# Patient Record
Sex: Female | Born: 1980 | Race: Black or African American | Hispanic: No | Marital: Single | Smoking: Former smoker
Health system: Southern US, Community
[De-identification: ages and names within clinical notes are randomized; demographics above are authoritative.]

## PROBLEM LIST (undated history)

## (undated) DIAGNOSIS — K589 Irritable bowel syndrome without diarrhea: Secondary | ICD-10-CM

## (undated) DIAGNOSIS — G43909 Migraine, unspecified, not intractable, without status migrainosus: Secondary | ICD-10-CM

## (undated) DIAGNOSIS — M797 Fibromyalgia: Secondary | ICD-10-CM

## (undated) DIAGNOSIS — J45909 Unspecified asthma, uncomplicated: Secondary | ICD-10-CM

## (undated) HISTORY — DX: Fibromyalgia: M79.7

## (undated) HISTORY — PX: OTHER SURGICAL HISTORY: SHX169

## (undated) HISTORY — PX: WISDOM TOOTH EXTRACTION: SHX21

---

## 1998-01-06 ENCOUNTER — Emergency Department (HOSPITAL_COMMUNITY): Admission: EM | Admit: 1998-01-06 | Discharge: 1998-01-06 | Payer: Self-pay | Admitting: Emergency Medicine

## 1998-02-24 ENCOUNTER — Emergency Department (HOSPITAL_COMMUNITY): Admission: EM | Admit: 1998-02-24 | Discharge: 1998-02-24 | Payer: Self-pay

## 1998-03-17 ENCOUNTER — Encounter: Admission: RE | Admit: 1998-03-17 | Discharge: 1998-03-17 | Payer: Self-pay | Admitting: Family Medicine

## 1998-05-01 ENCOUNTER — Emergency Department (HOSPITAL_COMMUNITY): Admission: EM | Admit: 1998-05-01 | Discharge: 1998-05-01 | Payer: Self-pay | Admitting: Emergency Medicine

## 1998-05-21 ENCOUNTER — Encounter: Admission: RE | Admit: 1998-05-21 | Discharge: 1998-05-21 | Payer: Self-pay | Admitting: Family Medicine

## 1998-07-23 ENCOUNTER — Encounter: Admission: RE | Admit: 1998-07-23 | Discharge: 1998-07-23 | Payer: Self-pay | Admitting: Family Medicine

## 1998-11-04 ENCOUNTER — Emergency Department (HOSPITAL_COMMUNITY): Admission: EM | Admit: 1998-11-04 | Discharge: 1998-11-04 | Payer: Self-pay | Admitting: Emergency Medicine

## 1998-11-12 ENCOUNTER — Emergency Department (HOSPITAL_COMMUNITY): Admission: EM | Admit: 1998-11-12 | Discharge: 1998-11-12 | Payer: Self-pay | Admitting: Emergency Medicine

## 1998-11-12 ENCOUNTER — Encounter: Admission: RE | Admit: 1998-11-12 | Discharge: 1998-11-12 | Payer: Self-pay | Admitting: Family Medicine

## 1998-12-03 ENCOUNTER — Encounter: Admission: RE | Admit: 1998-12-03 | Discharge: 1998-12-03 | Payer: Self-pay | Admitting: Family Medicine

## 1999-01-12 ENCOUNTER — Encounter: Admission: RE | Admit: 1999-01-12 | Discharge: 1999-01-12 | Payer: Self-pay | Admitting: Sports Medicine

## 1999-04-28 ENCOUNTER — Emergency Department (HOSPITAL_COMMUNITY): Admission: EM | Admit: 1999-04-28 | Discharge: 1999-04-28 | Payer: Self-pay | Admitting: Emergency Medicine

## 1999-05-05 ENCOUNTER — Encounter: Admission: RE | Admit: 1999-05-05 | Discharge: 1999-05-05 | Payer: Self-pay | Admitting: Family Medicine

## 1999-05-05 ENCOUNTER — Other Ambulatory Visit: Admission: RE | Admit: 1999-05-05 | Discharge: 1999-05-05 | Payer: Self-pay | Admitting: Family Medicine

## 1999-10-26 ENCOUNTER — Emergency Department (HOSPITAL_COMMUNITY): Admission: EM | Admit: 1999-10-26 | Discharge: 1999-10-26 | Payer: Self-pay | Admitting: Emergency Medicine

## 1999-12-15 ENCOUNTER — Emergency Department (HOSPITAL_COMMUNITY): Admission: EM | Admit: 1999-12-15 | Discharge: 1999-12-15 | Payer: Self-pay | Admitting: Emergency Medicine

## 2000-02-21 ENCOUNTER — Emergency Department (HOSPITAL_COMMUNITY): Admission: EM | Admit: 2000-02-21 | Discharge: 2000-02-21 | Payer: Self-pay | Admitting: Emergency Medicine

## 2000-03-31 ENCOUNTER — Encounter (INDEPENDENT_AMBULATORY_CARE_PROVIDER_SITE_OTHER): Payer: Self-pay | Admitting: *Deleted

## 2000-03-31 LAB — CONVERTED CEMR LAB

## 2000-04-21 ENCOUNTER — Encounter: Admission: RE | Admit: 2000-04-21 | Discharge: 2000-04-21 | Payer: Self-pay | Admitting: Family Medicine

## 2001-05-21 ENCOUNTER — Encounter: Admission: RE | Admit: 2001-05-21 | Discharge: 2001-05-21 | Payer: Self-pay | Admitting: Family Medicine

## 2001-06-22 ENCOUNTER — Encounter: Admission: RE | Admit: 2001-06-22 | Discharge: 2001-06-22 | Payer: Self-pay | Admitting: Family Medicine

## 2001-08-10 ENCOUNTER — Ambulatory Visit (HOSPITAL_COMMUNITY): Admission: RE | Admit: 2001-08-10 | Discharge: 2001-08-10 | Payer: Self-pay | Admitting: Gastroenterology

## 2006-01-31 HISTORY — PX: OTHER SURGICAL HISTORY: SHX169

## 2006-03-30 DIAGNOSIS — J309 Allergic rhinitis, unspecified: Secondary | ICD-10-CM | POA: Insufficient documentation

## 2006-03-30 DIAGNOSIS — N921 Excessive and frequent menstruation with irregular cycle: Secondary | ICD-10-CM

## 2006-03-30 DIAGNOSIS — N10 Acute tubulo-interstitial nephritis: Secondary | ICD-10-CM

## 2006-03-30 DIAGNOSIS — J45909 Unspecified asthma, uncomplicated: Secondary | ICD-10-CM | POA: Insufficient documentation

## 2006-03-30 DIAGNOSIS — K589 Irritable bowel syndrome without diarrhea: Secondary | ICD-10-CM

## 2006-03-30 DIAGNOSIS — N946 Dysmenorrhea, unspecified: Secondary | ICD-10-CM

## 2006-03-31 ENCOUNTER — Encounter (INDEPENDENT_AMBULATORY_CARE_PROVIDER_SITE_OTHER): Payer: Self-pay | Admitting: *Deleted

## 2014-06-30 ENCOUNTER — Emergency Department (HOSPITAL_COMMUNITY): Payer: Self-pay

## 2014-06-30 ENCOUNTER — Emergency Department (HOSPITAL_COMMUNITY)
Admission: EM | Admit: 2014-06-30 | Discharge: 2014-06-30 | Disposition: A | Discharge status: Home / Self Care | Payer: Self-pay | Attending: Emergency Medicine | Admitting: Emergency Medicine

## 2014-06-30 ENCOUNTER — Encounter (HOSPITAL_COMMUNITY): Payer: Self-pay | Admitting: Emergency Medicine

## 2014-06-30 DIAGNOSIS — Z3202 Encounter for pregnancy test, result negative: Secondary | ICD-10-CM | POA: Insufficient documentation

## 2014-06-30 DIAGNOSIS — J45901 Unspecified asthma with (acute) exacerbation: Secondary | ICD-10-CM | POA: Insufficient documentation

## 2014-06-30 DIAGNOSIS — Z79899 Other long term (current) drug therapy: Secondary | ICD-10-CM | POA: Insufficient documentation

## 2014-06-30 DIAGNOSIS — K589 Irritable bowel syndrome without diarrhea: Secondary | ICD-10-CM | POA: Insufficient documentation

## 2014-06-30 DIAGNOSIS — Z91040 Latex allergy status: Secondary | ICD-10-CM | POA: Insufficient documentation

## 2014-06-30 DIAGNOSIS — Z8679 Personal history of other diseases of the circulatory system: Secondary | ICD-10-CM | POA: Insufficient documentation

## 2014-06-30 HISTORY — DX: Migraine, unspecified, not intractable, without status migrainosus: G43.909

## 2014-06-30 HISTORY — DX: Irritable bowel syndrome, unspecified: K58.9

## 2014-06-30 HISTORY — DX: Unspecified asthma, uncomplicated: J45.909

## 2014-06-30 LAB — CBC WITH DIFFERENTIAL/PLATELET
Basophils Absolute: 0 10*3/uL (ref 0.0–0.1)
Basophils Relative: 1 % (ref 0–1)
Eosinophils Absolute: 0 10*3/uL (ref 0.0–0.7)
Eosinophils Relative: 1 % (ref 0–5)
HCT: 34.5 % — ABNORMAL LOW (ref 36.0–46.0)
Hemoglobin: 11.7 g/dL — ABNORMAL LOW (ref 12.0–15.0)
Lymphocytes Relative: 42 % (ref 12–46)
Lymphs Abs: 2.8 10*3/uL (ref 0.7–4.0)
MCH: 29.1 pg (ref 26.0–34.0)
MCHC: 33.9 g/dL (ref 30.0–36.0)
MCV: 85.8 fL (ref 78.0–100.0)
Monocytes Absolute: 0.5 10*3/uL (ref 0.1–1.0)
Monocytes Relative: 8 % (ref 3–12)
Neutro Abs: 3.2 10*3/uL (ref 1.7–7.7)
Neutrophils Relative %: 48 % (ref 43–77)
Platelets: 211 10*3/uL (ref 150–400)
RBC: 4.02 MIL/uL (ref 3.87–5.11)
RDW: 12.4 % (ref 11.5–15.5)
WBC: 6.6 10*3/uL (ref 4.0–10.5)

## 2014-06-30 LAB — URINALYSIS, ROUTINE W REFLEX MICROSCOPIC
Bilirubin Urine: NEGATIVE
Glucose, UA: NEGATIVE mg/dL
Hgb urine dipstick: NEGATIVE
Ketones, ur: NEGATIVE mg/dL
Leukocytes, UA: NEGATIVE
Nitrite: NEGATIVE
Protein, ur: NEGATIVE mg/dL
Specific Gravity, Urine: 1.005 (ref 1.005–1.030)
Urobilinogen, UA: 0.2 mg/dL (ref 0.0–1.0)
pH: 7 (ref 5.0–8.0)

## 2014-06-30 LAB — COMPREHENSIVE METABOLIC PANEL
ALT: 32 U/L (ref 14–54)
AST: 26 U/L (ref 15–41)
Albumin: 3.8 g/dL (ref 3.5–5.0)
Alkaline Phosphatase: 46 U/L (ref 38–126)
Anion gap: 8 (ref 5–15)
BUN: 7 mg/dL (ref 6–20)
CO2: 22 mmol/L (ref 22–32)
Calcium: 8.8 mg/dL — ABNORMAL LOW (ref 8.9–10.3)
Chloride: 109 mmol/L (ref 101–111)
Creatinine, Ser: 0.67 mg/dL (ref 0.44–1.00)
GFR calc Af Amer: >60 mL/min (ref >60)
GFR calc non Af Amer: >60 mL/min (ref >60)
Glucose, Bld: 123 mg/dL — ABNORMAL HIGH (ref 65–99)
Potassium: 4 mmol/L (ref 3.5–5.1)
Sodium: 139 mmol/L (ref 135–145)
Total Bilirubin: 0.2 mg/dL — ABNORMAL LOW (ref 0.3–1.2)
Total Protein: 7 g/dL (ref 6.5–8.1)

## 2014-06-30 LAB — PREGNANCY, URINE: Preg Test, Ur: NEGATIVE

## 2014-06-30 LAB — LIPASE, BLOOD: Lipase: 18 U/L — ABNORMAL LOW (ref 22–51)

## 2014-06-30 MED ORDER — PREDNISONE 10 MG PO TABS: 20.0000 mg | ORAL_TABLET | Freq: Every day | ORAL | Status: DC

## 2014-06-30 MED ORDER — ALBUTEROL SULFATE (2.5 MG/3ML) 0.083% IN NEBU
5.0000 mg | INHALATION_SOLUTION | Freq: Once | RESPIRATORY_TRACT | Status: AC
  Administered 2014-06-30: 5 mg via RESPIRATORY_TRACT
  Filled 2014-06-30: qty 6

## 2014-06-30 MED ORDER — HYDROCODONE-HOMATROPINE 5-1.5 MG/5ML PO SYRP: 5.0000 mL | ORAL_SOLUTION | Freq: Four times a day (QID) | ORAL | Status: DC | PRN

## 2014-06-30 MED ORDER — PREDNISONE 20 MG PO TABS
60.0000 mg | ORAL_TABLET | Freq: Once | ORAL | Status: AC
  Administered 2014-06-30: 60 mg via ORAL
  Filled 2014-06-30: qty 3

## 2014-06-30 NOTE — ED Notes (Signed)
Pt c/o cough x 3 days. Pt has hx of asthma and has been using her nebulizer at home. Pt A&Ox4 and ambulatory. Denies chest pain, denies productive cough. Pt also c/o N/V and diarrhea. Pt also c/o generalized abdominal pain.

## 2014-06-30 NOTE — Progress Notes (Signed)
EDCM spoke to patient at bedside. Patient confirms she does not have a pcp or insurance living in [place].  EDCM provided patient with pamphlet to [facility], informed patient of services there and walk in times.  EDCM also provided patient with list of pcps who accept self pay patients, list of discount pharmacies and websites needymeds.org and GoodRX.com for medication assistance, phone number to inquire about the orange card, phone number to inquire about Mediciad, phone number to inquire about the Affordable Care Act, financial resources in the community such as local churches, salvation army, urban ministries, and dental assistance for uninsured patients.  Patient thankful for resources.  No further EDCM needs at this time. 

## 2014-06-30 NOTE — Discharge Instructions (Signed)
Asthma °Asthma is a recurring condition in which the airways tighten and narrow. Asthma can make it difficult to breathe. It can cause coughing, wheezing, and shortness of breath. Asthma episodes, also called asthma attacks, range from minor to life-threatening. Asthma cannot be cured, but medicines and lifestyle changes can help control it. °CAUSES °Asthma is believed to be caused by inherited (genetic) and environmental factors, but its exact cause is unknown. Asthma may be triggered by allergens, lung infections, or irritants in the air. Asthma triggers are different for each person. Common triggers include:  °· Animal dander. °· Dust mites. °· Cockroaches. °· Pollen from trees or grass. °· Mold. °· Smoke. °· Air pollutants such as dust, household cleaners, hair sprays, aerosol sprays, paint fumes, strong chemicals, or strong odors. °· Cold air, weather changes, and winds (which increase molds and pollens in the air). °· Strong emotional expressions such as crying or laughing hard. °· Stress. °· Certain medicines (such as aspirin) or types of drugs (such as beta-blockers). °· Sulfites in foods and drinks. Foods and drinks that may contain sulfites include dried fruit, potato chips, and sparkling grape juice. °· Infections or inflammatory conditions such as the flu, a cold, or an inflammation of the nasal membranes (rhinitis). °· Gastroesophageal reflux disease (GERD). °· Exercise or strenuous activity. °SYMPTOMS °Symptoms may occur immediately after asthma is triggered or many hours later. Symptoms include: °· Wheezing. °· Excessive nighttime or early morning coughing. °· Frequent or severe coughing with a common cold. °· Chest tightness. °· Shortness of breath. °DIAGNOSIS  °The diagnosis of asthma is made by a review of your medical history and a physical exam. Tests may also be performed. These may include: °· Lung function studies. These tests show how much air you breathe in and out. °· Allergy  tests. °· Imaging tests such as X-rays. °TREATMENT  °Asthma cannot be cured, but it can usually be controlled. Treatment involves identifying and avoiding your asthma triggers. It also involves medicines. There are 2 classes of medicine used for asthma treatment:  °· Controller medicines. These prevent asthma symptoms from occurring. They are usually taken every day. °· Reliever or rescue medicines. These quickly relieve asthma symptoms. They are used as needed and provide short-term relief. °Your health care provider will help you create an asthma action plan. An asthma action plan is a written plan for managing and treating your asthma attacks. It includes a list of your asthma triggers and how they may be avoided. It also includes information on when medicines should be taken and when their dosage should be changed. An action plan may also involve the use of a device called a peak flow meter. A peak flow meter measures how well the lungs are working. It helps you monitor your condition. °HOME CARE INSTRUCTIONS  °· Take medicines only as directed by your health care provider. Speak with your health care provider if you have questions about how or when to take the medicines. °· Use a peak flow meter as directed by your health care provider. Record and keep track of readings. °· Understand and use the action plan to help minimize or stop an asthma attack without needing to seek medical care. °· Control your home environment in the following ways to help prevent asthma attacks: °¨ Do not smoke. Avoid being exposed to secondhand smoke. °¨ Change your heating and air conditioning filter regularly. °¨ Limit your use of fireplaces and wood stoves. °¨ Get rid of pests (such as roaches and   mice) and their droppings.  Throw away plants if you see mold on them.  Clean your floors and dust regularly. Use unscented cleaning products.  Try to have someone else vacuum for you regularly. Stay out of rooms while they are  being vacuumed and for a short while afterward. If you vacuum, use a dust mask from a hardware store, a double-layered or microfilter vacuum cleaner bag, or a vacuum cleaner with a HEPA filter.  Replace carpet with wood, tile, or vinyl flooring. Carpet can trap dander and dust.  Use allergy-proof pillows, mattress covers, and box spring covers.  Wash bed sheets and blankets every week in hot water and dry them in a dryer.  Use blankets that are made of polyester or cotton.  Clean bathrooms and kitchens with bleach. If possible, have someone repaint the walls in these rooms with mold-resistant paint. Keep out of the rooms that are being cleaned and painted.  Wash hands frequently. SEEK MEDICAL CARE IF:   You have wheezing, shortness of breath, or a cough even if taking medicine to prevent attacks.  The colored mucus you cough up (sputum) is thicker than usual.  Your sputum changes from clear or white to yellow, green, gray, or bloody.  You have any problems that may be related to the medicines you are taking (such as a rash, itching, swelling, or trouble breathing).  You are using a reliever medicine more than 2-3 times per week.  Your peak flow is still at 50-79% of your personal best after following your action plan for 1 hour.  You have a fever. SEEK IMMEDIATE MEDICAL CARE IF:   You seem to be getting worse and are unresponsive to treatment during an asthma attack.  You are short of breath even at rest.  You get short of breath when doing very little physical activity.  You have difficulty eating, drinking, or talking due to asthma symptoms.  You develop chest pain.  You develop a fast heartbeat.  You have a bluish color to your lips or fingernails.  You are light-headed, dizzy, or faint.  Your peak flow is less than 50% of your personal best. MAKE SURE YOU:   Understand these instructions.  Will watch your condition.  Will get help right away if you are not  doing well or get worse. Document Released: 01/17/2005 Document Revised: 06/03/2013 Document Reviewed: 08/16/2012 [facility] Patient Information 2015 [place], [place]. This information is not intended to replace advice given to you by your health care provider. Make sure you discuss any questions you have with your health care provider. Emergency Department Resource Guide 1) Find a Doctor and Pay Out of Pocket Although you won't have to find out who is covered by your insurance plan, it is a good idea to ask around and get recommendations. You will then need to call the office and see if the doctor you have chosen will accept you as a new patient and what types of options they offer for patients who are self-pay. Some doctors offer discounts or will set up payment plans for their patients who do not have insurance, but you will need to ask so you aren't surprised when you get to your appointment.  2) Contact Your Local Health Department Not all health departments have doctors that can see patients for sick visits, but many do, so it is worth a call to see if yours does. If you don't know where your local health department is, you can check in your phone  book. The CDC also has a tool to help you locate your state's health department, and many state websites also have listings of all of their local health departments.  3) Find a Walk-in Clinic If your illness is not likely to be very severe or complicated, you may want to try a walk in clinic. These are popping up all over the country in pharmacies, drugstores, and shopping centers. They're usually staffed by nurse practitioners or physician assistants that have been trained to treat common illnesses and complaints. They're usually fairly quick and inexpensive. However, if you have serious medical issues or chronic medical problems, these are probably not your best option.   Chronic Pain Problems: Organization         Address     Phone              Notes  [facility]  [phone] Patients need to be referred by their primary care doctor.   Medication Assistance: Organization         Address     Phone             Notes  [facility] Medication [facility] [address] [place], [place] [address] [phone] --Must be a resident of [facility] -- Must have NO insurance coverage whatsoever (no Medicaid/ Medicare, etc.) -- The pt. MUST have a primary care doctor that directs their care regularly and follows them in the community   [facility]  [phone]   [employer]  [phone]    Agencies that provide inexpensive medical care: Organization         Address     Phone             Notes  [facility]  [phone]   [facility]    [phone]   [facility] [address] [place], [place] [address] [phone]   [facility] [address] [place] [place] [phone]   Planned Parenthood    [phone]   [facility]    [phone]   Community Health and [facility]  [address], [place] Phone:  [phone], Fax:  [phone] Hours of Operation:  9 am - 6 pm, M-F.  Also accepts Medicaid/Medicare and self-pay.  [facility]  [address], [place] Phone: [phone], Fax: [phone]. Hours of Operation:  8:30 am - 5:30 pm, M-F.  Also accepts Medicaid and self-pay.  [facility] [place] [address] [place] Phone: [phone]   [facility]     [address] Natasha Bence [place], [place] [phone], Ext. [phone] Mondays & Thursdays: 7-9 AM.  First 15 patients are seen on a first come, first serve basis.   [facility] [address] [place] [address] [phone] Accepts Medicaid   Medicaid-accepting [facility] Providers:  Organization         Address     Phone              Notes  [facility] [address], [place] [phone] Also accepts self-pay patients.  [facility] [address] Laurell Josephs [place], [place]  [phone]   [facility] [address], [place] [phone]   [facility] [address], [place] [phone]) [phone]   Renaye RakersVeita Bland [address], [place]   [phone] Only accepts [place] Access [place] patients after they have their name applied to their card.   Self-Pay (no insurance) in [place]:  Organization         Address     Phone             Notes  Sickle Cell Patients, [facility] [address], [place] [phone]   [facility] [address], [place] [phone]   [facility] [place]  [address],  [phone]   [facility]/Dr. Osei-Bonsu  [address] [place] [address], [place] or [phone], [place] [phone] Phone number for both [place] and [place] locations is the same.  [facility]gent [facility] and [facility] [address], [place] [phone]   [facility] [place] [address] [place] [address], [place] or [address] [phone]   [facility] [address], [place] [phone]) [phone], phone; [phone]) [phone], fax Sees patients 1st and 3rd Saturday of every month.  Must not qualify for public or private insurance (i.e. Medicaid, Medicare, [employer] Health Choice, Veterans' Benefits)  Household income should be no more than 200% of the poverty level The clinic cannot treat you if you are pregnant or think you are pregnant  Sexually transmitted diseases are not treated at the clinic.    Dental Care:  Organization         Address     Phone             Notes  [facility] [address], [place] [phone] Accepts children up to age 36 who are enrolled in [place] or [place] Health Choice; pregnant women with a Medicaid card; and children who have applied for Medicaid or [place] Health Choice, but were declined, whose parents can pay a reduced fee at time of service.  [facility] [place]  [address], [place] [phone] Accepts children up to age 36 who are enrolled in [place] or [place] Health Choice; pregnant women with a Medicaid card; and children who have applied for Medicaid or [place] Health Choice, but were declined, whose parents can pay a reduced fee at time of service.  [facility]  [address], [place] [phone] Patients are seen by appointment only. Walk-ins are not accepted. [facility] will see patients 36 years of age and older. Monday - Tuesday (8am-5pm) Most Wednesdays (8:30-5pm) $30 per visit, cash only  [facility]  [address], [facility] [phone] Patients are seen by appointment only. Walk-ins are not accepted. [facility] will see patients 36 years of age and older. One Wednesday Evening (Monthly: Volunteer Based).  $30 per visit, cash only  [facility]  [phone] for adults; Children under age 854, call Graduate Pediatric Dentistry at [phone]. Children aged 214-14, please call [phone] to request a pediatric application.  Dental services are provided in all areas of dental care including fillings, crowns and bridges, complete and partial dentures, implants, gum treatment, root canals, and extractions. Preventive care is also provided. Treatment is provided to both adults and children. Patients are selected via a lottery  and there is often a waiting list.   [facility] [address], [place]  [phone] [email or web address]   [facility] [address]  [place], [place] [phone], Ext. [phone] Second and Fourth Thursday of each month, opens at 6:30 AM; Clinic ends at 9 AM.  Patients are seen on a first-come first-served basis, and a limited number are seen during each clinic.   [facility]  [address] Ether Griffins [place], [place] [phone]   Eligibility Requirements You must have lived in [place], [place], or [place] counties for at least the last three months.   You cannot be eligible for state or federal sponsored [place], including [employer], [place], or [employer].   You generally cannot be eligible for healthcare insurance through your employer.    How to apply: Eligibility screenings are held every Tuesday and Wednesday afternoon from 1:00 pm until 4:00 pm. You do not need an appointment for the interview!  [facility] [address] [place], [place] [phone]   [facility] Health Department  [phone]   [facility] Health Department  [phone]   [facility] Health Department  [phone]    Behavioral Health Resources in the Community: Intensive Outpatient Programs Organization         Address     Phone             Notes  [facility] [address] [place], [place], [place] [phone]   [facility] Outpatient [address], [place], [place] [phone]   [facility]: [facility] [address] [place], [place]  [phone]   [facility] Mental Health [address]  [place], [place] [phone] or [phone]     Substance Abuse Resources Organization         Address     Phone             Notes  Al[facility] Drug Services  [phone]   [facility]  [phone]   [facility] [place]  [phone]   Floydene Flock  [phone]   Residential & Outpatient Substance Abuse Program  [phone]   Psychological Services Organization         Address     Phone             Notes  [facility] Behavioral  Health  [phone]   [facility]  [phone]   [facility] [address], [place] [phone] or [phone]    Mobile Crisis Teams Organization         Address     Phone             Notes  [facility], Mobile Crisis Care Unit  [phone]   [facility]  [address] [place], [place] [phone]   Doristine Locks [address] [place] [phone]    Self-Help/Support Groups Organization         Address     Phone             Notes  [facility] [place] - variety of support groups  336- I7437963 Call for more information  Narcotics Anonymous (NA), [facility] [address], [employer] [place]  2 meetings at this location   Secretary/administrator  Notes  [facility] [address],    [place]  [phone]   [facility]  [address] [place] [address], [place], [place] [phone]   [facility] [address] [place], [place] [phone] Admissions: 8am-3pm M-F  [facility] [address],    [place], [place] [phone]   [facility] [address] [place], [place], [place] [phone]   [facility] [address],  [place], [place] [phone]   [facility] - Intensive Outpatient [address] Laurell Josephs [address], [place], [place] [phone]   [facility] (Addiction [facility] [address] [place]  [place], [place] [phone] or [phone]   [facility] (RTS) [address] [place], [place] [phone] Accepts Medicaid  [facility] [place] [address]  [place] [phone] Substance Abuse/Addiction Treatment   [facility] Organization         Address     Phone             Notes  [facility]  [phone]   Angie Fava, PhD  [address] Ervin Knack [place], [place]   [phone] or [phone]   [facility] Behavioral   [address] [place], [place] [phone]   [facility] [address], [place], [place] [phone] Insurance/Medicaid/sponsorship through [facility] [address]                                    [place], [place] [phone] Therapy/tele-psych/case  [facility] [address], [place] [phone]    Dr. Lolly Mustache  [phone]   [facility]  [employer] [facility] Dept. 1) [address] [place] 2) [address] [place] 3)  [address], [place] [phone]  [phone]   [facility] Child Abuse Hotline 224-795-8619 or 504-679-6115 (After Hours)     [facility] Resources: Abuse and Neglect Organization         Address     Phone             Notes  Child/Elder Abuse Hotline  438-675-3213   Family Abuse Services  (270)220-5296 24 hour crisis line  [facility]  [phone]   [facility] Domestic Violence Hotline  [phone]    Behavioral Health & Substance Use Organization         Address     Phone             Notes  [employer]   947-458-1010 24 hour crisis line  Advance Access  [address], [place] [phone] Monday- Friday, walk-in,  8am-8pm  RTSA Detoxification & Crisis Stabilization  [phone])[phone]   Alcoholics Anonymous [phone] [place] [employer]  Narcotics Anonymous  [phone]     Health Clinics & Urgent Care Centers Organization         Address     Phone             Notes  [facility] Department  [phone]   [facility]  [phone]   [facility]  [phone]   [facility]  [phone] Uninsured patients meeting eligibility requirement  [facility]  [facility]  [facility]  [phone]      [facility]     Health Center  [phone]      [facility]  [phone]      [facility]   [phone] [facility]     [facility]    Center  [phone]     Additional [facility] Resources Organization         Address     Phone             Notes  [facility]  [phone]   [place]  034-742-5956 Medicaid, Nutrition, Medicine Assistance, Utility Assistance  [facility] [phone]   [place] [facility]  [phone]   [facility]  985-800-4138 [facility] Shelter  [facility] [address]  808-444-3449 Adult & family shelter, food, utility & rent assistance  24 Hour crisis line for those facing homelessness  438-170-8210   [facility] Transit  7324357150 [place], [facility] public transportation system  Homecare Providers  (365) [phone] HIV/AIDS Case Management, FREE HIV SCREEN  Medication Management  (726)783-2007 Ongoing medication assistance for patients meeting eligibility requirements  Medication Drop Box Locations: [place] Police Dept., [facility] Police Dept., [employer] Police Dept., [facility] office  Safely rid of unused medications  [employer]  [phone] Crisis assistance, medication, housing, food, utility assistance  [employer] Info.  [facility])  [phone]

## 2014-06-30 NOTE — ED Provider Notes (Signed)
CSN: [id number]     Arrival date & time 06/30/14  1845 History   First MD Initiated Contact with Patient 06/30/14 1946     Chief Complaint  Patient presents with  . Cough  . Emesis     (Consider location/radiation/quality/duration/timing/severity/associated sxs/prior Treatment) HPI Comments: Patient presents with cough. She has a history of asthma. She states for last 3 days she's had a worsening dry cough. She states the cough keeps her awake at night. She's had some clear rhinorrhea. She denies any fevers. She denies any chest pain. She does have some wheezing and shortness of breath consistent with her asthma exacerbations. She's been using her nebulizer treatment about every 4 hours without improvement of symptoms. She denies any leg pain or swelling. She does not currently have a PCP as she recently moved here from [place].  Patient is a 34 y.o. female presenting with cough and vomiting.  Cough Associated symptoms: rhinorrhea, shortness of breath and wheezing   Associated symptoms: no chest pain, no chills, no diaphoresis, no fever, no headaches and no rash   Emesis Associated symptoms: no abdominal pain, no arthralgias, no chills, no diarrhea and no headaches     Past Medical History  Diagnosis Date  . Asthma   . IBS (irritable bowel syndrome)   . Migraine    History reviewed. No pertinent past surgical history. No family history on file. History  Substance Use Topics  . Smoking status: Never Smoker   . Smokeless tobacco: Not on file  . Alcohol Use: Yes   OB History    No data available     Review of Systems  Constitutional: Positive for fatigue. Negative for fever, chills and diaphoresis.  HENT: Positive for rhinorrhea. Negative for congestion and sneezing.   Eyes: Negative.   Respiratory: Positive for cough, shortness of breath and wheezing. Negative for chest tightness.   Cardiovascular: Negative for chest pain and leg swelling.  Gastrointestinal:  Positive for vomiting. Negative for nausea, abdominal pain, diarrhea and blood in stool.  Genitourinary: Negative for frequency, hematuria, flank pain and difficulty urinating.  Musculoskeletal: Negative for back pain and arthralgias.  Skin: Negative for rash.  Neurological: Negative for dizziness, speech difficulty, weakness, numbness and headaches.      Allergies  Asa and Latex  Home Medications   Prior to Admission medications   Medication Sig Start Date End Date Taking? Authorizing Provider  albuterol (PROVENTIL) (2.5 MG/3ML) 0.083% nebulizer solution Take 2.5 mg by nebulization every 6 (six) hours as needed for wheezing or shortness of breath (wheezing and sob).   Yes Historical Provider, MD  dextromethorphan-guaiFENesin (MUCINEX DM) 30-600 MG per 12 hr tablet Take 1 tablet by mouth every 4 (four) hours as needed for cough (cough).   Yes Historical Provider, MD  escitalopram (LEXAPRO) 10 MG tablet Take 10 mg by mouth daily.   Yes Historical Provider, MD  pantoprazole (PROTONIX) 40 MG tablet Take 40 mg by mouth daily.   Yes Historical Provider, MD  HYDROcodone-homatropine (HYCODAN) 5-1.5 MG/5ML syrup Take 5 mLs by mouth every 6 (six) hours as needed for cough. 06/30/14   Rolan BuccoMelanie Kaydance Bowie, MD  predniSONE (DELTASONE) 10 MG tablet Take 2 tablets (20 mg total) by mouth daily. 06/30/14   Rolan BuccoMelanie Maxmilian Trostel, MD   BP 150/86 mmHg  Pulse 91  Temp(Src) 98.3 F (36.8 C) (Oral)  Resp 16  SpO2 99%  LMP 06/03/2014 Physical Exam  Constitutional: She is oriented to person, place, and time. She appears well-developed and  well-nourished.  HENT:  Head: Normocephalic and atraumatic.  Eyes: Pupils are equal, round, and reactive to light.  Neck: Normal range of motion. Neck supple.  Cardiovascular: Normal rate, regular rhythm and normal heart sounds.   Pulmonary/Chest: Effort normal. No respiratory distress. She has wheezes (mild expiratory wheezing bilaterally). She has no rales. She exhibits no  tenderness.  Abdominal: Soft. Bowel sounds are normal. There is no tenderness. There is no rebound and no guarding.  Musculoskeletal: Normal range of motion. She exhibits no edema.  No edema or calf tenderness  Lymphadenopathy:    She has no cervical adenopathy.  Neurological: She is alert and oriented to person, place, and time.  Skin: Skin is warm and dry. No rash noted.  Psychiatric: She has a normal mood and affect.    ED Course  Procedures (including critical care time) Labs Review Labs Reviewed  CBC WITH DIFFERENTIAL/PLATELET - Abnormal; Notable for the following:    Hemoglobin 11.7 (*)    HCT 34.5 (*)    All other components within normal limits  COMPREHENSIVE METABOLIC PANEL - Abnormal; Notable for the following:    Glucose, Bld 123 (*)    Calcium 8.8 (*)    Total Bilirubin 0.2 (*)    All other components within normal limits  LIPASE, BLOOD - Abnormal; Notable for the following:    Lipase 18 (*)    All other components within normal limits  URINALYSIS, ROUTINE W REFLEX MICROSCOPIC (NOT AT [facility])  PREGNANCY, URINE    Imaging Review Dg Chest 2 View  06/30/2014   CLINICAL DATA:  Cough x4 days  EXAM: CHEST  2 VIEW  COMPARISON:  None.  FINDINGS: Lungs are clear.  No pleural effusion or pneumothorax.  The heart is normal in size.  Visualized osseous structures are within normal limits.  IMPRESSION: Normal chest radiographs.   Electronically Signed   By: Charline Bills M.D.   On: 06/30/2014 21:07     EKG Interpretation None      MDM   Final diagnoses:  Asthma exacerbation    Patient was given nebulizer treatment in the ED. She was given dose of prednisone. Her lungs are clear. Her chest x-rays negative for pneumonia. She has no other symptoms suggestive of pulmonary embolus. She was discharged home in good condition. She was started on a five-day course of prednisone. She can continue to use her nebulizer treatments at home and was given a prescription for Hycodan  cough syrup. She was encouraged to obtain primary care follow-up. She was given a Facilities manager for outpatient follow-up.    Rolan Bucco, MD 06/30/14 2117

## 2014-07-28 ENCOUNTER — Ambulatory Visit: Payer: Self-pay | Attending: Internal Medicine

## 2015-10-28 ENCOUNTER — Other Ambulatory Visit: Payer: Self-pay | Admitting: Obstetrics & Gynecology

## 2015-10-28 DIAGNOSIS — N644 Mastodynia: Secondary | ICD-10-CM

## 2015-11-09 ENCOUNTER — Ambulatory Visit
Admission: RE | Admit: 2015-11-09 | Discharge: 2015-11-09 | Disposition: A | Discharge status: Home / Self Care | Payer: BLUE CROSS/BLUE SHIELD | Source: Ambulatory Visit | Attending: Obstetrics & Gynecology | Admitting: Obstetrics & Gynecology

## 2015-11-09 DIAGNOSIS — N644 Mastodynia: Secondary | ICD-10-CM

## 2015-11-12 ENCOUNTER — Ambulatory Visit: Payer: Self-pay | Admitting: Neurology

## 2015-12-15 ENCOUNTER — Ambulatory Visit: Payer: Self-pay | Admitting: Neurology

## 2016-06-25 ENCOUNTER — Encounter (HOSPITAL_COMMUNITY): Payer: Self-pay | Admitting: *Deleted

## 2016-06-25 ENCOUNTER — Emergency Department (HOSPITAL_COMMUNITY)
Admission: EM | Admit: 2016-06-25 | Discharge: 2016-06-25 | Disposition: A | Discharge status: Home / Self Care | Payer: BLUE CROSS/BLUE SHIELD | Attending: Physician Assistant | Admitting: Physician Assistant

## 2016-06-25 DIAGNOSIS — Z91040 Latex allergy status: Secondary | ICD-10-CM | POA: Insufficient documentation

## 2016-06-25 DIAGNOSIS — Z79899 Other long term (current) drug therapy: Secondary | ICD-10-CM | POA: Insufficient documentation

## 2016-06-25 DIAGNOSIS — K0889 Other specified disorders of teeth and supporting structures: Secondary | ICD-10-CM

## 2016-06-25 DIAGNOSIS — J45909 Unspecified asthma, uncomplicated: Secondary | ICD-10-CM | POA: Insufficient documentation

## 2016-06-25 MED ORDER — IBUPROFEN 800 MG PO TABS: 800.0000 mg | ORAL_TABLET | Freq: Three times a day (TID) | ORAL | 0 refills | Status: DC

## 2016-06-25 MED ORDER — BUPIVACAINE-EPINEPHRINE 0.25% -1:200000 IJ SOLN
10.0000 mL | Freq: Once | INTRAMUSCULAR | Status: DC
  Filled 2016-06-25: qty 10

## 2016-06-25 MED ORDER — BUPIVACAINE-EPINEPHRINE (PF) 0.5% -1:200000 IJ SOLN
1.8000 mL | Freq: Once | INTRAMUSCULAR | Status: AC
  Administered 2016-06-25: 1.8 mL
  Filled 2016-06-25: qty 1.8

## 2016-06-25 NOTE — ED Notes (Signed)
Declined W/C at D/C and was escorted to lobby by RN. 

## 2016-06-25 NOTE — ED Triage Notes (Signed)
To ED for eval of left lower tooth pain. States she states has a cracked tooth. States pain started Wednesday but thought pain would get better. No swelling noted. Pt tearful.

## 2016-06-25 NOTE — ED Provider Notes (Signed)
[facility]-EMERGENCY DEPT Provider Note   CSN: [id number] Arrival date & time: 06/25/16  1103  By signing my name below, I, Melissa [name], attest that this documentation has been prepared under the direction and in the presence of Melissa [name]. Melissa Haberl, PA-C. Electronically Signed: Diona BrownerJennifer [name], ED Scribe. 06/25/16. 11:50 AM.  History   Chief Complaint Chief Complaint  Patient presents with  . Dental Pain    HPI Melissa [name] is [name] 36 y.o. female with [name] PMHx of IBS, asthma and migraines who presents to the Emergency Department complaining of gradually worsening, piercing, constant, left upper tooth pain that started on Wednesday, 06/22/16. Might have bitten into something, but is not sure exactly. Pt states she has [name] cracked tooth. No modifying factors. She hasn't eaten much since onset. She was supposed to have it pulled by her dentist, but she wanted to let the right side of her mouth heal from all the dental work before fixing the left side. Associated sx include sleep disturbance, left sided facial pain, and chills. No swelling. She tried taking ibuprofen with no relief. Pt takes escitalopram and supplements at home. She is allergic to latex and Asprin. Pt denies facial swelling, HA, and fever.  The history is provided by the patient. No language interpreter was used.    Past Medical History:  Diagnosis Date  . Asthma   . IBS (irritable bowel syndrome)   . Migraine     Patient Active Problem List   Diagnosis Date Noted  . RHINITIS, ALLERGIC 03/30/2006  . ASTHMA, UNSPECIFIED 03/30/2006  . IRRITABLE BOWEL SYNDROME 03/30/2006  . PYELONEPHRITIS, ACUTE 03/30/2006  . MENSTRUATION, PAINFUL 03/30/2006  . METRORRHAGIA 03/30/2006    History reviewed. No pertinent surgical history.  OB History    No data available       Home Medications    Prior to Admission medications   Medication Sig Start Date End Date Taking? Authorizing Provider  albuterol (PROVENTIL) (2.5 MG/3ML) 0.083%  nebulizer solution Take 2.5 mg by nebulization every 6 (six) hours as needed for wheezing or shortness of breath (wheezing and sob).    [provider]  dextromethorphan-guaiFENesin (MUCINEX DM) 30-600 MG per 12 hr tablet Take 1 tablet by mouth every 4 (four) hours as needed for cough (cough).    [provider]  escitalopram (LEXAPRO) 10 MG tablet Take 10 mg by mouth daily.    [provider]  HYDROcodone-homatropine (HYCODAN) 5-1.5 MG/5ML syrup Take 5 mLs by mouth every 6 (six) hours as needed for cough. 06/30/14   Rolan BuccoBelfi, Melanie, MD  ibuprofen (ADVIL,MOTRIN) 800 MG tablet Take 1 tablet (800 mg total) by mouth 3 (three) times daily. 06/25/16   Melissa Whan A, PA-C  pantoprazole (PROTONIX) 40 MG tablet Take 40 mg by mouth daily.    [provider]  predniSONE (DELTASONE) 10 MG tablet Take 2 tablets (20 mg total) by mouth daily. 06/30/14   Rolan BuccoBelfi, Melanie, MD    Family History No family history on file.  Social History Social History  Substance Use Topics  . Smoking status: Never Smoker  . Smokeless tobacco: Never Used  . Alcohol use Yes     Allergies   Asa [aspirin] and Latex   Review of Systems Review of Systems  Constitutional: Positive for chills. Negative for activity change and fever.  HENT: Positive for dental problem. Negative for ear pain and facial swelling.   Respiratory: Negative for shortness of breath.   Cardiovascular: Negative for chest pain.  Gastrointestinal: Negative for  abdominal pain.  Musculoskeletal: Negative for back pain.  Skin: Negative for rash.  Neurological: Negative for headaches.  Psychiatric/Behavioral: Positive for sleep disturbance.    Physical Exam Updated Vital Signs BP (!) 143/106 (BP Location: Left Arm)   Pulse 99   Temp 99.1 F (37.3 C) (Oral)   Resp 20   Ht 5\' 7"  (1.702 m)   Wt 76.2 kg (168 lb)   SpO2 99%   BMI 26.31 kg/m   Physical Exam  Constitutional: No distress.  HENT:  Head:  Normocephalic.  Mouth/Throat: Oropharynx is clear and moist.    Eyes: Conjunctivae are normal.  Neck: Neck supple.  Cardiovascular: Normal rate, regular rhythm and normal heart sounds.  Exam reveals no gallop and no friction rub.   No murmur heard. Pulmonary/Chest: Effort normal and breath sounds normal. No respiratory distress.  Abdominal: Soft. She exhibits no distension.  Neurological: She is alert.  Skin: Skin is warm. No rash noted.  Psychiatric: Her behavior is normal.  Nursing note and vitals reviewed.    ED Treatments / Results  DIAGNOSTIC STUDIES: Oxygen Saturation is 99% on RA, normal by my interpretation.   COORDINATION OF CARE: 11:49 AM-Discussed next steps with pt which includes taking ibuprofen and trying [name] dental block. Pt is to follow up with dentist. Pt verbalized understanding and is agreeable with the plan.   Labs (all labs ordered are listed, but only abnormal results are displayed) Labs Reviewed - No data to display  EKG  EKG Interpretation None       Radiology No results found.  Procedures Dental Block Date/Time: 06/25/2016 12:38 PM Performed by: Melissa [name], Melissa [name] Authorized by: Melissa [name]   Consent:    Consent obtained:  Verbal   Consent given by:  Patient   Risks discussed:  Allergic reaction, unsuccessful block, pain and swelling   Alternatives discussed:  No treatment and referral Indications:    Indications: dental pain   Location:    Anesthesia block type: infraorbital. Procedure details (see MAR for exact dosages):    Needle gauge:  25 G   Anesthetic injected:  Bupivacaine 0.5% WITH epi   Injection procedure:  Anatomic landmarks identified, introduced needle, incremental injection, negative aspiration for blood and anatomic landmarks palpated Post-procedure details:    Outcome:  Anesthesia achieved   Patient tolerance of procedure:  Tolerated well, no immediate complications    (including critical care time)  Medications  Ordered in ED Medications  bupivacaine-epinephrine (MARCAINE W/ EPI) 0.5% -1:200000 injection 1.8 mL (1.8 mLs Infiltration Given 06/25/16 1208)     Initial Impression / Assessment and Plan / ED Course  I have reviewed the triage vital signs and the nursing notes.  Pertinent labs & imaging results that were available during my care of the patient were reviewed by me and considered in my medical decision making (see chart for details).     Patient with toothache.  No gross abscess.  Exam unconcerning for Ludwig's angina or spread of infection.  Dental block successfully performed. Antibiotics are not indicated at this time. Urged patient to follow-up with dentist.    Final Clinical Impressions(s) / ED Diagnoses   Final diagnoses:  Pain, dental    New Prescriptions Discharge Medication List as of 06/25/2016 12:36 PM    START taking these medications   Details  ibuprofen (ADVIL,MOTRIN) 800 MG tablet Take 1 tablet (800 mg total) by mouth 3 (three) times daily., Starting Sat 06/25/2016, Print       I personally  performed the services described in this documentation, which was scribed in my presence. The recorded information has been reviewed and is accurate.     Barkley Boards, PA-C 06/28/16 1140    Abelino Derrick, MD 06/30/16 [phone]

## 2016-06-25 NOTE — Discharge Instructions (Signed)
Please call Dr. Lucky CowboyKnox as soon as possible to schedule an appointment. Eating very hot and cold foods may worsen your main until the tooth is repaired. If you develop a fever, chills, difficulty swallowing or swelling around the tooth, you can return to the Emergency Department for re-evaluation.

## 2016-07-02 ENCOUNTER — Emergency Department (HOSPITAL_COMMUNITY): Payer: BLUE CROSS/BLUE SHIELD

## 2016-07-02 ENCOUNTER — Encounter (HOSPITAL_COMMUNITY): Payer: Self-pay | Admitting: Emergency Medicine

## 2016-07-02 ENCOUNTER — Emergency Department (HOSPITAL_COMMUNITY)
Admission: EM | Admit: 2016-07-02 | Discharge: 2016-07-02 | Disposition: A | Discharge status: Home / Self Care | Payer: BLUE CROSS/BLUE SHIELD | Attending: Emergency Medicine | Admitting: Emergency Medicine

## 2016-07-02 DIAGNOSIS — S61210A Laceration without foreign body of right index finger without damage to nail, initial encounter: Secondary | ICD-10-CM | POA: Insufficient documentation

## 2016-07-02 DIAGNOSIS — Y999 Unspecified external cause status: Secondary | ICD-10-CM | POA: Insufficient documentation

## 2016-07-02 DIAGNOSIS — S60021A Contusion of right index finger without damage to nail, initial encounter: Secondary | ICD-10-CM | POA: Insufficient documentation

## 2016-07-02 DIAGNOSIS — W231XXA Caught, crushed, jammed, or pinched between stationary objects, initial encounter: Secondary | ICD-10-CM | POA: Insufficient documentation

## 2016-07-02 DIAGNOSIS — Z91040 Latex allergy status: Secondary | ICD-10-CM | POA: Insufficient documentation

## 2016-07-02 DIAGNOSIS — Z79899 Other long term (current) drug therapy: Secondary | ICD-10-CM | POA: Insufficient documentation

## 2016-07-02 DIAGNOSIS — Y929 Unspecified place or not applicable: Secondary | ICD-10-CM | POA: Insufficient documentation

## 2016-07-02 DIAGNOSIS — Y939 Activity, unspecified: Secondary | ICD-10-CM | POA: Insufficient documentation

## 2016-07-02 DIAGNOSIS — J45909 Unspecified asthma, uncomplicated: Secondary | ICD-10-CM | POA: Insufficient documentation

## 2016-07-02 MED ORDER — LIDOCAINE HCL (PF) 1 % IJ SOLN
5.0000 mL | Freq: Once | INTRAMUSCULAR | Status: AC
  Administered 2016-07-02: 5 mL
  Filled 2016-07-02: qty 5

## 2016-07-02 MED ORDER — BACITRACIN ZINC 500 UNIT/GM EX OINT: TOPICAL_OINTMENT | Freq: Two times a day (BID) | CUTANEOUS | Status: DC

## 2016-07-02 MED ORDER — TETANUS-DIPHTH-ACELL PERTUSSIS 5-2.5-18.5 LF-MCG/0.5 IM SUSP
0.5000 mL | Freq: Once | INTRAMUSCULAR | Status: AC
  Administered 2016-07-02: 0.5 mL via INTRAMUSCULAR
  Filled 2016-07-02: qty 0.5

## 2016-07-02 NOTE — ED Triage Notes (Signed)
Closed right index finger in car door-- small laceration-- hurts to bend

## 2016-07-02 NOTE — ED Provider Notes (Signed)
[facility]PT Provider Note   CSN: [id number] Arrival date & time: 07/02/16  1830  By signing my name below, I, Melissa [name], attest that this documentation has been prepared under the direction and in the presence of Melissa BuffaloHope Octavis Sheeler, NP.  Electronically Signed: Vista Minkobert [name], ED Scribe. 07/02/16. 7:41 PM.  History   Chief Complaint Chief Complaint  Patient presents with  . finger lac    HPI HPI Comments: Melissa [name] is a 36 y.o. female, with no significant PMHx, who presents to the Emergency Department s/p an injury that occurred just prior to arrival. Pt was getting out of a car and accidentally dragged some of the seatbelt with her. She went to close the door and tried to return the seatbelt before the door closed. She caught her right index finger in the car door. Pt has a small laceration to the right index finger. She is able to move the finger but with increased difficulty due to pain. Bleeding is currently controlled with bandage. No numbness or tingling in the extremity.   The history is provided by the patient. No language interpreter was used.    Past Medical History:  Diagnosis Date  . Asthma   . IBS (irritable bowel syndrome)   . Migraine     Patient Active Problem List   Diagnosis Date Noted  . RHINITIS, ALLERGIC 03/30/2006  . ASTHMA, UNSPECIFIED 03/30/2006  . IRRITABLE BOWEL SYNDROME 03/30/2006  . PYELONEPHRITIS, ACUTE 03/30/2006  . MENSTRUATION, PAINFUL 03/30/2006  . METRORRHAGIA 03/30/2006    History reviewed. No pertinent surgical history.  OB History    No data available       Home Medications    Prior to Admission medications   Medication Sig Start Date End Date Taking? Authorizing Provider  albuterol (PROVENTIL) (2.5 MG/3ML) 0.083% nebulizer solution Take 2.5 mg by nebulization every 6 (six) hours as needed for wheezing or shortness of breath (wheezing and sob).    [provider]  dextromethorphan-guaiFENesin (MUCINEX DM) 30-600 MG per  12 hr tablet Take 1 tablet by mouth every 4 (four) hours as needed for cough (cough).    [provider]  escitalopram (LEXAPRO) 10 MG tablet Take 10 mg by mouth daily.    [provider]  HYDROcodone-homatropine (HYCODAN) 5-1.5 MG/5ML syrup Take 5 mLs by mouth every 6 (six) hours as needed for cough. 06/30/14   Melissa [name], Melanie, MD  ibuprofen (ADVIL,MOTRIN) 800 MG tablet Take 1 tablet (800 mg total) by mouth 3 (three) times daily. 06/25/16   [name], Melissa A, PA-C  pantoprazole (PROTONIX) 40 MG tablet Take 40 mg by mouth daily.    [provider]  predniSONE (DELTASONE) 10 MG tablet Take 2 tablets (20 mg total) by mouth daily. 06/30/14   Melissa [name], Melanie, MD    Family History No family history on file.  Social History Social History  Substance Use Topics  . Smoking status: Never Smoker  . Smokeless tobacco: Never Used  . Alcohol use Yes     Allergies   Asa [aspirin] and Latex   Review of Systems Review of Systems  Gastrointestinal: Negative for nausea and vomiting.  Musculoskeletal: Positive for arthralgias.  Skin: Positive for wound (right index finger).  Neurological: Negative for numbness.     Physical Exam Updated Vital Signs BP 131/89 (BP Location: Left Arm)   Pulse 89   Temp 98.6 F (37 C) (Oral)   Resp 16   Ht 5\' 6"  (1.676 m)   Wt 76.2 kg (168 lb)  LMP 06/14/2016   SpO2 100%   BMI 27.12 kg/m   Physical Exam  Constitutional: She is oriented to person, place, and time. She appears well-developed and well-nourished. No distress.  HENT:  Head: Normocephalic and atraumatic.  Eyes: EOM are normal.  Neck: Neck supple.  Cardiovascular: Normal rate.   Pulmonary/Chest: Effort normal.  Musculoskeletal: Normal range of motion.  Neurological: She is alert and oriented to person, place, and time. No cranial nerve deficit.  Skin: Skin is warm and dry.  2cm laceration to right index finger   Psychiatric: She has a normal mood and affect.    Nursing note and vitals reviewed.    ED Treatments / Results  DIAGNOSTIC STUDIES: Oxygen Saturation is 100% on RA, normal by my interpretation.  COORDINATION OF CARE: 7:43 PM-Will irrigate wound and reassess. Discussed treatment plan with pt at bedside and pt agreed to plan.  8:31 PM- Wound was irrigated successfully. Majority superficial. Pt already taking abx for a dental problem so she will continue using.    Radiology Dg Finger Index Right  Result Date: 07/02/2016 CLINICAL DATA:  Slammed index finger in car door today. Index finger pain and unable to bend. Initial encounter. EXAM: RIGHT INDEX FINGER 2+V COMPARISON:  None. FINDINGS: There is no evidence of fracture or dislocation. There is no evidence of arthropathy or other focal bone abnormality. Mild soft tissue swelling seen along the dorsal aspect of the middle phalanx. No evidence of radiopaque foreign body. IMPRESSION: Mild dorsal soft tissue swelling. No evidence of fracture or radiopaque foreign body. Electronically Signed   By: Myles Rosenthal M.D.   On: 07/02/2016 19:33    Procedures  Wound cleaned with NSS with peroxide, anesthestized with lidocaine 1% without epi. Irrigated with NSS, wound left open, bacitracin ointment and dressing applied. Splint for comfort.  Patient will continue antibiotics she is currently taking for dental procedure. Return precautions discussed.  Procedures (including critical care time)  Medications Ordered in ED Medications  bacitracin ointment (not administered)  lidocaine (PF) (XYLOCAINE) 1 % injection 5 mL (5 mLs Infiltration Given 07/02/16 2004)  Tdap (BOOSTRIX) injection 0.5 mL (0.5 mLs Intramuscular Given 07/02/16 2004)     Initial Impression / Assessment and Plan / ED Course  I have reviewed the triage vital signs and the nursing notes.  Pertinent imaging results that were available during my care of the patient were reviewed by me and considered in my medical decision making (see chart  for details).   Final Clinical Impressions(s) / ED Diagnoses  Tdap booster given.Pressure irrigation performed. Laceration occurred < 8 hours prior. Pt has no co morbidities to effect normal wound healing. Discussed home care w pt and answered questions. She will continue taking her abx for dental procedure. Pt is hemodynamically stable with no focal neuro deficits no complaints prior to dc. Pt given return precautions and pt voiced understanding.    Final diagnoses:  Laceration of right index finger without foreign body without damage to nail, initial encounter  Contusion of right index finger without damage to nail, initial encounter    New Prescriptions New Prescriptions   No medications on file  I personally performed the services described in this documentation, which was scribed in my presence. The recorded information has been reviewed and is accurate.    Melissa Buffalo [place], [place] 07/02/16 2105    Maia Plan, MD 07/03/16 [phone]

## 2016-07-02 NOTE — Discharge Instructions (Signed)
Wear the splint for comfort. Continue the antibiotics you are currently taking. Return for any signs of infection.

## 2016-07-02 NOTE — ED Notes (Signed)
Pt's finger soaking in saline and peroxide

## 2016-08-15 ENCOUNTER — Emergency Department (HOSPITAL_COMMUNITY)
Admission: EM | Admit: 2016-08-15 | Discharge: 2016-08-15 | Disposition: A | Discharge status: Home / Self Care | Payer: Self-pay | Attending: Emergency Medicine | Admitting: Emergency Medicine

## 2016-08-15 ENCOUNTER — Emergency Department (HOSPITAL_COMMUNITY): Payer: Self-pay

## 2016-08-15 ENCOUNTER — Encounter (HOSPITAL_COMMUNITY): Payer: Self-pay | Admitting: Emergency Medicine

## 2016-08-15 DIAGNOSIS — J45909 Unspecified asthma, uncomplicated: Secondary | ICD-10-CM | POA: Insufficient documentation

## 2016-08-15 DIAGNOSIS — J189 Pneumonia, unspecified organism: Secondary | ICD-10-CM

## 2016-08-15 DIAGNOSIS — Z791 Long term (current) use of non-steroidal anti-inflammatories (NSAID): Secondary | ICD-10-CM | POA: Insufficient documentation

## 2016-08-15 DIAGNOSIS — Z91040 Latex allergy status: Secondary | ICD-10-CM | POA: Insufficient documentation

## 2016-08-15 DIAGNOSIS — J181 Lobar pneumonia, unspecified organism: Secondary | ICD-10-CM | POA: Insufficient documentation

## 2016-08-15 DIAGNOSIS — Z79899 Other long term (current) drug therapy: Secondary | ICD-10-CM | POA: Insufficient documentation

## 2016-08-15 DIAGNOSIS — R05 Cough: Secondary | ICD-10-CM | POA: Insufficient documentation

## 2016-08-15 LAB — CBC
HCT: 38.2 % (ref 36.0–46.0)
Hemoglobin: 12.8 g/dL (ref 12.0–15.0)
MCH: 29 pg (ref 26.0–34.0)
MCHC: 33.5 g/dL (ref 30.0–36.0)
MCV: 86.4 fL (ref 78.0–100.0)
PLATELETS: 228 10*3/uL (ref 150–400)
RBC: 4.42 MIL/uL (ref 3.87–5.11)
RDW: 12.7 % (ref 11.5–15.5)
WBC: 8.3 10*3/uL (ref 4.0–10.5)

## 2016-08-15 LAB — BASIC METABOLIC PANEL
Anion gap: 10 (ref 5–15)
BUN: 5 mg/dL — AB (ref 6–20)
CALCIUM: 9.4 mg/dL (ref 8.9–10.3)
CO2: 25 mmol/L (ref 22–32)
CREATININE: 0.87 mg/dL (ref 0.44–1.00)
Chloride: 98 mmol/L — ABNORMAL LOW (ref 101–111)
Glucose, Bld: 123 mg/dL — ABNORMAL HIGH (ref 65–99)
Potassium: 3.3 mmol/L — ABNORMAL LOW (ref 3.5–5.1)
SODIUM: 133 mmol/L — AB (ref 135–145)

## 2016-08-15 LAB — D-DIMER, QUANTITATIVE (NOT AT ARMC): D DIMER QUANT: 0.36 ug{FEU}/mL (ref 0.00–0.50)

## 2016-08-15 MED ORDER — AZITHROMYCIN 250 MG PO TABS: 250.0000 mg | ORAL_TABLET | Freq: Every day | ORAL | 0 refills | Status: DC

## 2016-08-15 MED ORDER — ALBUTEROL SULFATE (2.5 MG/3ML) 0.083% IN NEBU
INHALATION_SOLUTION | RESPIRATORY_TRACT | Status: AC
  Filled 2016-08-15: qty 6

## 2016-08-15 MED ORDER — AZITHROMYCIN 250 MG PO TABS
500.0000 mg | ORAL_TABLET | Freq: Once | ORAL | Status: AC
  Administered 2016-08-15: 500 mg via ORAL
  Filled 2016-08-15: qty 2

## 2016-08-15 MED ORDER — BENZONATATE 100 MG PO CAPS: 100.0000 mg | ORAL_CAPSULE | Freq: Three times a day (TID) | ORAL | 0 refills | Status: DC

## 2016-08-15 MED ORDER — BENZONATATE 100 MG PO CAPS
200.0000 mg | ORAL_CAPSULE | Freq: Once | ORAL | Status: AC
  Administered 2016-08-15: 200 mg via ORAL
  Filled 2016-08-15: qty 2

## 2016-08-15 MED ORDER — POTASSIUM CHLORIDE CRYS ER 20 MEQ PO TBCR
40.0000 meq | EXTENDED_RELEASE_TABLET | Freq: Once | ORAL | Status: AC
  Administered 2016-08-15: 40 meq via ORAL
  Filled 2016-08-15: qty 2

## 2016-08-15 MED ORDER — ALBUTEROL SULFATE (2.5 MG/3ML) 0.083% IN NEBU
5.0000 mg | INHALATION_SOLUTION | Freq: Once | RESPIRATORY_TRACT | Status: AC
  Administered 2016-08-15: 5 mg via RESPIRATORY_TRACT

## 2016-08-15 NOTE — Discharge Instructions (Signed)
Use your albuterol every 4 hours as needed for shortness of breath. Return if needed more than every 4 hours. Take the cough medicine as needed. Make sure that you finish the antibiotic. Call any of the numbers listed to get a primary care physician if not feeling better in a week. It is okay to take Tylenol as directed for pain or fever. Return if concern for any reason

## 2016-08-15 NOTE — ED Provider Notes (Signed)
[facility]-EMERGENCY DEPT Provider Note   CSN: [id number] Arrival date & time: 08/15/16  1521  By signing my name below, I, Melissa [name], attest that this documentation has been prepared under the direction and in the presence of Doug Sou, MD. Electronically Signed: Karren Cobble, ED Scribe. 08/15/16. 6:26 PM.  History   Chief Complaint Chief Complaint  Patient presents with  . Shortness of Breath  . Cough   The history is provided by the patient. No language interpreter was used.   HPI Comments: Melissa [name] is a 36 y.o. female with a history of asthma, who presents to the Emergency Department complaining of sudden onset, gradually worsening cough that began two days ago. She notes associated hemoptysis, shortness of breath, chest pain secondary to her cough, subjective fever and chills. Pt reports two days ago when she began coughing she was coughing up blood. At this time she reports she has no production with her cough. She denies green or yellow phlegm production. Has a hx of Pneumonia and states she had to have "fluid removed from her lungs over 10 years ago, due to being given too much saline". She is currently on Lexapro and Albuterol. No tobacco or illicit drug usage.Occasional alcohol usage. Denies emesis. No other acute associated symptoms noted at this time. No treatment prior to coming here. She's been treated with albuterol improvement of breathing. Past Medical History:  Diagnosis Date  . Asthma   . IBS (irritable bowel syndrome)   . Migraine    Patient Active Problem List   Diagnosis Date Noted  . RHINITIS, ALLERGIC 03/30/2006  . ASTHMA, UNSPECIFIED 03/30/2006  . IRRITABLE BOWEL SYNDROME 03/30/2006  . PYELONEPHRITIS, ACUTE 03/30/2006  . MENSTRUATION, PAINFUL 03/30/2006  . METRORRHAGIA 03/30/2006   History reviewed. No pertinent surgical history.  OB History    No data available     Home Medications    Prior to Admission medications   Medication Sig Start Date  End Date Taking? Authorizing Provider  albuterol (PROVENTIL) (2.5 MG/3ML) 0.083% nebulizer solution Take 2.5 mg by nebulization every 6 (six) hours as needed for wheezing or shortness of breath (wheezing and sob).    [provider]  dextromethorphan-guaiFENesin (MUCINEX DM) 30-600 MG per 12 hr tablet Take 1 tablet by mouth every 4 (four) hours as needed for cough (cough).    [provider]  escitalopram (LEXAPRO) 10 MG tablet Take 10 mg by mouth daily.    [provider]  HYDROcodone-homatropine (HYCODAN) 5-1.5 MG/5ML syrup Take 5 mLs by mouth every 6 (six) hours as needed for cough. 06/30/14   Rolan Bucco, MD  ibuprofen (ADVIL,MOTRIN) 800 MG tablet Take 1 tablet (800 mg total) by mouth 3 (three) times daily. 06/25/16   McDonald, Mia A, PA-C  pantoprazole (PROTONIX) 40 MG tablet Take 40 mg by mouth daily.    [provider]  predniSONE (DELTASONE) 10 MG tablet Take 2 tablets (20 mg total) by mouth daily. 06/30/14   Rolan Bucco, MD   Family History No family history on file.  Social History Social History  Substance Use Topics  . Smoking status: Never Smoker  . Smokeless tobacco: Never Used  . Alcohol use Yes   Allergies   Asa [aspirin] and Latex  Review of Systems Review of Systems  Constitutional: Positive for chills and fever.       Subjective fever  HENT: Negative.   Respiratory: Positive for cough and shortness of breath.   Cardiovascular: Positive for chest pain.  Chest pain with cough  Gastrointestinal: Negative.   Musculoskeletal: Negative.   Skin: Negative.   Neurological: Negative.   Psychiatric/Behavioral: Negative.   All other systems reviewed and are negative.    Physical Exam Updated Vital Signs BP (!) 125/95   Pulse 91   Temp 99.1 F (37.3 C) (Oral)   Resp 20   Ht 5\' 7"  (1.702 m)   Wt 175 lb 6.4 oz (79.6 kg)   LMP 07/22/2016   SpO2 98%   BMI 27.47 kg/m   Physical Exam  Constitutional: She appears  well-developed and well-nourished.  HENT:  Head: Normocephalic and atraumatic.  Eyes: Pupils are equal, round, and reactive to light. Conjunctivae are normal.  Neck: Neck supple. No tracheal deviation present. No thyromegaly present.  Cardiovascular: Normal rate and regular rhythm.   No murmur heard. Pulmonary/Chest: Effort normal and breath sounds normal.  Coughing frequently  Abdominal: Soft. Bowel sounds are normal. She exhibits no distension. There is no tenderness.  Musculoskeletal: Normal range of motion. She exhibits no edema or tenderness.  Neurological: She is alert. Coordination normal.  Skin: Skin is warm and dry. No rash noted.  Psychiatric: She has a normal mood and affect.  Nursing note and vitals reviewed.    ED Treatments / Results  DIAGNOSTIC STUDIES: Oxygen Saturation is 98% on RA, normal by my interpretation.   COORDINATION OF CARE: 6:18 PM-Discussed next steps with pt. Pt verbalized understanding and is agreeable with the plan.   Labs (all labs ordered are listed, but only abnormal results are displayed) Labs Reviewed  BASIC METABOLIC PANEL - Abnormal; Notable for the following:       Result Value   Sodium 133 (*)    Potassium 3.3 (*)    Chloride 98 (*)    Glucose, Bld 123 (*)    BUN 5 (*)    All other components within normal limits  CBC  Chest x-ray viewed by me   EKG  EKG Interpretation None      Results for orders placed or performed during the hospital encounter of 08/15/16  Basic metabolic panel  Result Value Ref Range   Sodium 133 (L) 135 - 145 mmol/L   Potassium 3.3 (L) 3.5 - 5.1 mmol/L   Chloride 98 (L) 101 - 111 mmol/L   CO2 25 22 - 32 mmol/L   Glucose, Bld 123 (H) 65 - 99 mg/dL   BUN 5 (L) 6 - 20 mg/dL   Creatinine, Ser 1.61 0.44 - 1.00 mg/dL   Calcium 9.4 8.9 - 09.6 mg/dL   GFR calc non Af Amer >60 >60 mL/min   GFR calc Af Amer >60 >60 mL/min   Anion gap 10 5 - 15  CBC  Result Value Ref Range   WBC 8.3 4.0 - 10.5 K/uL    RBC 4.42 3.87 - 5.11 MIL/uL   Hemoglobin 12.8 12.0 - 15.0 g/dL   HCT 04.5 40.9 - 81.1 %   MCV 86.4 78.0 - 100.0 fL   MCH 29.0 26.0 - 34.0 pg   MCHC 33.5 30.0 - 36.0 g/dL   RDW 91.4 78.2 - 95.6 %   Platelets 228 150 - 400 K/uL  D-dimer, quantitative (not at [facility])  Result Value Ref Range   D-Dimer, Quant 0.36 0.00 - 0.50 ug/mL-FEU   Dg Chest 2 View  Result Date: 08/15/2016 CLINICAL DATA:  36 y/o  F; cough, fever, rib pain. EXAM: CHEST  2 VIEW COMPARISON:  06/30/2014 chest radiograph FINDINGS: Stable normal cardiac silhouette.  Ill-defined right middle lobe opacity may represent pneumonia. No pleural effusion or pneumothorax. Bones are unremarkable. IMPRESSION: Ill-defined opacity in right middle lobe may represent developing pneumonia. Electronically Signed   By: Mitzi HansenLance  Furusawa-Stratton M.D.   On: 08/15/2016 17:21   Radiology Dg Chest 2 View  Result Date: 08/15/2016 CLINICAL DATA:  36 y/o  F; cough, fever, rib pain. EXAM: CHEST  2 VIEW COMPARISON:  06/30/2014 chest radiograph FINDINGS: Stable normal cardiac silhouette. Ill-defined right middle lobe opacity may represent pneumonia. No pleural effusion or pneumothorax. Bones are unremarkable. IMPRESSION: Ill-defined opacity in right middle lobe may represent developing pneumonia. Electronically Signed   By: Mitzi HansenLance  Furusawa-Stratton M.D.   On: 08/15/2016 17:21    Procedures Procedures (including critical care time)  Medications Ordered in ED Medications  albuterol (PROVENTIL) (2.5 MG/3ML) 0.083% nebulizer solution (  Not Given 08/15/16 1559)  albuterol (PROVENTIL) (2.5 MG/3ML) 0.083% nebulizer solution 5 mg (5 mg Nebulization Given 08/15/16 1559)  7:20 PM Coughing is improved after treatment with Tessalon Perle. First dose of azithromycin to be administered here She also received oral potassium supplementation while here. She reports she last had pneumonia 2 years ago. Plan prescription azithromycin, Tessalon Perles. She has albuterol at home.  Referral primary care. Initial Impression / Assessment and Plan / ED Course  I have reviewed the triage vital signs and the nursing notes.  Pertinent labs & imaging results that were available during my care of the patient were reviewed by me and considered in my medical decision making (see chart for details).     Low pretest clinical probability for pulmonary embolism. Negative d-dimer With subjective fever, chills, cough clinically patient has pneumonia Final Clinical Impressions(s) / ED Diagnoses  Diagnosis #1 community acquired pneumonia #2 hypokalemia Final diagnoses:  None   New Prescriptions New Prescriptions   No medications on file  I personally performed the services described in this documentation, which was scribed in my presence. The recorded information has been reviewed and considered.     Doug SouJacubowitz, Lavonn Maxcy, MD 08/15/16 1929

## 2016-08-15 NOTE — ED Triage Notes (Signed)
Pt c/o cough for 2 days-- fever, coughing up blood tinged sputum, rib pain from coughing. Mother being treated for bronchitis with antibiotics for same.

## 2016-09-16 ENCOUNTER — Encounter: Payer: Self-pay | Admitting: Family Medicine

## 2016-09-16 ENCOUNTER — Ambulatory Visit (INDEPENDENT_AMBULATORY_CARE_PROVIDER_SITE_OTHER): Payer: Self-pay | Admitting: Family Medicine

## 2016-09-16 ENCOUNTER — Ambulatory Visit (INDEPENDENT_AMBULATORY_CARE_PROVIDER_SITE_OTHER): Payer: Self-pay

## 2016-09-16 VITALS — Ht 67.0 in | Wt 174.6 lb

## 2016-09-16 DIAGNOSIS — R0602 Shortness of breath: Secondary | ICD-10-CM

## 2016-09-16 DIAGNOSIS — R091 Pleurisy: Secondary | ICD-10-CM

## 2016-09-16 DIAGNOSIS — Z01419 Encounter for gynecological examination (general) (routine) without abnormal findings: Secondary | ICD-10-CM

## 2016-09-16 DIAGNOSIS — B372 Candidiasis of skin and nail: Secondary | ICD-10-CM

## 2016-09-16 LAB — CBC WITH DIFFERENTIAL/PLATELET
BASOS ABS: 0.1 10*3/uL (ref 0.0–0.1)
Basophils Relative: 1.1 % (ref 0.0–3.0)
EOS PCT: 0.8 % (ref 0.0–5.0)
Eosinophils Absolute: 0 10*3/uL (ref 0.0–0.7)
HCT: 37.9 % (ref 36.0–46.0)
HEMOGLOBIN: 12.5 g/dL (ref 12.0–15.0)
LYMPHS ABS: 2.6 10*3/uL (ref 0.7–4.0)
Lymphocytes Relative: 51.6 % — ABNORMAL HIGH (ref 12.0–46.0)
MCHC: 32.9 g/dL (ref 30.0–36.0)
MCV: 88.9 fl (ref 78.0–100.0)
MONO ABS: 0.4 10*3/uL (ref 0.1–1.0)
MONOS PCT: 7.3 % (ref 3.0–12.0)
NEUTROS PCT: 39.2 % — AB (ref 43.0–77.0)
Neutro Abs: 2 10*3/uL (ref 1.4–7.7)
Platelets: 212 10*3/uL (ref 150.0–400.0)
RBC: 4.26 Mil/uL (ref 3.87–5.11)
RDW: 12.8 % (ref 11.5–15.5)
WBC: 5.1 10*3/uL (ref 4.0–10.5)

## 2016-09-16 LAB — COMPREHENSIVE METABOLIC PANEL WITH GFR
ALT: 22 U/L (ref 0–35)
AST: 19 U/L (ref 0–37)
Albumin: 4.2 g/dL (ref 3.5–5.2)
Alkaline Phosphatase: 35 U/L — ABNORMAL LOW (ref 39–117)
BUN: 7 mg/dL (ref 6–23)
CO2: 25 meq/L (ref 19–32)
Calcium: 9.3 mg/dL (ref 8.4–10.5)
Chloride: 105 meq/L (ref 96–112)
Creatinine, Ser: 0.75 mg/dL (ref 0.40–1.20)
GFR: 111.42 mL/min (ref >60.00)
Glucose, Bld: 89 mg/dL (ref 70–99)
Potassium: 3.9 meq/L (ref 3.5–5.1)
Sodium: 136 meq/L (ref 135–145)
Total Bilirubin: 0.5 mg/dL (ref 0.2–1.2)
Total Protein: 7.2 g/dL (ref 6.0–8.3)

## 2016-09-16 LAB — TSH: TSH: 0.48 u[IU]/mL (ref 0.35–4.50)

## 2016-09-16 MED ORDER — NAPROXEN 500 MG PO TABS: 500.0000 mg | ORAL_TABLET | Freq: Two times a day (BID) | ORAL | 0 refills | Status: DC

## 2016-09-16 MED ORDER — NYSTATIN 100000 UNIT/GM EX POWD: Freq: Four times a day (QID) | CUTANEOUS | 0 refills | Status: DC

## 2016-09-16 NOTE — Patient Instructions (Signed)
You have inflammation in the tissue surrounding your lungs.  We will start an antiinflammatory.  Take for 2 weeks.  Try the nystatin powder.  Please come back in 2-3 weeks for a recheck.  Take care,  Dr [name]

## 2016-09-16 NOTE — Progress Notes (Addendum)
Subjective:  Melissa [name] is a 36 y.o. female who presents today to establish care and also for shortness of breath.   HPI:  Pneumonia/Shortness of Breath Patient presented to the ED 4 weeks ago with shortness of breath. She had an xray performed there which showed pneumonia. She was discharged home with a course of prednisone and azithromycin. She intially got better after a week or so, though over the past couple of weeks has noticed that her symptoms have significantly worsened. She is now also complaining in pain on her side and back with deep breaths. Some chills. No fevers. She feels much weaker now than she did a few weeks ago. She has a history of asthma for which she uses albuterol almost daily. Pain is worse with deep inspiration. She reporst having a history of "having fluid taken off" her lungs approximately 10 years ago.  She has difficulty laying flat on her back due to feeling short of breath. She occasionally has swelling in her lower extremities.   Rash Patient also concerned about rash under her breasts bilaterally. Only noticed a week or two prior. Does not itch or burn. No discharge.   Depression screen [facility] 2/9 09/16/2016  Decreased Interest 0  Down, Depressed, Hopeless 0  PHQ - 2 Score 0  Altered sleeping 0  Tired, decreased energy 3  Change in appetite 0  Feeling bad or failure about yourself  0  Trouble concentrating 0  Moving slowly or fidgety/restless 0  PHQ-9 Score 3    Pertinent Gynecological History: Patient's last menstrual period was 08/31/2016. Sexually active: No Menses: Regular Previous GYN Procedures: DNC  Last pap: Normal last year.  [id number]  Health Maintenance Due  Topic Date Due  . HIV Screening  01/17/1994  . PAP SMEAR  04/01/2003  . INFLUENZA VACCINE  08/31/2016    ROS: Per HPI, otherwise all systems reviewed and are negative  PMH:  The following were reviewed and entered/updated in epic: Past Medical History:  Diagnosis Date  .  Asthma   . IBS (irritable bowel syndrome)   . Migraine    Patient Active Problem List   Diagnosis Date Noted  . RHINITIS, ALLERGIC 03/30/2006  . ASTHMA, UNSPECIFIED 03/30/2006  . IRRITABLE BOWEL SYNDROME 03/30/2006  . PYELONEPHRITIS, ACUTE 03/30/2006  . MENSTRUATION, PAINFUL 03/30/2006  . METRORRHAGIA 03/30/2006   Past Surgical History:  Procedure Laterality Date  . [facility]  2008    Family History  Problem Relation Age of Onset  . Arthritis Mother   . Diabetes Mother   . High Cholesterol Mother   . Cancer Father   . High blood pressure Father   . High Cholesterol Sister   . High blood pressure Sister   . Kidney disease Maternal Grandmother   . Cancer Maternal Grandfather   . Heart attack Maternal Grandfather   . High Cholesterol Paternal Grandmother   . High blood pressure Paternal Grandmother   . High blood pressure Paternal Grandfather     Medications- reviewed and updated Current Outpatient Prescriptions  Medication Sig Dispense Refill  . Ascorbic Acid (VITAMIN C) 1000 MG tablet Take 1,000 mg by mouth daily.    . cholecalciferol (VITAMIN D) 1000 units tablet Take 1,000 Units by mouth daily.    . COD LIVER OIL PO Take by mouth.    . vitamin E 600 UNIT capsule Take 1,200 Units by mouth daily.    Marland Kitchen albuterol (PROVENTIL) (2.5 MG/3ML) 0.083% nebulizer solution Take 2.5 mg by nebulization every 6 (  six) hours as needed for wheezing or shortness of breath (wheezing and sob).    Marland Kitchen escitalopram (LEXAPRO) 10 MG tablet Take 10 mg by mouth daily.    . naproxen (NAPROSYN) 500 MG tablet Take 1 tablet (500 mg total) by mouth 2 (two) times daily with a meal. 30 tablet 0  . nystatin (MYCOSTATIN/NYSTOP) powder Apply topically 4 (four) times daily. 15 g 0   No current facility-administered medications for this visit.    Allergies-reviewed and updated Allergies  Allergen Reactions  . Asa [Aspirin]   . Latex    Social History   Social History  . Marital status: Single    Spouse  name: N/A  . Number of children: 0  . Years of education: N/A   Social History Main Topics  . Smoking status: Never Smoker  . Smokeless tobacco: Never Used  . Alcohol use Yes  . Drug use: No  . Sexual activity: Not Asked   Other Topics Concern  . None   Social History Narrative  . None   Objective:  Physical Exam: Ht 5\' 7"  (1.702 m)   Wt 174 lb 9.6 oz (79.2 kg)   LMP 08/31/2016   BMI 27.35 kg/m   Body mass index is 27.35 kg/m. Gen: NAD, resting comfortably CV: RRR with no murmurs appreciated Pulm: NWOB, CTAB with no crackles, wheezes, or rhonchi. Pain with deep inspiration.  GI: Normal bowel sounds present. Soft, Nontender, Nondistended. MSK: no edema, cyanosis, or clubbing noted Skin: slightly erythematous macerated rash under each breast bilaterally Neuro: grossly normal, moves all extremities Psych: Affect very blunted. Normal thought content.   Imaging: (I independently viewed the imaging detailed below) CXR: No effusions or obvious infiltrates. Heart with normal size and contour.   Assessment/Plan:  Pleurisy CXR clear and lung exam normal. No signs of recurrent pneumonia, PTX, or pleural effusion. No signs of pericarditis. PE ruled out with PERC. Symptoms likely pleuritic chest pain related to her recent pneumonia. Will start naproxen 500mg  bid for the next two weeks. Strict return precautions reviewed including worsening pain, shortness of breath, or chest pain. Follow up in 2-3 weeks.   Intertrigo Rash consistent with superficial candidal infection. Rx given for nystatin powder.   Preventative Healthcare Patient deferred HIV testing today. Is up to date on pap testing - goes to gynecologist yearly.   Patient Counseling:  -Nutrition: Stressed importance of moderation in sodium/caffeine intake, saturated fat and cholesterol, caloric balance, sufficient intake of fresh fruits, vegetables, and fiber.  -Stressed the importance of regular exercise.   -Substance  Abuse: Discussed cessation/primary prevention of tobacco, alcohol, or other drug use; driving or other dangerous activities under the influence; availability of treatment for abuse.   -Injury prevention: Discussed safety belts, safety helmets, smoke detector, smoking near bedding or upholstery.   -Sexuality: Discussed sexually transmitted diseases, partner selection, use of condoms, avoidance of unintended pregnancy and contraceptive alternatives.   -Dental health: Discussed importance of regular tooth brushing, flossing, and dental visits.  -Health maintenance and immunizations reviewed. Please refer to Health maintenance section.  Katina Degree. [name], MD 09/16/2016 2:47 PM

## 2016-09-28 ENCOUNTER — Ambulatory Visit: Payer: Self-pay | Admitting: Allergy and Immunology

## 2017-05-24 ENCOUNTER — Other Ambulatory Visit (HOSPITAL_BASED_OUTPATIENT_CLINIC_OR_DEPARTMENT_OTHER): Payer: Self-pay

## 2017-05-24 DIAGNOSIS — R5383 Other fatigue: Secondary | ICD-10-CM

## 2017-05-24 DIAGNOSIS — R0683 Snoring: Secondary | ICD-10-CM

## 2017-06-21 ENCOUNTER — Ambulatory Visit (HOSPITAL_BASED_OUTPATIENT_CLINIC_OR_DEPARTMENT_OTHER): Payer: BLUE CROSS/BLUE SHIELD

## 2017-06-28 ENCOUNTER — Encounter (HOSPITAL_BASED_OUTPATIENT_CLINIC_OR_DEPARTMENT_OTHER): Payer: Self-pay

## 2017-07-28 ENCOUNTER — Encounter (HOSPITAL_BASED_OUTPATIENT_CLINIC_OR_DEPARTMENT_OTHER): Payer: BLUE CROSS/BLUE SHIELD

## 2017-08-08 ENCOUNTER — Encounter: Payer: Self-pay | Admitting: Sports Medicine

## 2017-08-08 ENCOUNTER — Ambulatory Visit: Payer: BLUE CROSS/BLUE SHIELD | Admitting: Sports Medicine

## 2017-08-08 VITALS — BP 129/95 | HR 99 | Temp 98.6°F | Resp 16 | Ht 67.0 in | Wt 170.0 lb

## 2017-08-08 DIAGNOSIS — B353 Tinea pedis: Secondary | ICD-10-CM

## 2017-08-08 MED ORDER — KETOCONAZOLE 2 % EX CREA: 1.0000 "application " | TOPICAL_CREAM | Freq: Two times a day (BID) | CUTANEOUS | 5 refills | Status: DC | PRN

## 2017-08-08 MED ORDER — CLOTRIMAZOLE 1 % EX SOLN: 1.0000 "application " | Freq: Two times a day (BID) | CUTANEOUS | 5 refills | Status: DC

## 2017-08-08 NOTE — Progress Notes (Signed)
   Subjective:    Patient ID: Melissa [name], female    DOB: 1980-08-30, 37 y.o.   MRN: [id number]  HPI    Review of Systems  All other systems reviewed and are negative.      Objective:   Physical Exam        Assessment & Plan:

## 2017-08-08 NOTE — Progress Notes (Signed)
Subjective: Melissa [name] is a 37 y.o. female patient who presents to office for evaluation of bilateral peeling skin. Patient complains of pain at the lesion present Right>Left foot at the bottoms and in between toes, states sometimes it burns. Patient has tried in past antifungals with no relief in symptoms. Patient denies any other pedal complaints.   Review of Systems  Skin: Positive for itching and rash.  Neurological: Positive for sensory change.  All other systems reviewed and are negative.    Patient Active Problem List   Diagnosis Date Noted  . RHINITIS, ALLERGIC 03/30/2006  . ASTHMA, UNSPECIFIED 03/30/2006  . IRRITABLE BOWEL SYNDROME 03/30/2006  . PYELONEPHRITIS, ACUTE 03/30/2006  . MENSTRUATION, PAINFUL 03/30/2006  . METRORRHAGIA 03/30/2006    Current Outpatient Medications on File Prior to Visit  Medication Sig Dispense Refill  . albuterol (PROVENTIL) (2.5 MG/3ML) 0.083% nebulizer solution Take 2.5 mg by nebulization every 6 (six) hours as needed for wheezing or shortness of breath (wheezing and sob).    . Ascorbic Acid (VITAMIN C) 1000 MG tablet Take 1,000 mg by mouth daily.    . cholecalciferol (VITAMIN D) 1000 units tablet Take 1,000 Units by mouth daily.    . COD LIVER OIL PO Take by mouth.    . escitalopram (LEXAPRO) 10 MG tablet Take 10 mg by mouth daily.    Marland Kitchen. gabapentin (NEURONTIN) 300 MG capsule Take 300 mg by mouth 3 (three) times daily.    Marland Kitchen. ibuprofen (ADVIL,MOTRIN) 800 MG tablet Take 800 mg by mouth every 8 (eight) hours as needed.    . naproxen (NAPROSYN) 500 MG tablet Take 1 tablet (500 mg total) by mouth 2 (two) times daily with a meal. 30 tablet 0  . nystatin (MYCOSTATIN/NYSTOP) powder Apply topically 4 (four) times daily. 15 g 0  . vitamin E 600 UNIT capsule Take 1,200 Units by mouth daily.     No current facility-administered medications on file prior to visit.     Allergies  Allergen Reactions  . Asa [Aspirin]   . Latex     Objective:  General:  Alert and oriented x3 in no acute distress  Dermatology: Keratotic scaly skin in a moccassion distrubution with skin lines transversing the lesions that extends to webspaces consistent with tinea, no webspace macerations, no ecchymosis bilateral, all nails x 10 are well manicured.  Vascular: Dorsalis Pedis and Posterior Tibial pedal pulses 2/4, Capillary Fill Time 3 seconds, + pedal hair growth bilateral, no edema bilateral lower extremities, Temperature gradient within normal limits.  Neurology: Michaell CowingGross sensation intact via light touch bilateral.  Musculoskeletal: No reproducible tenderness with palpation bilateral, Muscular strength 5/5 in all groups without pain or limitation on range of motion. No symptomatic lower extremity muscular or boney deformity noted.  Assessment and Plan: Problem List Items Addressed This Visit    None    Visit Diagnoses    Tinea pedis of both feet    -  Primary   Relevant Medications   ketoconazole (NIZORAL) 2 % cream   clotrimazole (LOTRIMIN) 1 % external solution   Other Relevant Orders   Hepatic Function Panel      -Complete examination performed -Discussed treatment options for tinea -Rx Ketoconazole and Lotrimin sol to use as instructed -Ordered LFTs to possibly start PO lamisil as well for significant tinea; If normal will send Lamisil to pharmacy for 6-8 weeks -Encouraged daily skin emollients -Encouraged use of pumice stone -Advised good supportive shoes and inserts -Patient to return to in 6 weeks or  sooner if condition worsens.  Landis Martins, DPM

## 2017-08-15 ENCOUNTER — Other Ambulatory Visit: Payer: Self-pay | Admitting: Sports Medicine

## 2017-08-15 LAB — HEPATIC FUNCTION PANEL
AG RATIO: 1.5 (calc) (ref 1.0–2.5)
ALBUMIN MSPROF: 4.6 g/dL (ref 3.6–5.1)
ALT: 30 U/L — ABNORMAL HIGH (ref 6–29)
AST: 22 U/L (ref 10–30)
Alkaline phosphatase (APISO): 46 U/L (ref 33–115)
Bilirubin, Direct: 0.1 mg/dL (ref 0.0–0.2)
Globulin: 3 g/dL (calc) (ref 1.9–3.7)
Indirect Bilirubin: 0.4 mg/dL (calc) (ref 0.2–1.2)
TOTAL PROTEIN: 7.6 g/dL (ref 6.1–8.1)
Total Bilirubin: 0.5 mg/dL (ref 0.2–1.2)

## 2017-08-16 ENCOUNTER — Telehealth: Payer: Self-pay | Admitting: *Deleted

## 2017-08-16 DIAGNOSIS — Z79899 Other long term (current) drug therapy: Secondary | ICD-10-CM

## 2017-08-16 NOTE — Telephone Encounter (Signed)
I informed pt of Dr. Wynema BirchStover's review of results and orders. Pt states understanding. Mailed copy of labs orders to pt to remind to repeat in 6 weeks.

## 2017-08-16 NOTE — Telephone Encounter (Signed)
-----   Message from Asencion Islamitorya Stover, [place] sent at 08/16/2017 10:44 AM EDT ----- ALT is slightly elevated. Will repeat blood work in 6 weeks to see if patient can start PO lamisil for tinea.  ALT can be elevated because of dehydration or other meds because its marginally elevated its not a major concern but must repeat blood test before I can start her on PO Lamisil Dr. SKathie Rhodes

## 2017-08-23 ENCOUNTER — Other Ambulatory Visit: Payer: Self-pay | Admitting: Family Medicine

## 2017-08-23 DIAGNOSIS — Z1231 Encounter for screening mammogram for malignant neoplasm of breast: Secondary | ICD-10-CM

## 2017-09-22 ENCOUNTER — Ambulatory Visit: Payer: BLUE CROSS/BLUE SHIELD | Admitting: Family Medicine

## 2017-09-22 ENCOUNTER — Encounter: Payer: Self-pay | Admitting: Family Medicine

## 2017-09-22 VITALS — BP 126/84 | HR 106 | Temp 98.6°F | Ht 67.0 in | Wt 175.6 lb

## 2017-09-22 DIAGNOSIS — M797 Fibromyalgia: Secondary | ICD-10-CM | POA: Insufficient documentation

## 2017-09-22 DIAGNOSIS — R928 Other abnormal and inconclusive findings on diagnostic imaging of breast: Secondary | ICD-10-CM | POA: Diagnosis not present

## 2017-09-22 DIAGNOSIS — F321 Major depressive disorder, single episode, moderate: Secondary | ICD-10-CM

## 2017-09-22 NOTE — Progress Notes (Signed)
   Subjective:  Melissa [name] is a 37 y.o. female who presents today with a chief complaint of fibromyalgia.   HPI:  Fibromyalgia, chronic problem, new to provider Symptoms started about a year ago.  She had comprehensive work-up done at that time per her report which was negative.  She was diagnosed with fibromyalgia.  She cannot take Celebrex due to her kidney function.  Currently takes ibuprofen 800 mg as needed for pain.  Has pain "all over her body".  She was also prescribed gabapentin one point but cannot tolerate due to side effects.  Her symptoms are uncontrolled but stable.  She would like to be referred to rheumatology for further evaluation.  Depression/Anxiety, established problem, Stable Currently on Lexapro 10 mg daily.  Tolerating well without reported side effects.  History of abnormal mammogram Patient had abnormal mammogram 2 years ago.  Would like to have repeat scan done.  ROS: Per HPI  PMH: She reports that she has never smoked. She has never used smokeless tobacco. She reports that she drinks alcohol. She reports that she does not use drugs.  Objective:  Physical Exam: BP 126/84 (BP Location: Left Arm, Patient Position: Sitting, Cuff Size: Normal)   Pulse (!) 106   Temp 98.6 F (37 C) (Oral)   Ht 5\' 7"  (1.702 m)   Wt 175 lb 9.6 oz (79.7 kg)   SpO2 97%   BMI 27.50 kg/m   Gen: NAD, resting comfortably CV: RRR with no murmurs appreciated Pulm: NWOB, CTAB with no crackles, wheezes, or rhonchi  Assessment/Plan:  Fibromyalgia Will place referral to rheumatology per patient request.  May be a good candidate for SNRI or TCA given concurrent depression.  Work note was given to patient requesting that she be allowed to sit frequently while at work.  Depression, major, single episode, moderate (HCC) Symptoms are stable.  Continue Lexapro 10 mg daily.  Abnormal mammogram Diagnostic mammogram ordered today.  Katina Degreealeb M. Jimmey RalphParker, MD 09/22/2017 1:18 PM

## 2017-09-22 NOTE — Assessment & Plan Note (Addendum)
Will place referral to rheumatology per patient request.  May be a good candidate for SNRI or TCA given concurrent depression.  Work note was given to patient requesting that she be allowed to sit frequently while at work.

## 2017-09-22 NOTE — Assessment & Plan Note (Signed)
Symptoms are stable.  Continue Lexapro 10 mg daily.

## 2017-09-22 NOTE — Patient Instructions (Signed)
It was very nice to see you today!  I will place your referral today and order your mammogram.  Take care, Dr Jimmey RalphParker

## 2017-09-22 NOTE — Assessment & Plan Note (Signed)
Diagnostic mammogram ordered today.

## 2017-10-06 ENCOUNTER — Other Ambulatory Visit: Payer: Self-pay | Admitting: Sports Medicine

## 2017-10-06 ENCOUNTER — Ambulatory Visit
Admission: RE | Admit: 2017-10-06 | Discharge: 2017-10-06 | Disposition: A | Discharge status: Home / Self Care | Payer: BLUE CROSS/BLUE SHIELD | Source: Ambulatory Visit | Attending: Family Medicine | Admitting: Family Medicine

## 2017-10-06 DIAGNOSIS — R928 Other abnormal and inconclusive findings on diagnostic imaging of breast: Secondary | ICD-10-CM

## 2017-10-07 LAB — HEPATIC FUNCTION PANEL
AG Ratio: 1.4 (calc) (ref 1.0–2.5)
ALKALINE PHOSPHATASE (APISO): 51 U/L (ref 33–115)
ALT: 27 U/L (ref 6–29)
AST: 22 U/L (ref 10–30)
Albumin: 4.4 g/dL (ref 3.6–5.1)
BILIRUBIN DIRECT: 0.1 mg/dL (ref 0.0–0.2)
BILIRUBIN INDIRECT: 0.3 mg/dL (ref 0.2–1.2)
Globulin: 3.2 g/dL (calc) (ref 1.9–3.7)
Total Bilirubin: 0.4 mg/dL (ref 0.2–1.2)
Total Protein: 7.6 g/dL (ref 6.1–8.1)

## 2017-10-09 ENCOUNTER — Telehealth: Payer: Self-pay | Admitting: Family Medicine

## 2017-10-09 NOTE — Telephone Encounter (Signed)
Copied from CRM [id number]. Topic: Quick Communication - See Telephone Encounter >> Oct 09, 2017  1:07 PM Luanna Cole wrote: CRM for notification. See Telephone encounter for: 10/09/17. Pt called and stated that she would like a referral to neurologist for migraines.  Pt would like to be referred to  Dr. Stanton Kidney neurology. Cb#[phone]. Please advise

## 2017-10-09 NOTE — Telephone Encounter (Signed)
See note

## 2017-10-09 NOTE — Telephone Encounter (Signed)
Please advise 

## 2017-10-10 ENCOUNTER — Telehealth: Payer: Self-pay | Admitting: *Deleted

## 2017-10-10 ENCOUNTER — Other Ambulatory Visit: Payer: Self-pay

## 2017-10-10 ENCOUNTER — Encounter: Payer: Self-pay | Admitting: Neurology

## 2017-10-10 DIAGNOSIS — G43809 Other migraine, not intractable, without status migrainosus: Secondary | ICD-10-CM

## 2017-10-10 MED ORDER — TERBINAFINE HCL 250 MG PO TABS: 250.0000 mg | ORAL_TABLET | Freq: Every day | ORAL | 0 refills | Status: DC

## 2017-10-10 NOTE — Telephone Encounter (Signed)
Left message for pt to call for results and order.

## 2017-10-10 NOTE — Telephone Encounter (Signed)
-----   Message from Asencion Islam, [place] sent at 10/09/2017  7:03 AM EDT ----- LFTs are normal. Send Lamisil 250mg  PO daily x 90 tabs to pharmacy. -Dr. Marylene Land

## 2017-10-10 NOTE — Telephone Encounter (Signed)
Pt called for results and I explained Dr. Wynema Birch review of results and orders.

## 2017-10-10 NOTE — Telephone Encounter (Signed)
Referral has been placed. 

## 2017-10-10 NOTE — Telephone Encounter (Signed)
Ok with me. Please place any necessary orders.  Katina Degree. [name], MD 10/10/2017 8:22 AM

## 2017-10-19 ENCOUNTER — Telehealth: Payer: Self-pay

## 2017-10-19 NOTE — Telephone Encounter (Signed)
Copied from CRM [id number]. Topic: General - Other >> Oct 18, 2017  1:18 PM Arlyss Gandyichardson, Taren N, NT wrote: Reason for CRM: Pt is wanting to see if another work note can be wrote but to state she has to sit the entire time she is at work. She states due to pain she cannot stand for even the 15 minutes.

## 2017-10-20 NOTE — Telephone Encounter (Signed)
Spoke with patient. Explained that Dr Jimmey RalphParker is out of the office today and as soon as he gets back to us we will call her and notify her. Patient stated understanding.

## 2017-10-20 NOTE — Telephone Encounter (Signed)
See note

## 2017-10-20 NOTE — Telephone Encounter (Signed)
Pt called to ask about the work note she is needing; pt was notified about pcp not being in office today; pt would at least like to be contacted to be updated about the situation

## 2017-10-24 NOTE — Telephone Encounter (Signed)
Ok with note stating patient needs to be sitting while at work.  Katina Degreealeb M. Jimmey RalphParker, MD 10/24/2017 8:07 AM

## 2017-10-25 NOTE — Telephone Encounter (Signed)
Letter has been printed, signed, and faxed to patient's place of employment.

## 2017-10-26 ENCOUNTER — Emergency Department (HOSPITAL_COMMUNITY): Payer: BLUE CROSS/BLUE SHIELD

## 2017-10-26 ENCOUNTER — Encounter (HOSPITAL_COMMUNITY): Payer: Self-pay | Admitting: Obstetrics and Gynecology

## 2017-10-26 ENCOUNTER — Other Ambulatory Visit: Payer: Self-pay

## 2017-10-26 ENCOUNTER — Emergency Department (HOSPITAL_COMMUNITY)
Admission: EM | Admit: 2017-10-26 | Discharge: 2017-10-26 | Disposition: A | Discharge status: Home / Self Care | Payer: BLUE CROSS/BLUE SHIELD | Attending: Emergency Medicine | Admitting: Emergency Medicine

## 2017-10-26 DIAGNOSIS — Z79899 Other long term (current) drug therapy: Secondary | ICD-10-CM | POA: Diagnosis not present

## 2017-10-26 DIAGNOSIS — M25562 Pain in left knee: Secondary | ICD-10-CM | POA: Insufficient documentation

## 2017-10-26 DIAGNOSIS — F17290 Nicotine dependence, other tobacco product, uncomplicated: Secondary | ICD-10-CM | POA: Diagnosis not present

## 2017-10-26 MED ORDER — DICLOFENAC SODIUM 1 % TD GEL
2.0000 g | Freq: Once | TRANSDERMAL | Status: AC
  Administered 2017-10-26: 2 g via TOPICAL
  Filled 2017-10-26: qty 100

## 2017-10-26 NOTE — ED Provider Notes (Signed)
[facility]-EMERGENCY DEPT Provider Note   CSN: [id number] Arrival date & time: 10/26/17  1459     History   Chief Complaint Chief Complaint  Patient presents with  . Knee Pain    HPI Melissa [name] is a 37 y.o. female.  Melissa [name] is a 37 y.o. Female with a history of fibromyalgia, migraines, IBS, asthma, presents to the emergency department for evaluation of left knee pain.  She reports this pain started about a week ago, no obvious trauma or twisting, but she reports she is up on the knee almost constantly at work.  Pain radiates up towards the hip but she does not have any pain at her hip or ankle.  She has noted some mild swelling to the knee but no overlying redness or warmth and no fever she is able to bend and extend the knee with some discomfort has remained ambulatory on it.  She takes ibuprofen and gabapentin for her fibromyalgia regularly has not tried anything else to treat the symptoms.  She reports a remote history of a left knee injury when she was younger from from work but no more recent injury, no surgeries.  Denies any numbness or tingling     Past Medical History:  Diagnosis Date  . Asthma   . IBS (irritable bowel syndrome)   . Migraine     Patient Active Problem List   Diagnosis Date Noted  . Depression, major, single episode, moderate (HCC) 09/22/2017  . Fibromyalgia 09/22/2017  . Abnormal mammogram 09/22/2017  . RHINITIS, ALLERGIC 03/30/2006  . ASTHMA, UNSPECIFIED 03/30/2006  . IRRITABLE BOWEL SYNDROME 03/30/2006  . PYELONEPHRITIS, ACUTE 03/30/2006  . MENSTRUATION, PAINFUL 03/30/2006  . METRORRHAGIA 03/30/2006    Past Surgical History:  Procedure Laterality Date  . [facility]  2008     OB History    Gravida      Para      Term      Preterm      AB      Living  0     SAB      TAB      Ectopic      Multiple      Live Births               Home Medications    Prior to Admission medications   Medication  Sig Start Date End Date Taking? Authorizing Provider  albuterol (PROVENTIL) (2.5 MG/3ML) 0.083% nebulizer solution Take 2.5 mg by nebulization every 6 (six) hours as needed for wheezing or shortness of breath (wheezing and sob).    [provider]  Ascorbic Acid (VITAMIN C) 1000 MG tablet Take 1,000 mg by mouth daily.    [provider]  cholecalciferol (VITAMIN D) 1000 units tablet Take 1,000 Units by mouth daily.    [provider]  COD LIVER OIL PO Take by mouth.    [provider]  cyclobenzaprine (FLEXERIL) 10 MG tablet Take by mouth. 01/05/12   [provider]  dicyclomine (BENTYL) 20 MG tablet  09/14/17   [provider]  escitalopram (LEXAPRO) 10 MG tablet Take 10 mg by mouth daily.    [provider]  gabapentin (NEURONTIN) 300 MG capsule Take 300 mg by mouth 3 (three) times daily.    [provider]  hydrOXYzine (VISTARIL) 50 MG capsule  09/11/17   [provider]  ibuprofen (ADVIL,MOTRIN) 800 MG tablet Take 800 mg by mouth every 8 (eight) hours as needed.  [provider]  ketoconazole (NIZORAL) 2 % cream Apply 1 application topically 2 (two) times daily as needed for irritation. 08/08/17   Asencion Islam, DPM  naproxen (NAPROSYN) 500 MG tablet Take 1 tablet (500 mg total) by mouth 2 (two) times daily with a meal. 09/16/16   Ardith Dark, MD  terbinafine (LAMISIL) 250 MG tablet Take 1 tablet (250 mg total) by mouth daily. 10/10/17   Asencion Islam, DPM    Family History Family History  Problem Relation Age of Onset  . Arthritis Mother   . Diabetes Mother   . High Cholesterol Mother   . Cancer Father   . High blood pressure Father   . High Cholesterol Sister   . High blood pressure Sister   . Kidney disease Maternal Grandmother   . Cancer Maternal Grandfather   . Heart attack Maternal Grandfather   . High Cholesterol Paternal Grandmother   . High blood pressure Paternal Grandmother   .  High blood pressure Paternal Grandfather     Social History Social History   Tobacco Use  . Smoking status: Current Every Day Smoker    Types: Cigars  . Smokeless tobacco: Never Used  . Tobacco comment: Black and Milds  Substance Use Topics  . Alcohol use: Yes  . Drug use: No     Allergies   Asa [aspirin] and Latex   Review of Systems Review of Systems  Constitutional: Negative for chills and fever.  Musculoskeletal: Positive for arthralgias and joint swelling.  Skin: Negative for color change and rash.  Neurological: Negative for weakness and numbness.     Physical Exam Updated Vital Signs BP (!) 139/103 (BP Location: Left Arm)   Pulse 85   Temp 98.4 F (36.9 C) (Oral)   Resp 15   Ht 5' 7.5" (1.715 m)   Wt 79.4 kg   LMP 10/25/2017   SpO2 99%   BMI 27.00 kg/m   Physical Exam  Constitutional: She appears well-developed and well-nourished. No distress.  HENT:  Head: Normocephalic and atraumatic.  Eyes: Right eye exhibits no discharge. Left eye exhibits no discharge.  Pulmonary/Chest: Effort normal. No respiratory distress.  Musculoskeletal:  Tenderness to palpation over the anterior joint line of the left knee, no appreciable deformity, no overlying swelling, redness or warmth.  Patient is able to bend and extend the knee greater than 90 degrees./5 strength.  No tenderness and full range of motion at the hip and ankle.  2+ DP and TP pulses  Neurological: She is alert. Coordination normal.  Skin: Skin is warm and dry. Capillary refill takes less than 2 seconds. She is not diaphoretic.  Psychiatric: She has a normal mood and affect. Her behavior is normal.  Nursing note and vitals reviewed.    ED Treatments / Results  Labs (all labs ordered are listed, but only abnormal results are displayed) Labs Reviewed - No data to display  EKG None  Radiology Dg Knee Complete 4 Views Left  Result Date: 10/26/2017 CLINICAL DATA:  Left knee pain. EXAM: LEFT KNEE -  COMPLETE 4+ VIEW COMPARISON:  No recent. FINDINGS: Mild patellofemoral degenerative change with subcortical cyst. Tiny Corticated bony density noted adjacent to the medial tibial spine. This may represent a small old fracture fragment or loose body. No other focal abnormality identified. No acute abnormality. No evidence of effusion. 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 Tiny corticated bony density noted adjacent to the medial tibial spine. This may represent a small old fracture  fragment or loose body. No acute abnormality identified. No evidence of effusion. Electronically Signed   By: Maisie Fus  Register   On: 10/26/2017 16:17    Procedures Procedures (including critical care time)  Medications Ordered in ED Medications  diclofenac sodium (VOLTAREN) 1 % transdermal gel 2 g (has no administration in time range)     Initial Impression / Assessment and Plan / ED Course  I have reviewed the triage vital signs and the nursing notes.  Pertinent labs & imaging results that were available during my care of the patient were reviewed by me and considered in my medical decision making (see chart for details).  Patient X-Ray negative for obvious fracture or dislocation, does show some degenerative changes and a subcortical cyst, likely from old fracture injury.  Pain managed in ED. Pt advised to follow up with orthopedics if symptoms persist for possibility of missed fracture diagnosis. Patient given brace and crutches as well as Voltaren gel while in ED, conservative therapy recommended and discussed. Patient will be dc home & is agreeable with above plan.   Final Clinical Impressions(s) / ED Diagnoses   Final diagnoses:  Acute pain of left knee    ED Discharge Orders    None       Dartha Lodge, [place] 10/26/17 1655    Shaune Pollack, MD 10/27/17 [phone]

## 2017-10-26 NOTE — Discharge Instructions (Signed)
Your x-ray shows some mild degenerative changes but no evidence of a fracture dislocation.  Please use Voltaren gel, ice, elevation, knee sleeve and crutches.  I would like free to follow-up with your primary care doctor and if pain persist you may need to follow-up with orthopedics.  Return for significantly worsened knee pain, redness, swelling or fevers, inability to bend and straighten the knee or any other new or concerning symptoms.

## 2017-10-26 NOTE — ED Triage Notes (Signed)
PT reports she is having pain in her left knee. Pt denies injury or twisting.  Pt reports she had pain in her left knee that started about a week ago and has gotten worse and is shooting up to her hip.

## 2017-11-23 NOTE — Progress Notes (Signed)
NEUROLOGY CONSULTATION NOTE  Melissa [name] MRN: [id number] DOB: [birth year]  Referring provider: Jacquiline Doe, MD Primary care provider: Jacquiline Doe, MD  Reason for consult:  migraines  HISTORY OF PRESENT ILLNESS: Melissa [name] is a 37 year old right-handed female with fibromyalgia and depression and anxiety who presents for migraines.  History supplemented by referring providers note.  Onset:  Around 71-59 years old Location:  At the crown/occipital region Quality:  sharp Intensity:  8-9/10.  She denies new headache, thunderclap headache Aura:  No Prodrome:  No Postdrome:  No Associated symptoms: Nausea, vomiting, photophobia, phonophobia, osmophobia, blurred vision.  She denies associated unilateral numbness or weakness. Duration:  2-3 days until her period starts Frequency:  Every month, starting 2-3 days before period Prior to starting supplements in early October, she had 15 headache days a month. Frequency of abortive medication: nothing for past month Triggers:  Menstrual cycle, emotional stress Relieving factors:  no Activity:  aggravates  Current NSAIDS: Naproxen 500 mg Current analgesics:  no Current triptans:  none Current ergotamine:  none Current anti-emetic:  none Current muscle relaxants:  Flexeril 10mg  Current anti-anxiolytic: Hydroxyzine 50 mg Current sleep aide:  Hydroxyzine Current Antihypertensive medications:  none Current Antidepressant medications: Lexapro 10 mg Current Anticonvulsant medications: Gabapentin 300 mg as needed Current anti-CGRP:  none Current Vitamins/Herbal/Supplements:  She is taking natural supplements (does not remember what they are). Current Antihistamines/Decongestants:  none Other therapy:  none Other medication:  none  Past NSAIDS:  Naproxen 500mg , ibuprofen  Past analgesics:  Tylenol, Excedrin Past abortive triptans:  Sumatriptan with naproxen (initially helpful) Past abortive ergotamine:  none Past muscle relaxants:   none Past anti-emetic:  none Past antihypertensive medications:  none Past antidepressant medications:  none Past anticonvulsant medications:  none Past anti-CGRP:  Emgality (effective but then headaches increased) Past vitamins/Herbal/Supplements:  none Past antihistamines/decongestants:  none Other past therapies:  none  Caffeine:1 cup coffee daily   Diet:  10 glasses water daily Exercise:  Not routine Depression:  yes; Anxiety:  yes Other pain:  She has diffuse body pain Sleep hygiene:  Better with hydroxyzine Family history of headache:  Mom  10/06/17 Hepatic Function Panel:  t bili 0.4, ALP 51, AST 22, ALT 27.  PAST MEDICAL HISTORY: Past Medical History:  Diagnosis Date  . Asthma   . IBS (irritable bowel syndrome)   . Migraine     PAST SURGICAL HISTORY: Past Surgical History:  Procedure Laterality Date  . [facility]  2008    MEDICATIONS: Current Outpatient Medications on File Prior to Visit  Medication Sig Dispense Refill  . albuterol (PROVENTIL) (2.5 MG/3ML) 0.083% nebulizer solution Take 2.5 mg by nebulization every 6 (six) hours as needed for wheezing or shortness of breath (wheezing and sob).    . Ascorbic Acid (VITAMIN C) 1000 MG tablet Take 1,000 mg by mouth daily.    . cholecalciferol (VITAMIN D) 1000 units tablet Take 1,000 Units by mouth daily.    . COD LIVER OIL PO Take by mouth.    . cyclobenzaprine (FLEXERIL) 10 MG tablet Take by mouth.    . dicyclomine (BENTYL) 20 MG tablet   10  . escitalopram (LEXAPRO) 10 MG tablet Take 10 mg by mouth daily.    Marland Kitchen gabapentin (NEURONTIN) 300 MG capsule Take 300 mg by mouth 3 (three) times daily.    . hydrOXYzine (VISTARIL) 50 MG capsule   0  . ibuprofen (ADVIL,MOTRIN) 800 MG tablet Take 800 mg by mouth every 8 (eight) hours as  needed.    Marland Kitchen ketoconazole (NIZORAL) 2 % cream Apply 1 application topically 2 (two) times daily as needed for irritation. 30 g 5  . naproxen (NAPROSYN) 500 MG tablet Take 1 tablet (500 mg total) by mouth  2 (two) times daily with a meal. 30 tablet 0  . terbinafine (LAMISIL) 250 MG tablet Take 1 tablet (250 mg total) by mouth daily. 90 tablet 0   No current facility-administered medications on file prior to visit.     ALLERGIES: Allergies  Allergen Reactions  . Asa [Aspirin]   . Latex     FAMILY HISTORY: Family History  Problem Relation Age of Onset  . Arthritis Mother   . Diabetes Mother   . High Cholesterol Mother   . Cancer Father   . High blood pressure Father   . High Cholesterol Sister   . High blood pressure Sister   . Kidney disease Maternal Grandmother   . Cancer Maternal Grandfather   . Heart attack Maternal Grandfather   . High Cholesterol Paternal Grandmother   . High blood pressure Paternal Grandmother   . High blood pressure Paternal Grandfather    SOCIAL HISTORY: Social History   Socioeconomic History  . Marital status: Single    Spouse name: Not on file  . Number of children: 0  . Years of education: Not on file  . Highest education level: Not on file  Occupational History  . Not on file  Social Needs  . Financial resource strain: Not on file  . Food insecurity:    Worry: Not on file    Inability: Not on file  . Transportation needs:    Medical: Not on file    Non-medical: Not on file  Tobacco Use  . Smoking status: Current Every Day Smoker    Types: Cigars  . Smokeless tobacco: Never Used  . Tobacco comment: Black and Milds  Substance and Sexual Activity  . Alcohol use: Yes  . Drug use: No  . Sexual activity: Not Currently  Lifestyle  . Physical activity:    Days per week: Not on file    Minutes per session: Not on file  . Stress: Not on file  Relationships  . Social connections:    Talks on phone: Not on file    Gets together: Not on file    Attends religious service: Not on file    Active member of club or organization: Not on file    Attends meetings of clubs or organizations: Not on file    Relationship status: Not on file  .  Intimate partner violence:    Fear of current or ex partner: Not on file    Emotionally abused: Not on file    Physically abused: Not on file    Forced sexual activity: Not on file  Other Topics Concern  . Not on file  Social History Narrative  . Not on file    REVIEW OF SYSTEMS: Constitutional: No fevers, chills, or sweats, no generalized fatigue, change in appetite Eyes: No visual changes, double vision, eye pain Ear, nose and throat: No hearing loss, ear pain, nasal congestion, sore throat Cardiovascular: No chest pain, palpitations Respiratory:  No shortness of breath at rest or with exertion, wheezes GastrointestinaI: No nausea, vomiting, diarrhea, abdominal pain, fecal incontinence Genitourinary:  No dysuria, urinary retention or frequency Musculoskeletal:  No neck pain, back pain Integumentary: No rash, pruritus, skin lesions Neurological: as above Psychiatric: No depression, insomnia, anxiety Endocrine: No palpitations, fatigue, diaphoresis,  mood swings, change in appetite, change in weight, increased thirst Hematologic/Lymphatic:  No purpura, petechiae. Allergic/Immunologic: no itchy/runny eyes, nasal congestion, recent allergic reactions, rashes  PHYSICAL EXAM: Vitals:   11/24/17 0915  BP: (!) 140/102  Pulse: 98  SpO2: 98%    General: No acute distress.  Patient appears well-groomed.   Head:  Normocephalic/atraumatic Eyes:  fundi examined but not visualized Neck: supple, no paraspinal tenderness, full range of motion Back: No paraspinal tenderness Heart: regular rate and rhythm Lungs: Clear to auscultation bilaterally. Vascular: No carotid bruits. Neurological Exam: Mental status: alert and oriented to person, place, and time, recent and remote memory intact, fund of knowledge intact, attention and concentration intact, speech fluent and not dysarthric, language intact. Cranial nerves: CN I: not tested CN II: pupils equal, round and reactive to light, visual  fields intact CN III, IV, VI:  full range of motion, no nystagmus, no ptosis CN V: facial sensation intact CN VII: upper and lower face symmetric CN VIII: hearing intact CN IX, X: gag intact, uvula midline CN XI: sternocleidomastoid and trapezius muscles intact CN XII: tongue midline Bulk & Tone: normal, no fasciculations. Motor:  5/5 throughout  Sensation:  temperature and vibration sensation intact.   Deep Tendon Reflexes:  2+ throughout, toes downgoing.   Finger to nose testing:  Without dysmetria.   Heel to shin:  Without dysmetria.   Gait:  Antalgic gait.  Uses cane. Romberg negative  IMPRESSION: Menstrually related migraines, not intractable Fibromyalgia Elevated blood pressure.  She says it is aggravated due to pain  PLAN: 1.  For mini preventative management, naproxen 500mg  twice daily beginning one week prior to period and for total of 14 days 2.  For abortive therapy, sumatriptan nasal spray (Tosymra) earliest onset of migraine and may repeat once after 1 hour if needed (not to exceed 2 sprays in 24 hours) 3.  Limit use of pain relievers to no more than 2 days out of week to prevent risk of rebound or medication-overuse headache. 4.  Keep headache diary 5.  Exercise, hydration, caffeine cessation, sleep hygiene, monitor for and avoid triggers 6.  Consider:  magnesium citrate 400mg  daily, riboflavin 400mg  daily, and coenzyme Q10 100mg  three times daily 7. Follow up blood pressure with PCP  8. Follow up in 3 to 4 months.  Thank you for allowing me to take part in the care of this patient.  Shon Millet, DO  CC: Jacquiline Doe, MD

## 2017-11-24 ENCOUNTER — Ambulatory Visit: Payer: BLUE CROSS/BLUE SHIELD | Admitting: Neurology

## 2017-11-24 ENCOUNTER — Encounter: Payer: Self-pay | Admitting: Neurology

## 2017-11-24 VITALS — BP 140/102 | HR 98 | Ht 67.0 in | Wt 177.5 lb

## 2017-11-24 DIAGNOSIS — G43829 Menstrual migraine, not intractable, without status migrainosus: Secondary | ICD-10-CM | POA: Diagnosis not present

## 2017-11-24 DIAGNOSIS — R030 Elevated blood-pressure reading, without diagnosis of hypertension: Secondary | ICD-10-CM

## 2017-11-24 DIAGNOSIS — M797 Fibromyalgia: Secondary | ICD-10-CM

## 2017-11-24 MED ORDER — NAPROXEN 500 MG PO TABS: ORAL_TABLET | ORAL | 3 refills | Status: DC

## 2017-11-24 NOTE — Progress Notes (Signed)
Tosymra samples given in office. This is a new medication, is not yet in the system. Lot # [id number] exp [id number] copay card provided to [facility]ien id# [id number] Rxbin# [id number] Rxpcn# [id number] Rxgrp# [id number] issure [phone]

## 2017-11-24 NOTE — Patient Instructions (Signed)
1.  Take naproxen 500mg  twice daily beginning one week before first day of your period and take for total of 14 days. 2.  At earlier onset of migraine, may take sumatriptan nasal spray (Tosymra).  I spray in one nostril.  May repeat once after 1 hour if needed (no more than 2 sprays in 24 hours) 3.  Find out what supplements you are taking and let me know 4.  Keep headache diary 5.  Follow up in 3 to 4 months

## 2017-12-06 ENCOUNTER — Telehealth: Payer: Self-pay | Admitting: Family Medicine

## 2017-12-06 NOTE — Telephone Encounter (Signed)
Please advise. Ok to place referral? 

## 2017-12-06 NOTE — Telephone Encounter (Signed)
Referral would need to be placed prior to [facility] checking with insurance for prior authorization.  Copied from CRM [id number]. Topic: Appointment Scheduling - Scheduling Inquiry for Clinic >> Dec 06, 2017  8:57 AM Baldo Daub L wrote: Reason for CRM:   Pt has seen an ortho surgeon (Dr. Quincy Simmonds) and it was recommended she do physical therapy.  Pt wants to know if she can do her physical therapy there with [facility]. Pt can be reached at [phone]

## 2017-12-06 NOTE — Telephone Encounter (Signed)
Forwarding

## 2017-12-07 ENCOUNTER — Other Ambulatory Visit: Payer: Self-pay

## 2017-12-07 DIAGNOSIS — M25561 Pain in right knee: Secondary | ICD-10-CM

## 2017-12-07 DIAGNOSIS — M25562 Pain in left knee: Principal | ICD-10-CM

## 2017-12-07 NOTE — Telephone Encounter (Signed)
Referral to PT has been placed

## 2017-12-07 NOTE — Telephone Encounter (Signed)
Ok with me. Please place any necessary orders. 

## 2017-12-19 ENCOUNTER — Ambulatory Visit: Payer: BLUE CROSS/BLUE SHIELD | Admitting: Physical Therapy

## 2018-01-01 ENCOUNTER — Encounter: Payer: Self-pay | Admitting: Physical Therapy

## 2018-01-01 ENCOUNTER — Ambulatory Visit (INDEPENDENT_AMBULATORY_CARE_PROVIDER_SITE_OTHER): Payer: BLUE CROSS/BLUE SHIELD | Admitting: Physical Therapy

## 2018-01-01 DIAGNOSIS — M25561 Pain in right knee: Secondary | ICD-10-CM

## 2018-01-01 DIAGNOSIS — R2689 Other abnormalities of gait and mobility: Secondary | ICD-10-CM

## 2018-01-01 DIAGNOSIS — M25562 Pain in left knee: Secondary | ICD-10-CM

## 2018-01-01 NOTE — Patient Instructions (Signed)
Access Code: [email or web address]  URL: [email or web address]  Date: 01/01/2018  Prepared by: Sedalia MutaLauren Kiyoko Mcguirt   Exercises  Supine Heel Slide - 10 reps - 2 sets - 1x daily  Long Sitting Quad Set - 10 reps - 2 sets - 1x daily  Straight Leg Raise - 10 reps - 2 sets - 1x daily  Sidelying Hip Abduction - 10 reps - 2 sets - 1x daily  Seated Long Arc Quad - 10 reps - 2 sets - 1x daily

## 2018-01-08 ENCOUNTER — Encounter: Payer: Self-pay | Admitting: Physical Therapy

## 2018-01-08 NOTE — Therapy (Signed)
[facility] [place] [facility] [facility] [address] [place], [place], [address] Phone: [phone]   Fax:  [phone]  Physical Therapy Evaluation  Patient Details  Name: Melissa [name] MRN: [id number] Date of Birth: May 15, 1980 Referring Provider (PT): Parker/ Quincy Simmonds   Encounter Date: 01/01/2018  PT End of Session - 01/08/18 1341    Visit Number  1    Number of Visits  12    Date for PT Re-Evaluation  02/12/18    Authorization Type  BCBS    PT Start Time  1510    PT Stop Time  1540    PT Time Calculation (min)  30 min    Activity Tolerance  Patient tolerated treatment well    Behavior During Therapy  [facility] for tasks assessed/performed       Past Medical History:  Diagnosis Date  . Asthma   . IBS (irritable bowel syndrome)   . Migraine     Past Surgical History:  Procedure Laterality Date  . [facility]  2008    There were no vitals filed for this visit.   Subjective Assessment - 01/08/18 1339    Subjective  Pt states increased pain in both knees, no incident to report. She reports pain for a few months, progressively getting worse. 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 She has not had previous pain.     Diagnostic tests  Recent X-Ray    Patient Stated Goals  decreased pain    Currently in Pain?  Yes    Pain Score  7     Pain Location  Knee    Pain Orientation  Right;Left    Pain Descriptors / Indicators  Aching    Pain Type  Acute pain    Pain Onset  More than a month ago    Pain Frequency  Intermittent    Aggravating Factors   unable to state    Pain Relieving Factors  Pain meds         [facility] PT Assessment - 01/08/18 1337      Assessment   Medical Diagnosis  Bil knee pain    Referring Provider (PT)  Parker/ Quincy Simmonds    Prior Therapy  no      Precautions   Precautions  None      Balance Screen   Has the patient fallen in the past 6 months  No      Prior Function   Level of Independence  Independent      Cognition   Overall Cognitive Status  Within Functional Limits for tasks assessed      AROM   Overall AROM Comments  AROM: Bil Knees: mild limitation for end range flexion= pain;  Hips: [employer]      Strength   Overall Strength Comments  Knee ext: 4-/5 bil (pain) ; Flexion 4/5 (pain);  Hips: 4-/5       Palpation   Palpation comment  Poor patella tracking bilaterally. Pain at anterior knee, patella tendon, bilaterally.       Ambulation/Gait   Gait Comments  Slow, cautious gait, decreased hip flexion, decreased knee extension, decreased heel strike; Antalgic gait;                 Objective measurements completed on examination: See above findings.      [facility] Adult PT Treatment/Exercise - 01/08/18 1337      Exercises   Exercises  Knee/Hip      Knee/Hip Exercises: Stretches   Active Hamstring  Stretch  --      Knee/Hip Exercises: Seated   Long Arc Quad  10 reps;Both      Knee/Hip Exercises: Supine   Quad Sets  10 reps;Both    Heel Slides  10 reps;Both    Straight Leg Raises  10 reps;Both      Knee/Hip Exercises: Sidelying   Hip ABduction  10 reps;Both             PT Education - 01/08/18 1340    Education Details  HEP , PT POC    Person(s) Educated  Patient    Methods  Explanation;Demonstration;Verbal cues    Comprehension  Verbalized understanding;Need further instruction       PT Short Term Goals - 01/08/18 1342      PT SHORT TERM GOAL #1   Title  Pt to report decreased pain in Bil knees to 4/10     Time  2    Period  Weeks    Status  New    Target Date  01/15/18      PT SHORT TERM GOAL #2   Title  Pt to be independent with initial HEP     Time  2    Period  Weeks    Status  New    Target Date  01/15/18        PT Long Term Goals - 01/08/18 1342      PT LONG TERM GOAL #1   Title  Pt to report decreased pain in Bil knees, to 0-2/10 with activity and at rest.     Time  6    Period  Weeks    Status  New    Target Date  02/12/18      PT LONG TERM GOAL  #2   Title  Pt to demo full, pain free ROM of Bil knees, to improve ability for IADLs.     Time  6    Period  Weeks    Status  New    Target Date  02/12/18      PT LONG TERM GOAL #3   Title  Pt to demo increased strength of bil knees and hips to at least 4+/5 to improve stability and pain.     Time  6    Period  Weeks    Status  New    Target Date  02/12/18      PT LONG TERM GOAL #4   Title  Pt to demo gait and stair mechanics to be [facility] for her age, to improve ability for community activities.     Time  6    Period  Weeks    Status  New    Target Date  02/12/18             Plan - 01/08/18 1342    Clinical Impression Statement  Pt presents with primary complaint of increased pain in Bilateral knees L>R. Symptoms consistent with patella tendonitis, with pain at anterior knee, patella tendon bilaterally. She has poor patella tracking bilaterally as well, and has painful ROM to end range of flexion. She has decreased strength of quads and hips, and will benefit from education on Ther ex and HEP for diagnosis. Pt with decreased ablity and endurance for ambulation, due to pain and weakness. She has decreased ability for full functional activities due to pain. Pt to beneift from skilled care to decreaes pain and maximize functiion.     Clinical Presentation  Stable  Clinical Decision Making  Low    Rehab Potential  Good    PT Frequency  2x / week    PT Duration  6 weeks    PT Treatment/Interventions  ADLs/Self Care Home Management;Cryotherapy;Electrical Stimulation;Iontophoresis 4mg /ml Dexamethasone;Moist Heat;Therapeutic activities;Functional mobility training;Stair training;Gait training;Ultrasound;Therapeutic exercise;Balance training;Neuromuscular re-education;Patient/family education;Dry needling;Passive range of motion;Manual techniques;Taping;Spinal Manipulations;Joint Manipulations    Consulted and Agree with Plan of Care  Patient       Patient will benefit from skilled  therapeutic intervention in order to improve the following deficits and impairments:  Abnormal gait, Decreased endurance, Pain, Decreased strength, Decreased activity tolerance, Decreased mobility, Difficulty walking, Decreased range of motion, Improper body mechanics  Visit Diagnosis: Pain in both knees, unspecified chronicity  Other abnormalities of gait and mobility     Problem List Patient Active Problem List   Diagnosis Date Noted  . Depression, major, single episode, moderate (HCC) 09/22/2017  . Fibromyalgia 09/22/2017  . Abnormal mammogram 09/22/2017  . RHINITIS, ALLERGIC 03/30/2006  . ASTHMA, UNSPECIFIED 03/30/2006  . IRRITABLE BOWEL SYNDROME 03/30/2006  . PYELONEPHRITIS, ACUTE 03/30/2006  . MENSTRUATION, PAINFUL 03/30/2006  . METRORRHAGIA 03/30/2006   Sedalia MutaLauren Demitria Hay, PT, DPT 1:43 PM  01/08/18    [facility] [address] [place], [place], [address] Phone: [phone]   Fax:  [phone]  Name: Madalyn RobRuby [name] MRN: [id number] Date of Birth: 28-Oct-1980

## 2018-01-09 ENCOUNTER — Ambulatory Visit (INDEPENDENT_AMBULATORY_CARE_PROVIDER_SITE_OTHER): Payer: BLUE CROSS/BLUE SHIELD | Admitting: Physical Therapy

## 2018-01-09 ENCOUNTER — Encounter: Payer: Self-pay | Admitting: Physical Therapy

## 2018-01-09 DIAGNOSIS — R2689 Other abnormalities of gait and mobility: Secondary | ICD-10-CM

## 2018-01-09 DIAGNOSIS — M25561 Pain in right knee: Secondary | ICD-10-CM

## 2018-01-09 DIAGNOSIS — M25562 Pain in left knee: Secondary | ICD-10-CM | POA: Diagnosis not present

## 2018-01-09 NOTE — Therapy (Signed)
[facility] [place] [address] [place], [place], [address] Phone: [phone]   Fax:  [phone]  Physical Therapy Treatment  Patient Details  Name: Melissa [name] MRN: [id number] Date of Birth: July 21, 1980 Referring Provider (PT): Parker/ Quincy Simmonds   Encounter Date: 01/09/2018  PT End of Session - 01/09/18 1253    Visit Number  2    Number of Visits  12    Date for PT Re-Evaluation  02/12/18    Authorization Type  BCBS    PT Start Time  1250    PT Stop Time  1334    PT Time Calculation (min)  44 min    Activity Tolerance  Patient tolerated treatment well    Behavior During Therapy  [facility] for tasks assessed/performed       Past Medical History:  Diagnosis Date  . Asthma   . IBS (irritable bowel syndrome)   . Migraine     Past Surgical History:  Procedure Laterality Date  . [facility]  2008    There were no vitals filed for this visit.  Subjective Assessment - 01/09/18 1246    Subjective  Pt states knees are "just a little sore today".  But rates at 7/10. She has been doing HEP.     Currently in Pain?  Yes    Pain Score  7     Pain Orientation  Right;Left    Pain Descriptors / Indicators  Aching    Pain Type  Acute pain    Pain Onset  More than a month ago    Pain Frequency  Intermittent                       [facility] Adult PT Treatment/Exercise - 01/09/18 1246      Ambulation/Gait   Gait Comments  --      Exercises   Exercises  Knee/Hip      Knee/Hip Exercises: Stretches   Active Hamstring Stretch  2 reps;30 seconds      Knee/Hip Exercises: Aerobic   Stationary Bike  L1 x 8 min;      Knee/Hip Exercises: Standing   Hip Flexion  20 reps;Both;Knee bent    Hip Abduction  2 sets;10 reps;Both      Knee/Hip Exercises: Seated   Long Arc Quad  Both;20 reps    Long Arc Quad Limitations  x10 no weight/ x10 with 1.5 weights/ Slow lowering for [employer];       Knee/Hip Exercises: Supine   Quad Sets  Both;20  reps    Heel Slides  10 reps;Both    Bridges  2 sets;10 reps    Straight Leg Raises  10 reps;Both;2 sets      Knee/Hip Exercises: Sidelying   Hip ABduction  10 reps;Both    Clams  x10 bil;       Manual Therapy   Manual Therapy  Taping    Kinesiotex  --   Bil Patella tracking , 2 1 strips;               PT Short Term Goals - 01/08/18 1342      PT SHORT TERM GOAL #1   Title  Pt to report decreased pain in Bil knees to 4/10     Time  2    Period  Weeks    Status  New    Target Date  01/15/18      PT SHORT TERM GOAL #2   Title  Pt to be independent with initial HEP     Time  2    Period  Weeks    Status  New    Target Date  01/15/18        PT Long Term Goals - 01/08/18 1342      PT LONG TERM GOAL #1   Title  Pt to report decreased pain in Bil knees, to 0-2/10 with activity and at rest.     Time  6    Period  Weeks    Status  New    Target Date  02/12/18      PT LONG TERM GOAL #2   Title  Pt to demo full, pain free ROM of Bil knees, to improve ability for IADLs.     Time  6    Period  Weeks    Status  New    Target Date  02/12/18      PT LONG TERM GOAL #3   Title  Pt to demo increased strength of bil knees and hips to at least 4+/5 to improve stability and pain.     Time  6    Period  Weeks    Status  New    Target Date  02/12/18      PT LONG TERM GOAL #4   Title  Pt to demo gait and stair mechanics to be [facility], to improve ability for community activities.     Time  6    Period  Weeks    Status  New    Target Date  02/12/18            Plan - 01/09/18 1333    Clinical Impression Statement  Pt with good ability for ther ex progression today, with minimal increase in pain. K-tape done for patella tracking, poor patella tracking noted with heel slides, with increased crepitus. Plan to progress as tolerated.     Rehab Potential  Good    PT Frequency  2x / week    PT Duration  6 weeks    PT Treatment/Interventions  ADLs/Self Care  Home Management;Cryotherapy;Electrical Stimulation;Iontophoresis 4mg /ml Dexamethasone;Moist Heat;Therapeutic activities;Functional mobility training;Stair training;Gait training;Ultrasound;Therapeutic exercise;Balance training;Neuromuscular re-education;Patient/family education;Dry needling;Passive range of motion;Manual techniques;Taping;Spinal Manipulations;Joint Manipulations    Consulted and Agree with Plan of Care  Patient       Patient will benefit from skilled therapeutic intervention in order to improve the following deficits and impairments:  Abnormal gait, Decreased endurance, Pain, Decreased strength, Decreased activity tolerance, Decreased mobility, Difficulty walking, Decreased range of motion, Improper body mechanics  Visit Diagnosis: Pain in both knees, unspecified chronicity  Other abnormalities of gait and mobility     Problem List Patient Active Problem List   Diagnosis Date Noted  . Depression, major, single episode, moderate (HCC) 09/22/2017  . Fibromyalgia 09/22/2017  . Abnormal mammogram 09/22/2017  . RHINITIS, ALLERGIC 03/30/2006  . ASTHMA, UNSPECIFIED 03/30/2006  . IRRITABLE BOWEL SYNDROME 03/30/2006  . PYELONEPHRITIS, ACUTE 03/30/2006  . MENSTRUATION, PAINFUL 03/30/2006  . METRORRHAGIA 03/30/2006   Sedalia MutaLauren Bethaney Oshana, PT, DPT 1:36 PM  01/09/18    [place] [address] [place], [place], [address] Phone: [phone]   Fax:  [phone]  Name: Melissa [name] MRN: [id number] Date of Birth: 16-May-1980

## 2018-01-11 ENCOUNTER — Encounter: Payer: Self-pay | Admitting: Physical Therapy

## 2018-01-11 ENCOUNTER — Ambulatory Visit (INDEPENDENT_AMBULATORY_CARE_PROVIDER_SITE_OTHER): Payer: BLUE CROSS/BLUE SHIELD | Admitting: Physical Therapy

## 2018-01-11 DIAGNOSIS — M25561 Pain in right knee: Secondary | ICD-10-CM

## 2018-01-11 DIAGNOSIS — M25562 Pain in left knee: Secondary | ICD-10-CM

## 2018-01-11 DIAGNOSIS — R2689 Other abnormalities of gait and mobility: Secondary | ICD-10-CM

## 2018-01-11 NOTE — Therapy (Signed)
[facility] [address] [place], [place], [address] Phone: [phone]   Fax:  [phone]  Physical Therapy Treatment  Patient Details  Name: Melissa [name] MRN: [id number] Date of Birth: 10-16-1980 Referring Provider (PT): Parker/ Quincy Simmondsobert Jones   Encounter Date: 01/11/2018  PT End of Session - 01/11/18 1248    Visit Number  3    Number of Visits  12    Date for PT Re-Evaluation  02/12/18    Authorization Type  BCBS    PT Start Time  1246    PT Stop Time  1335    PT Time Calculation (min)  49 min    Activity Tolerance  Patient tolerated treatment well    Behavior During Therapy  [facility] for tasks assessed/performed       Past Medical History:  Diagnosis Date  . Asthma   . IBS (irritable bowel syndrome)   . Migraine     Past Surgical History:  [facility] Date  [facility]    There were no vitals filed for this visit.  Subjective Assessment - 01/11/18 1247    Subjective  Pt states knees are "not good" today.     Currently in Pain?  Yes    Pain Score  7     Pain Location  Knee    Pain Orientation  Right;Left    Pain Descriptors / Indicators  Aching    Pain Type  Acute pain    Pain Onset  More than a month ago    Pain Frequency  Intermittent                       [facility] Adult PT Treatment/Exercise - 01/11/18 1248      Exercises   Exercises  Knee/Hip      Knee/Hip Exercises: Stretches   Active Hamstring Stretch  --      Knee/Hip Exercises: Aerobic   Stationary Bike  L1 x 8 min;      Knee/Hip Exercises: Standing   Heel Raises  15 reps    Hip Flexion  20 reps;Both;Knee bent    Hip Abduction  2 sets;10 reps;Both      Knee/Hip Exercises: Seated   Long Arc Quad  Both;20 reps    Long Arc Quad Limitations  1.5 lb      Knee/Hip Exercises: Supine   Quad Sets  --    Heel Slides  10 reps;Both    Bridges  2 sets;10 reps    Straight Leg Raises  10 reps;Both;2 sets      Knee/Hip Exercises:  Sidelying   Hip ABduction  --    Clams  x15 bil;       Manual Therapy   Manual Therapy  Taping;Soft tissue mobilization    Soft tissue mobilization  IASTM to lateral quat/bilaterally    Kinesiotex  --   Bil Patella tracking , 2 1 strips;               PT Short Term Goals - 01/08/18 1342      PT SHORT TERM GOAL #1   Title  Pt to report decreased pain in Bil knees to 4/10     Time  2    Period  Weeks    Status  New    Target Date  01/15/18      PT SHORT TERM GOAL #2   Title  Pt to be independent with initial HEP  Time  2    Period  Weeks    Status  New    Target Date  01/15/18        PT Long Term Goals - 01/08/18 1342      PT LONG TERM GOAL #1   Title  Pt to report decreased pain in Bil knees, to 0-2/10 with activity and at rest.     Time  6    Period  Weeks    Status  New    Target Date  02/12/18      PT LONG TERM GOAL #2   Title  Pt to demo full, pain free ROM of Bil knees, to improve ability for IADLs.     Time  6    Period  Weeks    Status  New    Target Date  02/12/18      PT LONG TERM GOAL #3   Title  Pt to demo increased strength of bil knees and hips to at least 4+/5 to improve stability and pain.     Time  6    Period  Weeks    Status  New    Target Date  02/12/18      PT LONG TERM GOAL #4   Title  Pt to demo gait and stair mechanics to be [facility] for her age, to improve ability for community activities.     Time  6    Period  Weeks    Status  New    Target Date  02/12/18            Plan - 01/11/18 1338    Clinical Impression Statement  IASTm done today to decrease tightness in lateral quad and improve tracking. K-Tape continued for same purpose. Ther ex progressed to improve quad and hip strength. Pt with most soreness and crepitus with heel slides, and knee flex/ext.     Rehab Potential  Good    PT Frequency  2x / week    PT Duration  6 weeks    PT Treatment/Interventions  ADLs/Self Care Home Management;Cryotherapy;Electrical  Stimulation;Iontophoresis 4mg /ml Dexamethasone;Moist Heat;Therapeutic activities;Functional mobility training;Stair training;Gait training;Ultrasound;Therapeutic exercise;Balance training;Neuromuscular re-education;Patient/family education;Dry needling;Passive range of motion;Manual techniques;Taping;Spinal Manipulations;Joint Manipulations    Consulted and Agree with Plan of Care  Patient       Patient will benefit from skilled therapeutic intervention in order to improve the following deficits and impairments:  Abnormal gait, Decreased endurance, Pain, Decreased strength, Decreased activity tolerance, Decreased mobility, Difficulty walking, Decreased range of motion, Improper body mechanics  Visit Diagnosis: Pain in both knees, unspecified chronicity  Other abnormalities of gait and mobility     Problem List Patient Active Problem List   Diagnosis Date Noted  . Depression, major, single episode, moderate (HCC) 09/22/2017  . Fibromyalgia 09/22/2017  . Abnormal mammogram 09/22/2017  . RHINITIS, ALLERGIC 03/30/2006  . ASTHMA, UNSPECIFIED 03/30/2006  . IRRITABLE BOWEL SYNDROME 03/30/2006  . PYELONEPHRITIS, ACUTE 03/30/2006  . MENSTRUATION, PAINFUL 03/30/2006  . METRORRHAGIA 03/30/2006    Sedalia Muta, PT, DPT 1:39 PM  01/11/18    [place] [facility] [address] [place], [place], [address] Phone: [phone]   Fax:  [phone]  Name: Melissa [name] MRN: [id number] Date of Birth: 1980-04-21

## 2018-01-16 ENCOUNTER — Encounter: Payer: BLUE CROSS/BLUE SHIELD | Admitting: Physical Therapy

## 2018-01-18 ENCOUNTER — Encounter: Payer: BLUE CROSS/BLUE SHIELD | Admitting: Physical Therapy

## 2018-02-06 ENCOUNTER — Encounter: Payer: Self-pay | Admitting: Physical Therapy

## 2018-02-06 ENCOUNTER — Ambulatory Visit (INDEPENDENT_AMBULATORY_CARE_PROVIDER_SITE_OTHER): Payer: Self-pay | Admitting: Physical Therapy

## 2018-02-06 DIAGNOSIS — M25561 Pain in right knee: Secondary | ICD-10-CM

## 2018-02-06 DIAGNOSIS — R2689 Other abnormalities of gait and mobility: Secondary | ICD-10-CM

## 2018-02-06 DIAGNOSIS — M25562 Pain in left knee: Secondary | ICD-10-CM

## 2018-02-06 NOTE — Therapy (Signed)
[facility] [place] [facility] [address] [place], [place], [address] Phone: [phone]   Fax:  [phone]  Physical Therapy Treatment  Patient Details  Name: Melissa [name] MRN: [id number] Date of Birth: October 30, 1980 Referring Provider (PT): Parker/ Quincy Simmonds   Encounter Date: 02/06/2018  PT End of Session - 02/06/18 1438    Visit Number  4    Number of Visits  12    Date for PT Re-Evaluation  02/12/18    Authorization Type  BCBS    PT Start Time  1433    PT Stop Time  1516    PT Time Calculation (min)  43 min    Activity Tolerance  Patient tolerated treatment well    Behavior During Therapy  [facility] for tasks assessed/performed       Past Medical History:  Diagnosis Date  . Asthma   . IBS (irritable bowel syndrome)   . Migraine     Past Surgical History:  Procedure Laterality Date  . [facility]  2008    There were no vitals filed for this visit.  Subjective Assessment - 02/06/18 1435    Subjective  Pt last seen 12/12, over 3 weeks ago.  She states "[birth year] my [birth year]". She states no improvement in knee pain, but has been doing HEP.     Currently in Pain?  Yes    Pain Score  7     Pain Location  Knee    Pain Orientation  Right;Left    Pain Descriptors / Indicators  Aching    Pain Type  Acute pain    Pain Onset  More than a month ago    Pain Frequency  Intermittent    Aggravating Factors   standing, walking.                        [facility] Adult PT Treatment/Exercise - 02/06/18 1439      Exercises   Exercises  Knee/Hip      Knee/Hip Exercises: Aerobic   Stationary Bike  L2 x 8 min;      Knee/Hip Exercises: Standing   Heel Raises  --    Hip Flexion  20 reps;Both;Knee bent    Hip Abduction  2 sets;10 reps;Both    Abduction Limitations  YTB    Lateral Step Up  10 reps;Both;Step Height: 6";Hand Hold: 0    Forward Step Up  10 reps;Both;Step Height: 6";Hand Hold: 0    Other Standing Knee Exercises  Walk/March 15 ft x6;       Knee/Hip Exercises: Seated   Long Arc Quad  Both;20 reps    Long Arc Quad Limitations  1.5 lb      Knee/Hip Exercises: Supine   Heel Slides  10 reps;Left    Bridges  --    Straight Leg Raises  10 reps;Both;2 sets      Knee/Hip Exercises: Sidelying   Hip ABduction  20 reps;Both    Clams  x20 bil;       Modalities   Modalities  Iontophoresis      Iontophoresis   Type of Iontophoresis  Dexamethasone    Location  L patella tendon    Time  6 hr patch      Manual Therapy   Manual Therapy  Taping;Soft tissue mobilization    Soft tissue mobilization  IASTM to L lateral an mid quad, and patella tendon    Kinesiotex  --  PT Short Term Goals - 01/08/18 1342      PT SHORT TERM GOAL #1   Title  Pt to report decreased pain in Bil knees to 4/10     Time  2    Period  Weeks    Status  New    Target Date  01/15/18      PT SHORT TERM GOAL #2   Title  Pt to be independent with initial HEP     Time  2    Period  Weeks    Status  New    Target Date  01/15/18        PT Long Term Goals - 01/08/18 1342      PT LONG TERM GOAL #1   Title  Pt to report decreased pain in Bil knees, to 0-2/10 with activity and at rest.     Time  6    Period  Weeks    Status  New    Target Date  02/12/18      PT LONG TERM GOAL #2   Title  Pt to demo full, pain free ROM of Bil knees, to improve ability for IADLs.     Time  6    Period  Weeks    Status  New    Target Date  02/12/18      PT LONG TERM GOAL #3   Title  Pt to demo increased strength of bil knees and hips to at least 4+/5 to improve stability and pain.     Time  6    Period  Weeks    Status  New    Target Date  02/12/18      PT LONG TERM GOAL #4   Title  Pt to demo gait and stair mechanics to be [facility] for her [facility], to improve ability for community activities.     Time  6    Period  Weeks    Status  New    Target Date  02/12/18            Plan - 02/06/18 1612    Clinical Impression Statement  Pt  states continued pain in bil knees, R >L. She has crepitus on L with heel slide, but has improved ability for full knee flexion. She has significant soreness at anterior knee, patella tendon. IASTM and Ionto done today for pain. Discussed attendance and need to come to appointments consistently with pt today.     Rehab Potential  Good    PT Frequency  2x / week    PT Duration  6 weeks    PT Treatment/Interventions  ADLs/Self Care Home Management;Cryotherapy;Electrical Stimulation;Iontophoresis 4mg /ml Dexamethasone;Moist Heat;Therapeutic activities;Functional mobility training;Stair training;Gait training;Ultrasound;Therapeutic exercise;Balance training;Neuromuscular re-education;Patient/family education;Dry needling;Passive range of motion;Manual techniques;Taping;Spinal Manipulations;Joint Manipulations    Consulted and Agree with Plan of Care  Patient       Patient will benefit from skilled therapeutic intervention in order to improve the following deficits and impairments:  Abnormal gait, Decreased endurance, Pain, Decreased strength, Decreased activity tolerance, Decreased mobility, Difficulty walking, Decreased range of motion, Improper body mechanics  Visit Diagnosis: Pain in both knees, unspecified chronicity  Other abnormalities of gait and mobility     Problem List Patient Active Problem List   Diagnosis Date Noted  . Depression, major, single episode, moderate (HCC) 09/22/2017  . Fibromyalgia 09/22/2017  . Abnormal mammogram 09/22/2017  . RHINITIS, ALLERGIC 03/30/2006  . ASTHMA, UNSPECIFIED 03/30/2006  . IRRITABLE BOWEL SYNDROME 03/30/2006  .  PYELONEPHRITIS, ACUTE 03/30/2006  . MENSTRUATION, PAINFUL 03/30/2006  . METRORRHAGIA 03/30/2006    Sedalia Muta, PT, DPT 4:14 PM  02/06/18    [facility] [address] [place], [place], [address] Phone: [phone]   Fax:  [phone]  Name: Melissa [name] MRN: [id number] Date of  Birth: [birth year]

## 2018-02-08 ENCOUNTER — Encounter: Payer: Self-pay | Admitting: Physical Therapy

## 2018-02-08 ENCOUNTER — Encounter: Payer: Self-pay | Admitting: Family Medicine

## 2018-02-08 ENCOUNTER — Ambulatory Visit (INDEPENDENT_AMBULATORY_CARE_PROVIDER_SITE_OTHER): Payer: Self-pay | Admitting: Physical Therapy

## 2018-02-08 ENCOUNTER — Ambulatory Visit (INDEPENDENT_AMBULATORY_CARE_PROVIDER_SITE_OTHER): Payer: BLUE CROSS/BLUE SHIELD

## 2018-02-08 DIAGNOSIS — R2689 Other abnormalities of gait and mobility: Secondary | ICD-10-CM

## 2018-02-08 DIAGNOSIS — M25561 Pain in right knee: Secondary | ICD-10-CM

## 2018-02-08 DIAGNOSIS — Z23 Encounter for immunization: Secondary | ICD-10-CM | POA: Diagnosis not present

## 2018-02-08 DIAGNOSIS — M25562 Pain in left knee: Secondary | ICD-10-CM

## 2018-02-08 NOTE — Therapy (Signed)
[facility] [address] [place], [place], [address] Phone: [phone]   Fax:  [phone]  Physical Therapy Treatment  Patient Details  Name: Melissa [name] MRN: [id number] Date of Birth: 14-May-1980 Referring Provider (PT): Parker/ Quincy Simmondsobert Jones   Encounter Date: 02/08/2018  PT End of Session - 02/08/18 1520    Visit Number  5    Number of Visits  12    Date for PT Re-Evaluation  02/12/18    Authorization Type  BCBS    PT Start Time  1432    PT Stop Time  1514    PT Time Calculation (min)  42 min    Activity Tolerance  Patient tolerated treatment well    Behavior During Therapy  [facility] for tasks assessed/performed       Past Medical History:  Diagnosis Date  . Asthma   . IBS (irritable bowel syndrome)   . Migraine     Past [facility] History:  [facility]    There were no vitals filed for this visit.  Subjective Assessment - 02/08/18 1520    Subjective  Pt states she is sore "allover" today from Fibromyalgia.     Currently in Pain?  Yes    Pain Score  7     Pain Location  Knee    Pain Orientation  Right;Left    Pain Descriptors / Indicators  Aching    Pain Type  Acute pain    Pain Onset  More than a month ago    Pain Frequency  Intermittent                       [facility] Adult PT Treatment/Exercise - 02/08/18 1441      Exercises   Exercises  Knee/Hip      Knee/Hip Exercises: Aerobic   Stationary Bike  L2 x 8 min;      Knee/Hip Exercises: Standing   Hip Flexion  20 reps;Both;Knee bent    Hip Abduction  2 sets;10 reps;Both    Abduction Limitations  YTB    Lateral Step Up  10 reps;Both;Step Height: 6";Hand Hold: 0    Forward Step Up  --    Other Standing Knee Exercises  Walk/March 15 ft x6; fwd/bwd;     Other Standing Knee Exercises  Mini squats x15      Knee/Hip Exercises: Seated   Long Arc Quad  Both;20 reps    Long Arc Quad Limitations  2.5 lb      Knee/Hip  Exercises: Supine   Heel Slides  10 reps;Left    Bridges  2 sets;10 reps    Straight Leg Raises  10 reps;Both;2 sets      Knee/Hip Exercises: Sidelying   Hip ABduction  20 reps;Both    Clams  --      Modalities   Modalities  Iontophoresis      Iontophoresis   Type of Iontophoresis  Dexamethasone    Location  L patella tendon    Time  6 hr patch      Manual Therapy   Manual Therapy  Taping;Soft tissue mobilization    Soft tissue mobilization  --               PT Short Term Goals - 01/08/18 1342      PT SHORT TERM GOAL #1   Title  Pt to report decreased pain in Bil knees to 4/10  Time  2    Period  Weeks    Status  New    Target Date  01/15/18      PT SHORT TERM GOAL #2   Title  Pt to be independent with initial HEP     Time  2    Period  Weeks    Status  New    Target Date  01/15/18        PT Long Term Goals - 01/08/18 1342      PT LONG TERM GOAL #1   Title  Pt to report decreased pain in Bil knees, to 0-2/10 with activity and at rest.     Time  6    Period  Weeks    Status  New    Target Date  02/12/18      PT LONG TERM GOAL #2   Title  Pt to demo full, pain free ROM of Bil knees, to improve ability for IADLs.     Time  6    Period  Weeks    Status  New    Target Date  02/12/18      PT LONG TERM GOAL #3   Title  Pt to demo increased strength of bil knees and hips to at least 4+/5 to improve stability and pain.     Time  6    Period  Weeks    Status  New    Target Date  02/12/18      PT LONG TERM GOAL #4   Title  Pt to demo gait and stair mechanics to be [facility] for her age, to improve ability for community activities.     Time  6    Period  Weeks    Status  New    Target Date  02/12/18            Plan - 02/08/18 1521    Clinical Impression Statement  Pt with good ability for exercises, but does have audible popping/catching of L knee 2x during standing activities.  Pt with increased catching sensation with transition movements,  when moving knee through full ROM., and has significant pain when this happens. Ionto continued for pain. Plan to progress as tolerated.     Rehab Potential  Good    PT Frequency  2x / week    PT Duration  6 weeks    PT Treatment/Interventions  ADLs/Self Care Home Management;Cryotherapy;Electrical Stimulation;Iontophoresis 4mg /ml Dexamethasone;Moist Heat;Therapeutic activities;Functional mobility training;Stair training;Gait training;Ultrasound;Therapeutic exercise;Balance training;Neuromuscular re-education;Patient/family education;Dry needling;Passive range of motion;Manual techniques;Taping;Spinal Manipulations;Joint Manipulations    Consulted and Agree with Plan of Care  Patient       Patient will benefit from skilled therapeutic intervention in order to improve the following deficits and impairments:  Abnormal gait, Decreased endurance, Pain, Decreased strength, Decreased activity tolerance, Decreased mobility, Difficulty walking, Decreased range of motion, Improper body mechanics  Visit Diagnosis: Pain in both knees, unspecified chronicity  Other abnormalities of gait and mobility     Problem List Patient Active Problem List   Diagnosis Date Noted  . Depression, major, single episode, moderate (HCC) 09/22/2017  . Fibromyalgia 09/22/2017  . Abnormal mammogram 09/22/2017  . RHINITIS, ALLERGIC 03/30/2006  . ASTHMA, UNSPECIFIED 03/30/2006  . IRRITABLE BOWEL SYNDROME 03/30/2006  . PYELONEPHRITIS, ACUTE 03/30/2006  . MENSTRUATION, PAINFUL 03/30/2006  . METRORRHAGIA 03/30/2006    Sedalia Muta, PT, DPT 3:23 PM  02/08/18    [facility] [address] [place], [place], [address]  Phone: [phone]   Fax:  [phone]  Name: Melissa [name] MRN: [id number] Date of Birth: 1980-07-29

## 2018-02-13 ENCOUNTER — Encounter: Payer: Self-pay | Admitting: Physical Therapy

## 2018-02-13 ENCOUNTER — Ambulatory Visit (INDEPENDENT_AMBULATORY_CARE_PROVIDER_SITE_OTHER): Payer: BLUE CROSS/BLUE SHIELD | Admitting: Physical Therapy

## 2018-02-13 DIAGNOSIS — M25561 Pain in right knee: Secondary | ICD-10-CM

## 2018-02-13 DIAGNOSIS — R2689 Other abnormalities of gait and mobility: Secondary | ICD-10-CM

## 2018-02-13 DIAGNOSIS — M25562 Pain in left knee: Secondary | ICD-10-CM

## 2018-02-13 NOTE — Therapy (Addendum)
[facility] [address] [place], [place], [address] Phone: [phone]   Fax:  [phone]  Physical Therapy Treatment  Patient Details  Name: Melissa [name] MRN: [id number] Date of Birth: 1980/02/14 Referring Provider (PT): Parker/ Jari Pigg   Encounter Date: 02/13/2018  PT End of Session - 02/13/18 1514    Visit Number  6    Number of Visits  12    Date for PT Re-Evaluation  02/12/18    Authorization Type  BCBS    PT Start Time  1312    PT Stop Time  1358    PT Time Calculation (min)  46 min    Activity Tolerance  Patient tolerated treatment well    Behavior During Therapy  [facility] for tasks assessed/performed       Past Medical History:  Diagnosis Date  . Asthma   . IBS (irritable bowel syndrome)   . Migraine     Past Surgical History:  Procedure Laterality Date  . [facility]  2008    There were no vitals filed for this visit.  Subjective Assessment - 02/13/18 1503    Subjective  Pt states knees are "allright" today.     Currently in Pain?  Yes    Pain Score  7     Pain Location  Knee    Pain Orientation  Right;Left    Pain Descriptors / Indicators  Aching    Pain Type  Acute pain    Pain Onset  1 to 4 weeks ago    Pain Frequency  Intermittent                       [facility] Adult PT Treatment/Exercise - 02/13/18 1516      Exercises   Exercises  Knee/Hip      Knee/Hip Exercises: Aerobic   Stationary Bike  L2 x 9 min;      Knee/Hip Exercises: Standing   Hip Flexion  --    Hip Abduction  2 sets;10 reps;Both    Abduction Limitations  YTB    Lateral Step Up  10 reps;Both;Step Height: 6";Hand Hold: 0    Other Standing Knee Exercises  Walk/March 15 ft x6; fwd/bwd;     Other Standing Knee Exercises  Mini squats x15      Knee/Hip Exercises: Seated   Long Arc Quad  Both;20 reps    Long Arc Quad Limitations  2.5 lb      Knee/Hip Exercises: Supine   Heel Slides  10 reps;Left    Bridges  2 sets;10 reps     Straight Leg Raises  10 reps;Both;2 sets      Knee/Hip Exercises: Sidelying   Hip ABduction  20 reps;Both    Clams  x20 bil;       Modalities   Modalities  --      Iontophoresis   Type of Iontophoresis  --    Location  --    Time  --      Manual Therapy   Manual Therapy  Taping;Soft tissue mobilization    Kinesiotex  --   Y strip on L for Patella tracking;               PT Short Term Goals - 02/13/18 1545      PT SHORT TERM GOAL #1   Title  Pt to report decreased pain in Bil knees to 4/10     Time  2    Period  Weeks    Status  On-going      PT SHORT TERM GOAL #2   Title  Pt to be independent with initial HEP     Time  2    Period  Weeks    Status  Achieved        PT Long Term Goals - 01/08/18 1342      PT LONG TERM GOAL #1   Title  Pt to report decreased pain in Bil knees, to 0-2/10 with activity and at rest.     Time  6    Period  Weeks    Status  New    Target Date  02/12/18      PT LONG TERM GOAL #2   Title  Pt to demo full, pain free ROM of Bil knees, to improve ability for IADLs.     Time  6    Period  Weeks    Status  New    Target Date  02/12/18      PT LONG TERM GOAL #3   Title  Pt to demo increased strength of bil knees and hips to at least 4+/5 to improve stability and pain.     Time  6    Period  Weeks    Status  New    Target Date  02/12/18      PT LONG TERM GOAL #4   Title  Pt to demo gait and stair mechanics to be [facility] for her age, to improve ability for community activities.     Time  6    Period  Weeks    Status  New    Target Date  02/12/18            Plan - 02/13/18 1650    Clinical Impression Statement  Pt continues to have difficulty with taking knee through full ROM, due to catching and pain. Taping continued for improving  patella tracking. Pt also educated on brace positioning and wear as needed for pain. Pt able to perform ther ex, but has increased pain with step ups and squats today.  Plan to progress as  able.     Rehab Potential  Good    PT Frequency  2x / week    PT Duration  6 weeks    PT Treatment/Interventions  ADLs/Self Care Home Management;Cryotherapy;Electrical Stimulation;Iontophoresis 84m/ml Dexamethasone;Moist Heat;Therapeutic activities;Functional mobility training;Stair training;Gait training;Ultrasound;Therapeutic exercise;Balance training;Neuromuscular re-education;Patient/family education;Dry needling;Passive range of motion;Manual techniques;Taping;Spinal Manipulations;Joint Manipulations    Consulted and Agree with Plan of Care  Patient       Patient will benefit from skilled therapeutic intervention in order to improve the following deficits and impairments:  Abnormal gait, Decreased endurance, Pain, Decreased strength, Decreased activity tolerance, Decreased mobility, Difficulty walking, Decreased range of motion, Improper body mechanics  Visit Diagnosis: Pain in both knees, unspecified chronicity  Other abnormalities of gait and mobility     Problem List Patient Active Problem List   Diagnosis Date Noted  . Depression, major, single episode, moderate ([place] 09/22/2017  . Fibromyalgia 09/22/2017  . Abnormal mammogram 09/22/2017  . RHINITIS, ALLERGIC 03/30/2006  . ASTHMA, UNSPECIFIED 03/30/2006  . IRRITABLE BOWEL SYNDROME 03/30/2006  . PYELONEPHRITIS, ACUTE 03/30/2006  . MENSTRUATION, PAINFUL 03/30/2006  . METRORRHAGIA 03/30/2006    LLyndee Hensen PT, DPT 4:52 PM  02/13/18    [place] [address]Phone: [phone]  Fax:  [phone] Name: Melissa AllardMRN: [id number]  of Birth: 1980-08-21    PHYSICAL THERAPY DISCHARGE SUMMARY  Visits from Start of Care: 6   Plan: Patient agrees to discharge.  Patient goals were partially met. Patient is being discharged due to not returning since the last visit.  ?????       Lyndee Hensen, PT, DPT 11:44 AM   04/12/18

## 2018-02-20 ENCOUNTER — Encounter: Payer: BLUE CROSS/BLUE SHIELD | Admitting: Physical Therapy

## 2018-03-06 ENCOUNTER — Ambulatory Visit: Payer: BLUE CROSS/BLUE SHIELD | Admitting: Sports Medicine

## 2018-03-06 DIAGNOSIS — Z79899 Other long term (current) drug therapy: Secondary | ICD-10-CM

## 2018-03-06 DIAGNOSIS — B353 Tinea pedis: Secondary | ICD-10-CM

## 2018-03-06 DIAGNOSIS — L819 Disorder of pigmentation, unspecified: Secondary | ICD-10-CM | POA: Diagnosis not present

## 2018-03-06 NOTE — Progress Notes (Signed)
Subjective: Melissa [name] is a 38 y.o. female patient seen today in office for follow up evaluation of nail fungus on Lamisil. Patient states that she is doing well with no adverse reaction and now has finished the medication. Feels 80% improved but still is dealing with dry skin on both feet and areas of dark spots that she wants me to check to see if I think this will go away. Patient has no other pedal complaints at this time.   Patient Active Problem List   Diagnosis Date Noted  . Depression, major, single episode, moderate (HCC) 09/22/2017  . Fibromyalgia 09/22/2017  . Abnormal mammogram 09/22/2017  . RHINITIS, ALLERGIC 03/30/2006  . ASTHMA, UNSPECIFIED 03/30/2006  . IRRITABLE BOWEL SYNDROME 03/30/2006  . PYELONEPHRITIS, ACUTE 03/30/2006  . MENSTRUATION, PAINFUL 03/30/2006  . METRORRHAGIA 03/30/2006    Current Outpatient Medications on File Prior to Visit  Medication Sig Dispense Refill  . albuterol (PROVENTIL) (2.5 MG/3ML) 0.083% nebulizer solution Take 2.5 mg by nebulization every 6 (six) hours as needed for wheezing or shortness of breath (wheezing and sob).    . Ascorbic Acid (VITAMIN C) 1000 MG tablet Take 1,000 mg by mouth daily.    . cholecalciferol (VITAMIN D) 1000 units tablet Take 1,000 Units by mouth daily.    . COD LIVER OIL PO Take by mouth.    . cyclobenzaprine (FLEXERIL) 10 MG tablet Take by mouth.    . dicyclomine (BENTYL) 20 MG tablet   10  . escitalopram (LEXAPRO) 10 MG tablet Take 10 mg by mouth daily.    Marland Kitchen gabapentin (NEURONTIN) 300 MG capsule Take 300 mg by mouth 3 (three) times daily.    . hydrOXYzine (VISTARIL) 50 MG capsule   0  . ibuprofen (ADVIL,MOTRIN) 800 MG tablet Take 800 mg by mouth every 8 (eight) hours as needed.    Marland Kitchen ketoconazole (NIZORAL) 2 % cream Apply 1 application topically 2 (two) times daily as needed for irritation. 30 g 5  . meclizine (ANTIVERT) 25 MG tablet     . naproxen (NAPROSYN) 500 MG tablet Take 1 tablet twice daily for 14 days  starting 1 week prior to first day of period. 28 tablet 3  . pantoprazole (PROTONIX) 40 MG tablet   10  . terbinafine (LAMISIL) 250 MG tablet Take 1 tablet (250 mg total) by mouth daily. 90 tablet 0   No current facility-administered medications on file prior to visit.     Allergies  Allergen Reactions  . Asa [Aspirin]   . Latex   . Penicillins   . Vitamin E Hives    Objective: Physical Exam  General: Well developed, nourished, no acute distress, awake, alert and oriented x 3  Vascular: Dorsalis pedis artery 2/4 bilateral, Posterior tibial artery 2/4 bilateral, skin temperature warm to warm proximal to distal bilateral lower extremities, no varicosities, pedal hair present bilateral.  Neurological: Gross sensation present via light touch bilateral.   Dermatological: Skin is warm, dry, and supple bilateral, Nails 1-10 are tender, short thick, and discolored with mild subungal debris and early clearance noted at proximal nail bed, mild dry skin on the bottom of plantar surfaces with small areas of hyperpigmentation consistent with benign finding, and African-Americans, no webspace macerations present bilateral, no open lesions present bilateral, no callus/corns/hyperkeratotic tissue present bilateral. No signs of infection bilateral.  Musculoskeletal: No symptomatic boney deformities noted bilateral. Muscular strength within normal limits without painon range of motion. No pain with calf compression bilateral.  Assessment and Plan:  Problem  List Items Addressed This Visit    None    Visit Diagnoses    Encounter for long-term (current) use of medications    -  Primary   Tinea pedis of both feet       Hyperpigmentation          -Examined patient -Lamisil completed -Advised patient to use soaking healthy feet or good skin cream for dryness -Advised patient if she notices itchiness or sweatiness to also add on Lamisil spray over-the-counter daily until symptoms have  resolved -Advised good hygiene habits and educated patient on proper foot care to prevent re-infection -Patient to return as needed or sooner if symptoms worsen.  Asencion Islam, DPM

## 2018-03-07 ENCOUNTER — Encounter: Payer: Self-pay | Admitting: Sports Medicine

## 2018-03-14 ENCOUNTER — Telehealth: Payer: Self-pay | Admitting: Family Medicine

## 2018-03-14 ENCOUNTER — Other Ambulatory Visit: Payer: Self-pay

## 2018-03-14 MED ORDER — MECLIZINE HCL 25 MG PO TABS: 25.0000 mg | ORAL_TABLET | Freq: Two times a day (BID) | ORAL | 0 refills | Status: DC | PRN

## 2018-03-14 NOTE — Telephone Encounter (Signed)
Please advise.  Ok to fill? 

## 2018-03-14 NOTE — Telephone Encounter (Signed)
Rx sent to pharmacy   

## 2018-03-14 NOTE — Telephone Encounter (Signed)
Ok with me - medication is also available OTC.  Katina Degree. [name], MD 03/14/2018 10:53 AM

## 2018-03-14 NOTE — Telephone Encounter (Signed)
We have not received a refill request from this patient for this medication.  Dr. [name] has never filled this medication for this patient before.

## 2018-03-14 NOTE — Telephone Encounter (Signed)
Copied from CRM [id number]. Topic: Quick Communication - Rx Refill/Question >> Mar 14, 2018  9:36 AM Wyonia Hough E wrote: Medication: meclizine (ANTIVERT) 25 MG tablet - Pt wanted to know why her refill request was denied   Has the patient contacted their pharmacy? Yes - Pharmacy stated office would fill Rx   Preferred Pharmacy (with phone number or street name): [facility] [phone] [place], [place] - [address] [phone] (Phone) [phone] (Fax)    Agent: Please be advised that RX refills may take up to 3 business days. We ask that you follow-up with your pharmacy.

## 2018-04-06 ENCOUNTER — Ambulatory Visit: Payer: BLUE CROSS/BLUE SHIELD | Admitting: Neurology

## 2018-05-31 ENCOUNTER — Ambulatory Visit (INDEPENDENT_AMBULATORY_CARE_PROVIDER_SITE_OTHER): Payer: BLUE CROSS/BLUE SHIELD | Admitting: Family Medicine

## 2018-05-31 DIAGNOSIS — R059 Cough, unspecified: Secondary | ICD-10-CM

## 2018-05-31 DIAGNOSIS — J4520 Mild intermittent asthma, uncomplicated: Secondary | ICD-10-CM

## 2018-05-31 DIAGNOSIS — R05 Cough: Secondary | ICD-10-CM

## 2018-05-31 MED ORDER — GUAIFENESIN-CODEINE 100-10 MG/5ML PO SOLN: 5.0000 mL | Freq: Three times a day (TID) | ORAL | 0 refills | Status: DC | PRN

## 2018-05-31 MED ORDER — PREDNISONE 50 MG PO TABS: ORAL_TABLET | ORAL | 0 refills | Status: DC

## 2018-05-31 MED ORDER — ALBUTEROL SULFATE HFA 108 (90 BASE) MCG/ACT IN AERS: 2.0000 | INHALATION_SPRAY | Freq: Four times a day (QID) | RESPIRATORY_TRACT | 0 refills | Status: DC | PRN

## 2018-05-31 NOTE — Progress Notes (Signed)
    Chief Complaint:  Melissa [name] is a 38 y.o. female who presents today for a virtual office visit with a chief complaint of cough.   Assessment/Plan:  Cough No red flags.  Symptoms not consistent with COVID-19.  Possible mild viral upper respiratory infection.  She does have some worsening asthma symptoms however given her wheeze and some shortness of breath.  Will refill her albuterol inhaler.  We will start her on prednisone burst for her mild asthma flare.  We will also send in guaifenesin-codeine cough syrup to help her with her cough.  Encouraged good oral hydration.  Discussed reasons to return to care.  Follow-up as needed.    Subjective:  HPI:  Cough Symptoms started 2 days ago.  They have been stable over that time.  Associated with significant amount of rhinorrheaAnd chest congestion.  She has also had some increased shortness of breath and wheezing.  She has tried using her home nebulizer with no significant improvement.  Symptoms seem to be much worse when lying down.  No fevers or chills.  No body aches.  No known sick contacts.  She has had some left-sided rib pain that she thinks is from her cough.  No obvious alleviating or aggravating factors.  ROS: Per HPI  PMH: She reports that she has been smoking cigars. She has never used smokeless tobacco. She reports current alcohol use. She reports that she does not use drugs.      Objective/Observations  Physical Exam: Gen: NAD, resting comfortably Pulm: Normal work of breathing Neuro: Grossly normal, moves all extremities Psych: Normal affect and thought content  Virtual Visit via Video   I connected with Melissa [name] on 05/31/18 at 11:00 AM EDT by a video enabled telemedicine application and verified that I am speaking with the correct person using two identifiers. I discussed the limitations of evaluation and management by telemedicine and the availability of in person appointments. The patient expressed understanding and  agreed to proceed.   Patient location: Home Provider location: [place] [employer] Persons participating in the virtual visit: Myself and Patient     Katina Degree. [name], MD 05/31/2018 10:48 AM

## 2018-06-07 ENCOUNTER — Telehealth: Payer: Self-pay | Admitting: Family Medicine

## 2018-06-07 ENCOUNTER — Other Ambulatory Visit: Payer: Self-pay

## 2018-06-07 MED ORDER — ALBUTEROL SULFATE HFA 108 (90 BASE) MCG/ACT IN AERS: 2.0000 | INHALATION_SPRAY | Freq: Four times a day (QID) | RESPIRATORY_TRACT | 0 refills | Status: DC | PRN

## 2018-06-07 NOTE — Telephone Encounter (Signed)
Copied from CRM [id number]. Topic: Quick Communication - Rx Refill/Question >> Jun 07, 2018  3:32 PM Wyonia Hough E wrote: Medication: albuterol (PROVENTIL) (2.5 MG/3ML) 0.083% nebulizer solution  Has the patient contacted their pharmacy?   Preferred Pharmacy (with phone number or street name[facility] [phone] [facility], [place] - [address] [phone] (Phone) [phone] (Fax)    Agent: Please be advised that RX refills may take up to 3 business days. We ask that you follow-up with your pharmacy.

## 2018-06-07 NOTE — Telephone Encounter (Signed)
See note

## 2018-06-07 NOTE — Telephone Encounter (Signed)
Rx sent to pharmacy   

## 2018-06-11 ENCOUNTER — Telehealth: Payer: Self-pay | Admitting: Family Medicine

## 2018-06-11 ENCOUNTER — Other Ambulatory Visit: Payer: Self-pay

## 2018-06-11 MED ORDER — DOXYCYCLINE HYCLATE 100 MG PO TABS: 100.0000 mg | ORAL_TABLET | Freq: Two times a day (BID) | ORAL | 0 refills | Status: AC

## 2018-06-11 NOTE — Telephone Encounter (Signed)
Rx sent to pharmacy   

## 2018-06-11 NOTE — Telephone Encounter (Signed)
Copied from CRM [id number]. Topic: Quick Communication - See Telephone Encounter >> Jun 11, 2018  2:03 PM Jens Som A wrote: CRM for notification. See Telephone encounter for: 06/11/18.  Patient is calling because she is not feeling better from her last visit on 05/31/18. Patient is requesting an antibotic. Please advise Thank you. [phone] (H)  [facility] [phone], [place] - [address] [phone] (Phone) [phone] (Fax)

## 2018-06-11 NOTE — Telephone Encounter (Signed)
Please advise 

## 2018-06-11 NOTE — Telephone Encounter (Signed)
See note

## 2018-06-11 NOTE — Telephone Encounter (Signed)
Please send in doxycycline 100mg  bid x 7 days. Needs OV if still not improving.  Katina Degree. [name], MD 06/11/2018 4:04 PM

## 2018-07-02 ENCOUNTER — Other Ambulatory Visit: Payer: Self-pay | Admitting: Family Medicine

## 2018-07-17 ENCOUNTER — Telehealth: Payer: Self-pay | Admitting: Family Medicine

## 2018-07-17 NOTE — Telephone Encounter (Signed)
Copied from [place] [id number]. Topic: Quick Communication - Rx Refill/Question >> Jul 17, 2018  4:28 PM Rayann Heman wrote: Medication: meclizine (ANTIVERT) 25 MG tablet [[id number]]    Has the patient contacted their pharmacy?no Preferred Pharmacy (with phone number or street name):[id number] [address] [place], [place] [phone] (Phone) [phone] (Fax)    Agent: Please be advised that RX refills may take up to 3 business days. We ask that you follow-up with your pharmacy.

## 2018-07-17 NOTE — Telephone Encounter (Signed)
See request °

## 2018-07-18 ENCOUNTER — Other Ambulatory Visit: Payer: Self-pay

## 2018-07-18 MED ORDER — MECLIZINE HCL 25 MG PO TABS: 25.0000 mg | ORAL_TABLET | Freq: Two times a day (BID) | ORAL | 0 refills | Status: DC | PRN

## 2018-07-18 NOTE — Telephone Encounter (Signed)
Rx sent to pharmacy   

## 2018-08-09 ENCOUNTER — Other Ambulatory Visit: Payer: Self-pay | Admitting: Family Medicine

## 2018-08-10 NOTE — Telephone Encounter (Signed)
Last OV 05/31/18 Last refill 07/18/18 #30/0 Next OV not scheduled

## 2018-09-26 ENCOUNTER — Ambulatory Visit: Payer: Self-pay

## 2018-09-26 NOTE — Telephone Encounter (Signed)
Pt. Called to request an appt.  C/o intermittent sharp pain in left upper chest area, that started about 3-4 weeks ago.  Reported "the pain sort of takes my breath away."  Rated at 8/10, when it occurs.  Reported the pain was occurring about 2 times/week, and lasted about 2-3 min. per episode.  Stated the pain has increased in frequency, to about 4 times/ week, and lasts 5-6 min. per episode.  Denied any radiation of the pain.  denied having pain at this time.  Reported she was started on Malaria medication in May, and had an episode of chest pain when she started the new medication, and that her chest pain feels the same, as that episode.  Denied nausea or sweating associated with the pain.  Stated she feels somewhat short of breath until she works through the episode.  C/o continued cough that is dry at times and productive with clear to yellow brown mucus at other times.  Stated she thinks she has had a slight fever, due to chills; has not taken temp.  Reported the cough doesn't seem to get any better.    Attempted to call Flow Coordinator; unable to ring through to the office after several attempts.  Advised pt. Will send Triage note through to office, and a Scheduler should call her back to schedule an appt.  Advised to seek Emergency care if her symptoms worsen.  Pt. verb. Understanding.    Reason for Disposition . [1] Chest pain lasts > 5 minutes AND [2] occurred in past 3 days (72 hours)    Reported chest pain in left upper chest x 3-4 weeks, that has increased in frequency and duration.  Denied chest pain at present time.  Last episode was yesterday afternoon.  Reported pain is sharp, and lasts about 5-6 min, and takes her breath away.  Reported has had cough that has not improved.  Feels she has a slight fever.  Answer Assessment - Initial Assessment Questions 1. LOCATION: "Where does it hurt?"       "Left upper quadrant of chest"; described like a piercing pain right into my heart  2.  RADIATION: "Does the pain go anywhere else?" (e.g., into neck, jaw, arms, back)     Denied radiation  3. ONSET: "When did the chest pain begin?" (Minutes, hours or days)      About 3-4 weeks ago  4. PATTERN "Does the pain come and go, or has it been constant since it started?"  "Does it get worse with exertion?"      Comes and goes 5. DURATION: "How long does it last" (e.g., seconds, minutes, hours)    Was lasting 2-3 min. Duration; now is lasting 5-6 min. In duration 6. SEVERITY: "How bad is the pain?"  (e.g., Scale 1-10; mild, moderate, or severe)    - MILD (1-3): doesn't interfere with normal activities     - MODERATE (4-7): interferes with normal activities or awakens from sleep    - SEVERE (8-10): excruciating pain, unable to do any normal activities       8/10 7. CARDIAC RISK FACTORS: "Do you have any history of heart problems or risk factors for heart disease?" (e.g., angina, prior heart attack; diabetes, high blood pressure, high cholesterol, smoker, or strong family history of heart disease)    Denied DM, HTN, High cholesterol, or obesity; non smoker. Mother with  Heart disease; Grandfather with heart attack.   8. PULMONARY RISK FACTORS: "Do you have any history of lung  disease?"  (e.g., blood clots in lung, asthma, emphysema, birth control pills)     Hx of asthma; not on BCP 9. CAUSE: "What do you think is causing the chest pain?"     Unknown; questioned if it could be a side effect of the medication 10. OTHER SYMPTOMS: "Do you have any other symptoms?" (e.g., dizziness, nausea, vomiting, sweating, fever, difficulty breathing, cough)       "It sort of takes my breath away"; stated some shortness of breath after the episode. Reported pain occurs for 5-6 min. ; increased in frequency to 2x/day about 4 days / week; c/o increased cough; coughing up yellow brown phlegm; c/o slight fever/ intermittent chills.  11. PREGNANCY: "Is there any chance you are pregnant?" "When was your last  menstrual period?"       On Menstrual cycle now  Protocols used: CHEST PAIN-A-AH

## 2018-09-26 NOTE — Telephone Encounter (Signed)
Patient having symptoms of chest pain and soreness. Spoke with patient informed patient to go to the ER or Urgent Care to get checked out to make sure she is not having a heart attack.

## 2018-09-26 NOTE — Telephone Encounter (Signed)
Please advise 

## 2018-09-27 ENCOUNTER — Telehealth: Payer: Self-pay | Admitting: Family Medicine

## 2018-09-27 ENCOUNTER — Telehealth: Payer: Self-pay

## 2018-09-27 NOTE — Telephone Encounter (Signed)
Patient notified voices understanding 

## 2018-09-27 NOTE — Telephone Encounter (Signed)
Copied from [place] [id number]. Topic: Appointment Scheduling - Scheduling Inquiry for Clinic >> Sep 27, 2018  7:54 AM Rayann Heman wrote: Reason for CRM: pt called and stated that she would like to schedule an appointment for chest pain. Please see triage note. Pt did not want to go to ER or urgent care.

## 2018-09-27 NOTE — Telephone Encounter (Signed)
Informed patient to go to ER.

## 2018-09-27 NOTE — Telephone Encounter (Signed)
Agree with ED dispo for chest pain and fever.   Algis Greenhouse. Jerline Pain, MD 09/27/2018 2:16 PM

## 2018-09-27 NOTE — Telephone Encounter (Signed)
See note  Copied from [place] [id number]. Topic: General - Other >> Sep 27, 2018  3:29 PM Antonieta Iba C wrote: Reason for CRM: pt called in requesting to have her results sent to her Cardiologist.  Fax: [phone]

## 2018-09-27 NOTE — Telephone Encounter (Signed)
Please advise 

## 2018-09-28 NOTE — Telephone Encounter (Signed)
Patient wants medical records sent to [facility]

## 2018-09-28 NOTE — Telephone Encounter (Signed)
Incorrect route.

## 2018-10-01 NOTE — Telephone Encounter (Signed)
Please send all records to [facility].

## 2018-10-11 ENCOUNTER — Other Ambulatory Visit: Payer: Self-pay | Admitting: Family Medicine

## 2018-10-11 MED ORDER — MECLIZINE HCL 25 MG PO TABS: 25.0000 mg | ORAL_TABLET | Freq: Two times a day (BID) | ORAL | 0 refills | Status: DC | PRN

## 2018-10-16 ENCOUNTER — Other Ambulatory Visit: Payer: Self-pay | Admitting: Family Medicine

## 2018-11-09 ENCOUNTER — Ambulatory Visit (INDEPENDENT_AMBULATORY_CARE_PROVIDER_SITE_OTHER): Payer: BLUE CROSS/BLUE SHIELD | Admitting: Family Medicine

## 2018-11-09 ENCOUNTER — Encounter: Payer: Self-pay | Admitting: Family Medicine

## 2018-11-09 ENCOUNTER — Other Ambulatory Visit: Payer: Self-pay

## 2018-11-09 ENCOUNTER — Inpatient Hospital Stay (HOSPITAL_COMMUNITY): Admit: 2018-11-09 | Payer: BLUE CROSS/BLUE SHIELD

## 2018-11-09 VITALS — BP 131/94 | HR 84 | Temp 98.0°F | Ht 67.0 in | Wt 175.0 lb

## 2018-11-09 DIAGNOSIS — K589 Irritable bowel syndrome without diarrhea: Secondary | ICD-10-CM

## 2018-11-09 DIAGNOSIS — R05 Cough: Secondary | ICD-10-CM

## 2018-11-09 DIAGNOSIS — Z13220 Encounter for screening for lipoid disorders: Secondary | ICD-10-CM

## 2018-11-09 DIAGNOSIS — Z0001 Encounter for general adult medical examination with abnormal findings: Secondary | ICD-10-CM

## 2018-11-09 DIAGNOSIS — J4520 Mild intermittent asthma, uncomplicated: Secondary | ICD-10-CM | POA: Diagnosis not present

## 2018-11-09 DIAGNOSIS — R059 Cough, unspecified: Secondary | ICD-10-CM

## 2018-11-09 DIAGNOSIS — Z124 Encounter for screening for malignant neoplasm of cervix: Secondary | ICD-10-CM | POA: Diagnosis not present

## 2018-11-09 DIAGNOSIS — R5383 Other fatigue: Secondary | ICD-10-CM

## 2018-11-09 DIAGNOSIS — Z6827 Body mass index (BMI) 27.0-27.9, adult: Secondary | ICD-10-CM

## 2018-11-09 DIAGNOSIS — Z23 Encounter for immunization: Secondary | ICD-10-CM

## 2018-11-09 DIAGNOSIS — F321 Major depressive disorder, single episode, moderate: Secondary | ICD-10-CM | POA: Diagnosis not present

## 2018-11-09 DIAGNOSIS — M797 Fibromyalgia: Secondary | ICD-10-CM

## 2018-11-09 DIAGNOSIS — Z113 Encounter for screening for infections with a predominantly sexual mode of transmission: Secondary | ICD-10-CM

## 2018-11-09 DIAGNOSIS — E663 Overweight: Secondary | ICD-10-CM

## 2018-11-09 MED ORDER — ESCITALOPRAM OXALATE 10 MG PO TABS: 10.0000 mg | ORAL_TABLET | Freq: Every day | ORAL | 3 refills | Status: DC

## 2018-11-09 MED ORDER — PANTOPRAZOLE SODIUM 40 MG PO TBEC: 40.0000 mg | DELAYED_RELEASE_TABLET | Freq: Every day | ORAL | 3 refills | Status: DC

## 2018-11-09 MED ORDER — DICYCLOMINE HCL 20 MG PO TABS: 20.0000 mg | ORAL_TABLET | Freq: Three times a day (TID) | ORAL | 3 refills | Status: DC

## 2018-11-09 MED ORDER — MECLIZINE HCL 25 MG PO TABS: 25.0000 mg | ORAL_TABLET | Freq: Two times a day (BID) | ORAL | 3 refills | Status: DC | PRN

## 2018-11-09 MED ORDER — AMITRIPTYLINE HCL 10 MG PO TABS: 10.0000 mg | ORAL_TABLET | Freq: Every day | ORAL | 5 refills | Status: DC

## 2018-11-09 MED ORDER — BACLOFEN 10 MG PO TABS: 5.0000 mg | ORAL_TABLET | Freq: Three times a day (TID) | ORAL | 0 refills | Status: DC

## 2018-11-09 NOTE — Assessment & Plan Note (Signed)
Chronic problem.  Worsening.  Will place referral to rheumatology.  Start baclofen 5 mg 3 times daily.  Also start amitriptyline 10 mg nightly.  Hopefully this will help some with her worsening anxiety/depression symptoms as well labs some of her insomnia type symptoms.

## 2018-11-09 NOTE — Assessment & Plan Note (Signed)
Continue Bentyl 20 mg 3 times daily before meals and at bedtime.  As noted above, will start amitriptyline which hopefully will help some with IBS as well.

## 2018-11-09 NOTE — Progress Notes (Signed)
Chief Complaint:  Melissa [name] is a 38 y.o. female who presents today for her annual comprehensive physical exam.    Assessment/Plan:  Fibromyalgia Chronic problem.  Worsening.  Will place referral to rheumatology.  Start baclofen 5 mg 3 times daily.  Also start amitriptyline 10 mg nightly.  Hopefully this will help some with her worsening anxiety/depression symptoms as well labs some of her insomnia type symptoms.  Depression, major, single episode, moderate (HCC) Discussed increasing dose of Lexapro.  Patient declined.  We will continue 10 mg daily.  She will also continue to follow-up with therapist.  IRRITABLE BOWEL SYNDROME Continue Bentyl 20 mg 3 times daily before meals and at bedtime.  As noted above, will start amitriptyline which hopefully will help some with IBS as well.  Asthma Chronic cough could be due to underlying asthma.  Given 28-monthhistory, we will check a chest x-ray today.  Depending on results, would consider starting inhaled corticosteroid.  May also need referral to pulmonology if continues to have issues.  Cough Checks x-ray today.  Vitamin D deficiency Check vitamin D level  B12 deficiency Check B12 level  Fatigue Likely multifactorial in setting of sleep difficulty and fibromyalgia.  We will check CBC, C met, TSH, B12, vitamin D.   Body mass index is 27.41 kg/m. / Overweight BMI Metric Follow Up - 11/09/18 1624      BMI Metric Follow Up-Please document annually   BMI Metric Follow Up  Education provided       Preventative Healthcare: Pap test performed today.  Check CBC, C met, TSH, lipid panel, vitamin D, and B12 levels.  Also screen for STDs today.  Flu vaccine given today.  Due for mammogram next year.  Patient Counseling(The following topics were reviewed and/or handout was given):  -Nutrition: Stressed importance of moderation in sodium/caffeine intake, saturated fat and cholesterol, caloric balance, sufficient intake of fresh fruits,  vegetables, and fiber.  -Stressed the importance of regular exercise.   -Substance Abuse: Discussed cessation/primary prevention of tobacco, alcohol, or other drug use; driving or other dangerous activities under the influence; availability of treatment for abuse.   -Injury prevention: Discussed safety belts, safety helmets, smoke detector, smoking near bedding or upholstery.   -Sexuality: Discussed sexually transmitted diseases, partner selection, use of condoms, avoidance of unintended pregnancy and contraceptive alternatives.   -Dental health: Discussed importance of regular tooth brushing, flossing, and dental visits.  -Health maintenance and immunizations reviewed. Please refer to Health maintenance section.  Return to care in 1 year for next preventative visit.     Subjective:  HPI:  She has several issues she would like to have addressed outlined below:  # Cough Started about 6 months ago.  Was diagnosed with URI and possible asthma exacerbation at that time.  Since then she has had persistent cough.  Sometimes productive of yellowish-brown sputum.  No shortness of breath.  No wheeze.  No other treatments tried.  No fever or chills.  # Fatigue Patient is also had worsening fatigue for the past several months.  She has had some more difficulty sleeping as well.  She will sometimes go several days without sleeping.  She has trouble falling asleep and staying asleep.  She has been on medications in the past including trazodone and hydroxyzine which were not effective.  She is also felt more sadness.  She is currently seeing a therapist to help with her anxiety and depression.  She feels like the Lexapro is working well for her.  She does not want to change his medication.  She has felt some more anxiety as well.  Her other, chronic medical conditions are outlined below:   # Depression / Anxiety - On lexapro 82m daily and tolerating well -Follows with therapist - ROS: No reported SI or  HI  # Fibromyalgia -Referred to rheumatology  # IBS  / GERD -Follows with GI - On Bentyl 20 mg 3 times daily.  Tolerating well. - On Protonix 40 mg daily.  Lifestyle Diet: Tries a healthy balanced diet plenty of fruits vegetables. Exercise: Limited.  Tries to be as active as possible.  Depression screen PHQ 2/9 11/09/2018  Decreased Interest 0  Down, Depressed, Hopeless 2  PHQ - 2 Score 2  Altered sleeping 3  Tired, decreased energy 3  Change in appetite 0  Feeling bad or failure about yourself  0  Trouble concentrating 0  Moving slowly or fidgety/restless 3  Suicidal thoughts 0  PHQ-9 Score 11    Health Maintenance Due  Topic Date Due   HIV Screening  01/17/1994   PAP SMEAR-Modifier  04/01/2003   INFLUENZA VACCINE  09/01/2018     ROS: Per HPI, otherwise a complete review of systems was negative.   PMH:  The following were reviewed and entered/updated in epic: Past Medical History:  Diagnosis Date   Asthma    IBS (irritable bowel syndrome)    Migraine    Patient Active Problem List   Diagnosis Date Noted   Depression, major, single episode, moderate ([place] 09/22/2017   Fibromyalgia 09/22/2017   RHINITIS, ALLERGIC 03/30/2006   Asthma 03/30/2006   IRRITABLE BOWEL SYNDROME 03/30/2006   Past [facility] History:  [facility]    Family History  Problem Relation Age of Onset   Arthritis Mother    Diabetes Mother    High Cholesterol Mother    Cancer Father    High blood pressure Father    High Cholesterol Sister    High blood pressure Sister    Kidney disease Maternal Grandmother    Cancer Maternal Grandfather    Heart attack Maternal Grandfather    High Cholesterol Paternal Grandmother    High blood pressure Paternal Grandmother    High blood pressure Paternal Grandfather     Medications- reviewed and updated Current Outpatient Medications  Medication Sig Dispense Refill   Ascorbic Acid (VITAMIN C)  1000 MG tablet Take 1,000 mg by mouth daily.     cholecalciferol (VITAMIN D) 1000 units tablet Take 1,000 Units by mouth daily.     COD LIVER OIL PO Take by mouth.     dicyclomine (BENTYL) 20 MG tablet Take 1 tablet (20 mg total) by mouth 4 (four) times daily -  before meals and at bedtime. 270 tablet 3   escitalopram (LEXAPRO) 10 MG tablet Take 1 tablet (10 mg total) by mouth daily. 90 tablet 3   meclizine (ANTIVERT) 25 MG tablet Take 1 tablet (25 mg total) by mouth 2 (two) times daily as needed for dizziness. 30 tablet 3   Multiple Vitamin (MULTIVITAMIN) tablet Take 1 tablet by mouth daily.     Omega-3 Fatty Acids (FISH OIL) 1000 MG CAPS Take by mouth.     pantoprazole (PROTONIX) 40 MG tablet Take 1 tablet (40 mg total) by mouth daily. 90 tablet 3   pyridoxine (B-6) 250 MG tablet Take 250 mg by mouth daily.     Riboflavin (B-2-400 PO) Take by mouth.  VENTOLIN HFA 108 (90 Base) MCG/ACT inhaler TAKE 2 PUFFS BY MOUTH EVERY 6 HOURS AS NEEDED FOR WHEEZE OR SHORTNESS OF BREATH 18 g 3   vitamin B-12 (CYANOCOBALAMIN) 1000 MCG tablet Take 1,000 mcg by mouth daily.     amitriptyline (ELAVIL) 10 MG tablet Take 1 tablet (10 mg total) by mouth at bedtime. 30 tablet 5   baclofen (LIORESAL) 10 MG tablet Take 0.5 tablets (5 mg total) by mouth 3 (three) times daily. 30 each 0   No current facility-administered medications for this visit.     Allergies-reviewed and updated Allergies  Allergen Reactions   Asa [Aspirin]    Latex    Penicillins    Vitamin E Hives    Social History   Socioeconomic History   Marital status: Single    Spouse name: Not on file   Number of children: 0   Years of education: Not on file   Highest education level: Bachelor's degree (e.g., BA, AB, BS)  Occupational History   Occupation: Armed forces operational officer: [employer] resource strain: Not on file   Food insecurity    Worry: Not on file    Inability: Not on file     Transportation needs    Medical: Not on file    Non-medical: Not on file  Tobacco Use   Smoking status: Former Smoker    Types: Cigars    Quit date: 01/01/2018    Years since quitting: 0.8   Smokeless tobacco: Never Used   Tobacco comment: Black and Milds  Substance and Sexual Activity   Alcohol use: Yes   Drug use: No   Sexual activity: Not Currently  Lifestyle   Physical activity    Days per week: Not on file    Minutes per session: Not on file   Stress: Not on file  Relationships   Social connections    Talks on phone: Not on file    Gets together: Not on file    Attends religious service: Not on file    Active member of club or organization: Not on file    Attends meetings of clubs or organizations: Not on file    Relationship status: Not on file  Other Topics Concern   Not on file  Social History Narrative   Patient is right-handed. She lives in a single level home. Her mother lives with her. She drinks 1 cup of coffee a day. She does not exercise.        Objective:  Physical Exam: BP (!) 131/94    Pulse 84    Temp 98 F (36.7 C)    Ht _0  (1.702 m)    Wt 175 lb (79.4 kg)    SpO2 100%    BMI 27.41 kg/m   Body mass index is 27.41 kg/m. Wt Readings from Last 3 Encounters:  11/09/18 175 lb (79.4 kg)  11/24/17 177 lb 8 oz (80.5 kg)  10/26/17 175 lb (79.4 kg)   Gen: NAD, resting comfortably HEENT: TMs normal bilaterally. OP clear. No thyromegaly noted.  CV: RRR with no murmurs appreciated Pulm: NWOB, CTAB with no crackles, wheezes, or rhonchi GI: Normal bowel sounds present. Soft, Nontender, Nondistended. GU: Normal internal/external female genitalia. MSK: no edema, cyanosis, or clubbing noted Skin: warm, dry Neuro: CN2-12 grossly intact. Strength 5/5 in upper and lower extremities. Reflexes symmetric and intact bilaterally.  Psych: Normal affect and thought content     Melissa [name] M. Jerline Pain,  MD 11/09/2018 4:25 PM

## 2018-11-09 NOTE — Patient Instructions (Addendum)
It was very nice to see you today!  Please check  Take take the amitriptyline at night.  Please also take the baclofen during the day.  I hope that these medications will help with your fibromyalgia and your sleep issues.  They may also help with your irritable bowel syndrome.  We will check blood work today.  We will also check a chest x-ray and a Pap test.  I will check to see if there are any other rheumatologist to conceive in the area.  Come back to see me in 1 year for your next physical, or sooner if needed.  Dr Jerline Pain  Please try these tips to maintain a healthy lifestyle:   Eat at least 3 REAL meals and 1-2 snacks per day.  Aim for no more than 5 hours between eating.  If you eat breakfast, please do so within one hour of getting up.    Obtain twice as many fruits/vegetables as protein or carbohydrate foods for both lunch and dinner. (Half of each meal should be fruits/vegetables, one quarter protein, and one quarter starchy carbs)   Cut down on sweet beverages. This includes juice, soda, and sweet tea.    Exercise at least 150 minutes every week.    Preventive Care 64-36 Years Old, Female Preventive care refers to visits with your health care provider and lifestyle choices that can promote health and wellness. This includes:  A yearly physical exam. This may also be called an annual well check.  Regular dental visits and eye exams.  Immunizations.  Screening for certain conditions.  Healthy lifestyle choices, such as eating a healthy diet, getting regular exercise, not using drugs or products that contain nicotine and tobacco, and limiting alcohol use. What can I expect for my preventive care visit? Physical exam Your health care provider will check your:  Height and weight. This may be used to calculate body mass index (BMI), which tells if you are at a healthy weight.  Heart rate and blood pressure.  Skin for abnormal spots. Counseling Your health care  provider may ask you questions about your:  Alcohol, tobacco, and drug use.  Emotional well-being.  Home and relationship well-being.  Sexual activity.  Eating habits.  Work and work Statistician.  Method of birth control.  Menstrual cycle.  Pregnancy history. What immunizations do I need?  Influenza (flu) vaccine  This is recommended every year. Tetanus, diphtheria, and pertussis (Tdap) vaccine  You may need a Td booster every 10 years. Varicella (chickenpox) vaccine  You may need this if you have not been vaccinated. Human papillomavirus (HPV) vaccine  If recommended by your health care provider, you may need three doses over 6 months. Measles, mumps, and rubella (MMR) vaccine  You may need at least one dose of MMR. You may also need a second dose. Meningococcal conjugate (MenACWY) vaccine  One dose is recommended if you are age 89-21 years and a first-year college student living in a residence hall, or if you have one of several medical conditions. You may also need additional booster doses. Pneumococcal conjugate (PCV13) vaccine  You may need this if you have certain conditions and were not previously vaccinated. Pneumococcal polysaccharide (PPSV23) vaccine  You may need one or two doses if you smoke cigarettes or if you have certain conditions. Hepatitis A vaccine  You may need this if you have certain conditions or if you travel or work in places where you may be exposed to hepatitis A. Hepatitis B vaccine  You may need this if you have certain conditions or if you travel or work in places where you may be exposed to hepatitis B. Haemophilus influenzae type b (Hib) vaccine  You may need this if you have certain conditions. You may receive vaccines as individual doses or as more than one vaccine together in one shot (combination vaccines). Talk with your health care provider about the risks and benefits of combination vaccines. What tests do I need?  Blood  tests  Lipid and cholesterol levels. These may be checked every 5 years starting at age 47.  Hepatitis C test.  Hepatitis B test. Screening  Diabetes screening. This is done by checking your blood sugar (glucose) after you have not eaten for a while (fasting).  Sexually transmitted disease (STD) testing.  BRCA-related cancer screening. This may be done if you have a family history of breast, ovarian, tubal, or peritoneal cancers.  Pelvic exam and Pap test. This may be done every 3 years starting at age 72. Starting at age 55, this may be done every 5 years if you have a Pap test in combination with an HPV test. Talk with your health care provider about your test results, treatment options, and if necessary, the need for more tests. Follow these instructions at home: Eating and drinking   Eat a diet that includes fresh fruits and vegetables, whole grains, lean protein, and low-fat dairy.  Take vitamin and mineral supplements as recommended by your health care provider.  Do not drink alcohol if: ? Your health care provider tells you not to drink. ? You are pregnant, may be pregnant, or are planning to become pregnant.  If you drink alcohol: ? Limit how much you have to 0-1 drink a day. ? Be aware of how much alcohol is in your drink. In the [place] one drink equals one 12 oz bottle of beer (355 mL), one 5 oz glass of wine (148 mL), or one 1 oz glass of hard liquor (44 mL). Lifestyle  Take daily care of your teeth and gums.  Stay active. Exercise for at least 30 minutes on 5 or more days each week.  Do not use any products that contain nicotine or tobacco, such as cigarettes, e-cigarettes, and chewing tobacco. If you need help quitting, ask your health care provider.  If you are sexually active, practice safe sex. Use a condom or other form of birth control (contraception) in order to prevent pregnancy and STIs (sexually transmitted infections). If you plan to become pregnant, see  your health care provider for a preconception visit. What's next?  Visit your health care provider once a year for a well check visit.  Ask your health care provider how often you should have your eyes and teeth checked.  Stay up to date on all vaccines. This information is not intended to replace advice given to you by your health care provider. Make sure you discuss any questions you have with your health care provider. Document Released: 03/15/2001 Document Revised: 09/28/2017 Document Reviewed: 09/28/2017 Elsevier Patient Education  2020 [employer].

## 2018-11-09 NOTE — Assessment & Plan Note (Signed)
Chronic cough could be due to underlying asthma.  Given 4-month history, we will check a chest x-ray today.  Depending on results, would consider starting inhaled corticosteroid.  May also need referral to pulmonology if continues to have issues.

## 2018-11-09 NOTE — Assessment & Plan Note (Signed)
Discussed increasing dose of Lexapro.  Patient declined.  We will continue 10 mg daily.  She will also continue to follow-up with therapist.

## 2018-11-12 ENCOUNTER — Encounter: Payer: Self-pay | Admitting: Family Medicine

## 2018-11-12 DIAGNOSIS — R739 Hyperglycemia, unspecified: Secondary | ICD-10-CM | POA: Insufficient documentation

## 2018-11-12 DIAGNOSIS — E785 Hyperlipidemia, unspecified: Secondary | ICD-10-CM | POA: Insufficient documentation

## 2018-11-12 LAB — COMPREHENSIVE METABOLIC PANEL
AG Ratio: 1.6 (calc) (ref 1.0–2.5)
ALT: 37 U/L — ABNORMAL HIGH (ref 6–29)
AST: 23 U/L (ref 10–30)
Albumin: 4.6 g/dL (ref 3.6–5.1)
Alkaline phosphatase (APISO): 42 U/L (ref 31–125)
BUN: 7 mg/dL (ref 7–25)
CO2: 22 mmol/L (ref 20–32)
Calcium: 9.6 mg/dL (ref 8.6–10.2)
Chloride: 104 mmol/L (ref 98–110)
Creat: 0.69 mg/dL (ref 0.50–1.10)
Globulin: 2.8 g/dL (calc) (ref 1.9–3.7)
Glucose, Bld: 117 mg/dL — ABNORMAL HIGH (ref 65–99)
Potassium: 4.3 mmol/L (ref 3.5–5.3)
Sodium: 137 mmol/L (ref 135–146)
Total Bilirubin: 0.5 mg/dL (ref 0.2–1.2)
Total Protein: 7.4 g/dL (ref 6.1–8.1)

## 2018-11-12 LAB — VITAMIN D 25 HYDROXY (VIT D DEFICIENCY, FRACTURES): Vit D, 25-Hydroxy: 49 ng/mL (ref 30–100)

## 2018-11-12 LAB — LIPID PANEL
Cholesterol: 257 mg/dL — ABNORMAL HIGH (ref <200)
HDL: 53 mg/dL (ref >=50)
LDL Cholesterol (Calc): 170 mg/dL (calc) — ABNORMAL HIGH
Non-HDL Cholesterol (Calc): 204 mg/dL (calc) — ABNORMAL HIGH (ref <130)
Total CHOL/HDL Ratio: 4.8 (calc) (ref <5.0)
Triglycerides: 181 mg/dL — ABNORMAL HIGH (ref <150)

## 2018-11-12 LAB — TSH: TSH: 0.64 mIU/L

## 2018-11-12 LAB — CBC
HCT: 37.4 % (ref 35.0–45.0)
Hemoglobin: 12.7 g/dL (ref 11.7–15.5)
MCH: 28 pg (ref 27.0–33.0)
MCHC: 34 g/dL (ref 32.0–36.0)
MCV: 82.4 fL (ref 80.0–100.0)
MPV: 10.4 fL (ref 7.5–12.5)
Platelets: 257 10*3/uL (ref 140–400)
RBC: 4.54 10*6/uL (ref 3.80–5.10)
RDW: 13.4 % (ref 11.0–15.0)
WBC: 4.6 10*3/uL (ref 3.8–10.8)

## 2018-11-12 LAB — VITAMIN B12: Vitamin B-12: 1769 pg/mL — ABNORMAL HIGH (ref 200–1100)

## 2018-11-12 LAB — RPR: RPR Ser Ql: NONREACTIVE

## 2018-11-12 LAB — HIV ANTIBODY (ROUTINE TESTING W REFLEX): HIV 1&2 Ab, 4th Generation: NONREACTIVE

## 2018-11-12 NOTE — Progress Notes (Signed)
Please inform patient of the following:  Her cholesterol and blood sugar levels were slightly elevated but everything else is NORMAL. She does not need to start medications for this, but would like for her to continue to work on diet and exercise and we can recheck in a year or so.  Looks like she did not get her xray while she was here - we can continue to watch for a few more weeks, but if she is still having a lingering cough I would like for her to come back to have it done.  Algis Greenhouse. Jerline Pain, MD 11/12/2018 1:07 PM

## 2018-11-16 ENCOUNTER — Telehealth: Payer: Self-pay

## 2018-11-16 NOTE — Telephone Encounter (Signed)
Copied from [place] [id number]. Topic: General - Other >> Nov 16, 2018  1:28 PM Carolyn Stare wrote: Dr Estanislado Pandy office called  pt insurance is not in network , her insurance is Conservation officer, [employer]  which is [facility]

## 2018-11-19 NOTE — Telephone Encounter (Signed)
See note

## 2018-11-23 LAB — CYTOLOGY - PAP
Chlamydia: NEGATIVE
Comment: NEGATIVE
Comment: NEGATIVE
Comment: NEGATIVE
Comment: NORMAL
Diagnosis: NEGATIVE
High risk HPV: NEGATIVE
Neisseria Gonorrhea: NEGATIVE
Trichomonas: NEGATIVE

## 2018-12-01 ENCOUNTER — Other Ambulatory Visit: Payer: Self-pay | Admitting: Family Medicine

## 2018-12-04 ENCOUNTER — Other Ambulatory Visit: Payer: Self-pay | Admitting: Family Medicine

## 2018-12-05 ENCOUNTER — Telehealth: Payer: Self-pay | Admitting: Family Medicine

## 2018-12-05 NOTE — Telephone Encounter (Signed)
Lmom for her to call me back regarding her insurance.

## 2018-12-13 ENCOUNTER — Ambulatory Visit: Payer: BLUE CROSS/BLUE SHIELD | Admitting: Rheumatology

## 2019-01-03 ENCOUNTER — Other Ambulatory Visit: Payer: Self-pay

## 2019-01-03 ENCOUNTER — Encounter: Payer: Self-pay | Admitting: Family Medicine

## 2019-01-03 ENCOUNTER — Ambulatory Visit (INDEPENDENT_AMBULATORY_CARE_PROVIDER_SITE_OTHER): Payer: BLUE CROSS/BLUE SHIELD | Admitting: Family Medicine

## 2019-01-03 DIAGNOSIS — H109 Unspecified conjunctivitis: Secondary | ICD-10-CM

## 2019-01-03 DIAGNOSIS — J4520 Mild intermittent asthma, uncomplicated: Secondary | ICD-10-CM

## 2019-01-03 DIAGNOSIS — M797 Fibromyalgia: Secondary | ICD-10-CM

## 2019-01-03 DIAGNOSIS — Z20822 Contact with and (suspected) exposure to covid-19: Secondary | ICD-10-CM

## 2019-01-03 DIAGNOSIS — R059 Cough, unspecified: Secondary | ICD-10-CM

## 2019-01-03 DIAGNOSIS — R05 Cough: Secondary | ICD-10-CM

## 2019-01-03 DIAGNOSIS — F321 Major depressive disorder, single episode, moderate: Secondary | ICD-10-CM | POA: Diagnosis not present

## 2019-01-03 MED ORDER — POLYMYXIN B-TRIMETHOPRIM 10000-0.1 UNIT/ML-% OP SOLN: 1.0000 [drp] | OPHTHALMIC | 0 refills | Status: DC

## 2019-01-03 MED ORDER — ALBUTEROL SULFATE HFA 108 (90 BASE) MCG/ACT IN AERS: INHALATION_SPRAY | RESPIRATORY_TRACT | 3 refills | Status: DC

## 2019-01-03 MED ORDER — AZITHROMYCIN 250 MG PO TABS: ORAL_TABLET | ORAL | 0 refills | Status: DC

## 2019-01-03 MED ORDER — BACLOFEN 10 MG PO TABS: 5.0000 mg | ORAL_TABLET | Freq: Three times a day (TID) | ORAL | 3 refills | Status: DC

## 2019-01-03 MED ORDER — MECLIZINE HCL 25 MG PO TABS: 25.0000 mg | ORAL_TABLET | Freq: Two times a day (BID) | ORAL | 3 refills | Status: DC | PRN

## 2019-01-03 MED ORDER — ALBUTEROL SULFATE (2.5 MG/3ML) 0.083% IN NEBU: 2.5000 mg | INHALATION_SOLUTION | Freq: Four times a day (QID) | RESPIRATORY_TRACT | 1 refills | Status: AC | PRN

## 2019-01-03 MED ORDER — BUDESONIDE-FORMOTEROL FUMARATE 80-4.5 MCG/ACT IN AERO: 2.0000 | INHALATION_SPRAY | Freq: Two times a day (BID) | RESPIRATORY_TRACT | 3 refills | Status: DC

## 2019-01-03 MED ORDER — OLOPATADINE HCL 0.2 % OP SOLN: OPHTHALMIC | 0 refills | Status: DC

## 2019-01-03 NOTE — Assessment & Plan Note (Signed)
No significant change since last visit.  Refill baclofen.  Will increase dose of amitriptyline as tolerated.

## 2019-01-03 NOTE — Progress Notes (Signed)
   Chief Complaint:  Melissa [name] is a 38 y.o. female who presents today for a virtual office visit with a chief complaint of chronic cough.   Assessment/Plan:  Cough Likely due to asthma, but cannot rule out other causes. Was supposed to get CXR today, however due to additional URI symptoms, she will not be able to.  Will start symbicort. Will get covid test, and if negative, she will return to office for CXR   Conjunctivitis  Likely allergic. Start Pataday drops.  Will send in prescription for Polytrim drops if not improving in the next couple of days.  Asthma See above.  Will be coming back for chest x-ray.  Also start Symbicort.  May need referral to pulmonology.  Fibromyalgia No significant change since last visit.  Refill baclofen.  Will increase dose of amitriptyline as tolerated.  Depression, major, single episode, moderate (HCC) Continue Lexapro 10 mg daily.    Subjective:  HPI:  Chronic Cough On for several months.  Stable.  Albuterol helps.  Recently has been more productive of brown sputum.  No unintentional weight loss.  No fevers or chills.  No other treatments tried.  She has also had some bilateral eye irritation for the past several days.  Seems to be worsening.  Some eye drainage and rhinorrhea.  Eyes are very painful and itchy.  Feels like something is stuck in her eyes.  Occasional blurred vision.  Her stable, chronic medical conditions are outlined below:  # Depression / Anxiety - On lexapro 10mg  daily and tolerating well -Follows with therapist - ROS: No reported SI or HI  # Fibromyalgia -Referred to rheumatology  # IBS  / GERD -Follows with GI - On Bentyl 20 mg 3 times daily.  Tolerating well. - On Protonix 40 mg daily.  ROS: Per HPI  PMH: She reports that she quit smoking about a year ago. Her smoking use included cigars. She has never used smokeless tobacco. She reports current alcohol use. She reports that she does not use drugs.       Objective/Observations  Physical Exam: Gen: NAD, resting comfortably Pulm: Normal work of breathing Neuro: Grossly normal, moves all extremities Psych: Normal affect and thought content  Virtual Visit via Video   I connected with Vincente Poli on 01/03/19 at  8:40 AM EST by a video enabled telemedicine application and verified that I am speaking with the correct person using two identifiers. I discussed the limitations of evaluation and management by telemedicine and the availability of in person appointments. The patient expressed understanding and agreed to proceed.   Patient location: Home Provider location: [place] participating in the virtual visit: Myself and Patient     Algis Greenhouse. Jerline Pain, MD 01/03/2019 9:37 AM

## 2019-01-03 NOTE — Assessment & Plan Note (Signed)
See above.  Will be coming back for chest x-ray.  Also start Symbicort.  May need referral to pulmonology.

## 2019-01-03 NOTE — Assessment & Plan Note (Signed)
Continue Lexapro 10 mg daily

## 2019-01-05 LAB — NOVEL CORONAVIRUS, NAA: SARS-CoV-2, NAA: NOT DETECTED

## 2019-01-07 NOTE — Progress Notes (Signed)
Please inform patient of the following:  Covid test negative - would like for her to come in to get her CXR.  Melissa [name]. Jerline Pain, MD 01/07/2019 10:59 AM

## 2019-01-11 ENCOUNTER — Other Ambulatory Visit: Payer: Self-pay

## 2019-01-14 ENCOUNTER — Ambulatory Visit (INDEPENDENT_AMBULATORY_CARE_PROVIDER_SITE_OTHER): Payer: BLUE CROSS/BLUE SHIELD | Admitting: Family Medicine

## 2019-01-14 ENCOUNTER — Encounter: Payer: Self-pay | Admitting: Family Medicine

## 2019-01-14 ENCOUNTER — Other Ambulatory Visit: Payer: Self-pay

## 2019-01-14 ENCOUNTER — Ambulatory Visit (INDEPENDENT_AMBULATORY_CARE_PROVIDER_SITE_OTHER): Payer: BLUE CROSS/BLUE SHIELD

## 2019-01-14 VITALS — BP 131/89 | HR 100 | Temp 98.2°F | Ht 67.0 in | Wt 181.0 lb

## 2019-01-14 DIAGNOSIS — R059 Cough, unspecified: Secondary | ICD-10-CM

## 2019-01-14 DIAGNOSIS — F321 Major depressive disorder, single episode, moderate: Secondary | ICD-10-CM | POA: Diagnosis not present

## 2019-01-14 DIAGNOSIS — R05 Cough: Secondary | ICD-10-CM

## 2019-01-14 MED ORDER — ESCITALOPRAM OXALATE 20 MG PO TABS: 20.0000 mg | ORAL_TABLET | Freq: Every day | ORAL | 3 refills | Status: DC

## 2019-01-14 NOTE — Addendum Note (Signed)
Addended by: Vivi Barrack on: 01/14/2019 09:59 AM   Modules accepted: Orders

## 2019-01-14 NOTE — Progress Notes (Signed)
   Chief Complaint:  Melissa [name] is a 38 y.o. female who presents today with a chief complaint of cough.   Assessment/Plan:  Cough Improving with Symbicort.  Possibly related to her asthma.  Check chest x-ray.  If symptoms persist may need referral to pulmonology.  Depression, major, single episode, moderate (HCC) Stable.  Refilled Lexapro 20 mg daily.     Subjective:  HPI:  Cough  Chronic problem.  Going on for the past several months.  Was started on Symbicort a couple weeks ago.  Symptoms have improved slightly since then.  Has a hard time coughing up mucus.  Had a thoracocentesis in the past related to pneumonia and thinks that this could be contributing.  Depression On Lexapro 20 mg daily.  Tolerating well.  Needs refill today.  ROS: Per HPI  PMH: She reports that she quit smoking about a year ago. Her smoking use included cigars. She has never used smokeless tobacco. She reports current alcohol use. She reports that she does not use drugs.      Objective:  Physical Exam: BP 131/89   Pulse 100   Temp 98.2 F (36.8 C)   Ht 5\' 7"  (1.702 m)   Wt 181 lb (82.1 kg)   SpO2 98%   BMI 28.35 kg/m   Gen: NAD, resting comfortably CV: Regular rate and rhythm with no murmurs appreciated Pulm: Normal work of breathing, clear to auscultation bilaterally with no crackles, wheezes, or rhonchi      Damareon Lanni M. Jerline Pain, MD 01/14/2019 9:53 AM

## 2019-01-14 NOTE — Progress Notes (Signed)
Please inform patient of the following:  Chest xray is normal. Would like for patient to continue with inhaler and let us know if cough does not improve so that we can refer her to pulmonolgy.

## 2019-01-14 NOTE — Assessment & Plan Note (Signed)
Stable.  Refilled Lexapro 20 mg daily.

## 2019-03-12 ENCOUNTER — Telehealth: Payer: Self-pay

## 2019-03-12 NOTE — Telephone Encounter (Signed)
error 

## 2019-03-13 ENCOUNTER — Ambulatory Visit (INDEPENDENT_AMBULATORY_CARE_PROVIDER_SITE_OTHER): Payer: BLUE CROSS/BLUE SHIELD | Admitting: Family Medicine

## 2019-03-13 DIAGNOSIS — M797 Fibromyalgia: Secondary | ICD-10-CM

## 2019-03-13 NOTE — Assessment & Plan Note (Signed)
Pain is uncontrolled.  She is interested in changing medications.  We will increase amitriptyline to 20 mg nightly for 1 week, then 30 mg nightly.  She will check in with me in a couple of weeks after she increases her dose.  Would like to try to get her to 50 to 75 mg nightly.  Potentially could benefit from another agent such as Lyrica or tramadol though we will try increasing amitriptyline as tolerated first.  She will continue taking baclofen as well.

## 2019-03-13 NOTE — Progress Notes (Signed)
   Melissa [name] is a 39 y.o. female who presents today for a virtual office visit.  Assessment/Plan:  Chronic Problems Addressed Today: Fibromyalgia Pain is uncontrolled.  She is interested in changing medications.  We will increase amitriptyline to 20 mg nightly for 1 week, then 30 mg nightly.  She will check in with me in a couple of weeks after she increases her dose.  Would like to try to get her to 50 to 75 mg nightly.  Potentially could benefit from another agent such as Lyrica or tramadol though we will try increasing amitriptyline as tolerated first.  She will continue taking baclofen as well.    Subjective:  HPI:  See A/P.         Objective/Observations  Physical Exam: Gen: NAD, resting comfortably Pulm: Normal work of breathing Neuro: Grossly normal, moves all extremities Psych: Normal affect and thought content  Virtual Visit via Video   I connected with Melissa [name] on 03/13/19 at 11:00 AM EST by a video enabled telemedicine application and verified that I am speaking with the correct person using two identifiers. The limitations of evaluation and management by telemedicine and the availability of in person appointments were discussed. The patient expressed understanding and agreed to proceed.   Patient location: Home Provider location: [place] [employer] Persons participating in the virtual visit: Myself and Patient     Katina Degree. [name], MD 03/13/2019 11:26 AM

## 2019-04-23 ENCOUNTER — Other Ambulatory Visit: Payer: Self-pay | Admitting: Family Medicine

## 2019-05-02 ENCOUNTER — Telehealth: Payer: Self-pay | Admitting: Family Medicine

## 2019-05-02 ENCOUNTER — Other Ambulatory Visit: Payer: Self-pay | Admitting: Family Medicine

## 2019-05-02 DIAGNOSIS — F321 Major depressive disorder, single episode, moderate: Secondary | ICD-10-CM

## 2019-05-02 MED ORDER — AMITRIPTYLINE HCL 10 MG PO TABS: ORAL_TABLET | ORAL | 2 refills | Status: DC

## 2019-05-02 NOTE — Telephone Encounter (Signed)
  LAST APPOINTMENT DATE: 05/02/2019   NEXT APPOINTMENT DATE:@Visit  date not found  MEDICATION: amitriptyline to 20 mg   [id number] [place], [place] - [address] Phone:  [phone]  Fax:  [phone]       **Let patient know to contact pharmacy at the end of the day to make sure medication is ready. **  ** Please notify patient to allow 48-72 hours to process**  **Encourage patient to contact the pharmacy for refills or they can request refills through [facility]**  CLINICAL FILLS OUT ALL BELOW:   LAST REFILL:  QTY:  REFILL DATE:    OTHER COMMENTS:    Okay for refill?  Please advise

## 2019-05-02 NOTE — Telephone Encounter (Signed)
[facility] [address]

## 2019-05-02 NOTE — Telephone Encounter (Signed)
LAST APPOINTMENT DATE:01/14/2019  NEXT APPOINTMENT DATE:@Visit  date not found  Rx Baclofen 10mg  LAST REFILL:01/03/2019  QTY:45 3Rf

## 2019-05-06 ENCOUNTER — Telehealth: Payer: Self-pay | Admitting: Family Medicine

## 2019-05-06 NOTE — Telephone Encounter (Signed)
Nurse Assessment Nurse: Hollice Espy, RN, Rachael Fee Date/Time Lamount Cohen Time): 05/03/2019 11:56:17 AM Confirm and document reason for call. If symptomatic, describe symptoms. ---Caller states she needs refill of Amitriptyline. None left and she takes it for her fibromyalgia. She is having generalized pain. Pain rated at 8/10 per pt. She has not taken the medication since last week. No other s/s at this time Has the patient had close contact with a person known or suspected to have the novel coronavirus illness OR traveled / lives in area with major community spread (including international travel) in the last 14 days from the onset of symptoms? * If Asymptomatic, screen for exposure and travel within the last 14 days. ---No Does the patient have any new or worsening symptoms? ---Yes Will a triage be completed? ---Yes Related visit to physician within the last 2 weeks? ---No Does the PT have any chronic conditions? (i.e. diabetes, asthma, this includes High risk factors for pregnancy, etc.) ---Yes List chronic conditions. ---Fibromyalgia, IBS Is the patient pregnant or possibly pregnant? (Ask all females between the ages of 13-55) ---No Is this a behavioral health or substance abuse call? ---NoPLEASE NOTE: All timestamps contained within this report are represented as [place] Standard Time. CONFIDENTIALTY NOTICE: This fax transmission is intended only for the addressee. It contains information that is legally privileged, confidential or otherwise protected from use or disclosure. If you are not the intended recipient, you are strictly prohibited from reviewing, disclosing, copying using or disseminating any of this information or taking any action in reliance on or regarding this information. If you have received this fax in error, please notify us immediately by telephone so that we can arrange for its return to [place]. Phone: [phone], Toll-Free: [phone], Fax: [phone] Page: 2 of 2 Call  Id: [id number] Guidelines Guideline Title Affirmed Question Affirmed Notes Nurse Date/Time Lamount Cohen Time) Leg Pain [1] MODERATE pain (e.g., interferes with normal activities, limping) AND [2] present > 3 days Nancy Marus 05/03/2019 11:58:12 AM Disp. Time Lamount Cohen Time) Disposition Final User 05/03/2019 11:41:07 AM Attempt made - message left Nancy Marus 05/03/2019 12:04:11 PM SEE PCP WITHIN 3 DAYS Yes Hollice Espy, RN, Rachael Fee Caller Disagree/Comply Comply Caller Understands Yes PreDisposition Home Care Care Advice Given Per Guideline SEE PCP WITHIN 3 DAYS: * You need to be seen within 2 or 3 days. Call your doctor (or NP/PA) during regular office hours and make an appointment. A clinic or urgent care center are good places to go for care if your doctor's office is closed or you can't get an appointment. NOTE: If office will be open tomorrow, tell caller to call then, not in 3 days. PAIN MEDICINES: * For pain relief, you can take either acetaminophen, ibuprofen, or naproxen. * They are over-the-counter (OTC) pain drugs. You can buy them at the drugstore. PAIN MEDICINES - EXTRA NOTES AND WARNINGS: * Use the lowest amount of medicine that makes your pain better. * Before taking any medicine, read all the instructions on the package. CALL BACK IF: * Severe pain occurs * Calf swelling or constant calf pain occur * Signs of infection occur (e.g., spreading redness, warmth, fever) * You become worse. CARE ADVICE given per Leg Pain (Adult) guideline.

## 2019-05-07 ENCOUNTER — Other Ambulatory Visit: Payer: Self-pay

## 2019-05-07 NOTE — Telephone Encounter (Signed)
Please send in refill if needed.  Katina Degree. [name], MD 05/07/2019 3:10 PM

## 2019-05-07 NOTE — Telephone Encounter (Signed)
FYI

## 2019-05-08 NOTE — Telephone Encounter (Signed)
Rx sent 

## 2019-06-17 ENCOUNTER — Telehealth: Payer: Self-pay | Admitting: Family Medicine

## 2019-06-17 NOTE — Telephone Encounter (Signed)
Patient is calling in asking if Dr.Parker could put in a referral to [facility].

## 2019-06-20 ENCOUNTER — Telehealth: Payer: Self-pay | Admitting: Family Medicine

## 2019-06-20 NOTE — Telephone Encounter (Signed)
Patient calling in not so happy about her referral, she states that she has to have an appointment as soon as possible for a Museum/gallery exhibitions officer . She is not going to [facility]. Please Advise. I advised it could take up to at least a week for the referrals.jk

## 2019-06-21 ENCOUNTER — Telehealth: Payer: Self-pay | Admitting: Family Medicine

## 2019-06-21 ENCOUNTER — Other Ambulatory Visit: Payer: Self-pay | Admitting: *Deleted

## 2019-06-21 DIAGNOSIS — M797 Fibromyalgia: Secondary | ICD-10-CM

## 2019-06-21 NOTE — Telephone Encounter (Signed)
New referral placed.

## 2019-06-21 NOTE — Telephone Encounter (Signed)
Pt called asking if Dr. [name] could put a referral in to Rheumatology at [facility]. Please advise.

## 2019-07-19 ENCOUNTER — Other Ambulatory Visit: Payer: Self-pay

## 2019-07-19 DIAGNOSIS — M329 Systemic lupus erythematosus, unspecified: Secondary | ICD-10-CM

## 2019-08-10 ENCOUNTER — Other Ambulatory Visit: Payer: Self-pay | Admitting: Family Medicine

## 2019-08-13 ENCOUNTER — Other Ambulatory Visit: Payer: Self-pay | Admitting: Family Medicine

## 2019-10-20 ENCOUNTER — Other Ambulatory Visit: Payer: Self-pay | Admitting: Family Medicine

## 2019-10-31 ENCOUNTER — Encounter: Payer: Self-pay | Admitting: Emergency Medicine

## 2019-10-31 ENCOUNTER — Ambulatory Visit
Admission: EM | Admit: 2019-10-31 | Discharge: 2019-10-31 | Disposition: A | Discharge status: Home / Self Care | Payer: BLUE CROSS/BLUE SHIELD | Attending: Emergency Medicine | Admitting: Emergency Medicine

## 2019-10-31 ENCOUNTER — Other Ambulatory Visit: Payer: Self-pay

## 2019-10-31 DIAGNOSIS — T7840XA Allergy, unspecified, initial encounter: Secondary | ICD-10-CM

## 2019-10-31 DIAGNOSIS — T781XXA Other adverse food reactions, not elsewhere classified, initial encounter: Secondary | ICD-10-CM | POA: Diagnosis not present

## 2019-10-31 MED ORDER — METHYLPREDNISOLONE SODIUM SUCC 125 MG IJ SOLR
125.0000 mg | Freq: Once | INTRAMUSCULAR | Status: AC
  Administered 2019-10-31: 125 mg via INTRAMUSCULAR

## 2019-10-31 MED ORDER — HYDROXYZINE HCL 25 MG PO TABS: 25.0000 mg | ORAL_TABLET | Freq: Four times a day (QID) | ORAL | 0 refills | Status: DC

## 2019-10-31 NOTE — ED Triage Notes (Signed)
Pt states she developed hives/rash yesterday and it has been worsening thru today. Pt took benadryl yesterday and today. Pt is aox4 and ambulatory.

## 2019-10-31 NOTE — Discharge Instructions (Signed)
You are given steroid shot today in office. Please take hydroxyzine before bed. May take daytime allergy medication of your choice in the morning. Go to ER if you develop swelling of lips, tongue, throat, chest pain, difficulty breathing.

## 2019-10-31 NOTE — ED Provider Notes (Signed)
[facility]    CSN: [id number] Arrival date & time: 10/31/19  1410      History   Chief Complaint Chief Complaint  Patient presents with  . Allergic Reaction    since yesterday    HPI Melissa [name] is a 39 y.o. female  Presenting for adverse food reaction.  Patient provides history: States this occurred yesterday.  Any new breakfast bar.  States that she "broke out in a rash all over".  Felt like she had some difficulty breathing.  Took a Benadryl yesterday with minimal relief.  Denies chest pain or palpitations, shortness of breath, lightheadedness or dizziness, nausea/vomiting, diarrhea, severe abdominal pain.  Past Medical History:  Diagnosis Date  . Asthma   . IBS (irritable bowel syndrome)   . Migraine     Patient Active Problem List   Diagnosis Date Noted  . Hyperglycemia 11/12/2018  . Dyslipidemia 11/12/2018  . Depression, major, single episode, moderate (HCC) 09/22/2017  . Fibromyalgia 09/22/2017  . RHINITIS, ALLERGIC 03/30/2006  . Asthma 03/30/2006  . IRRITABLE BOWEL SYNDROME 03/30/2006    Past Surgical History:  Procedure Laterality Date  . [facility]  2008    OB History    Gravida      Para      Term      Preterm      AB      Living  0     SAB      TAB      Ectopic      Multiple      Live Births               Home Medications    Prior to Admission medications   Medication Sig Start Date End Date Taking? Authorizing Provider  albuterol (VENTOLIN HFA) 108 (90 Base) MCG/ACT inhaler TAKE 2 PUFFS BY MOUTH EVERY 6 HOURS AS NEEDED FOR WHEEZE OR SHORTNESS OF BREATH 01/03/19  Yes Ardith Dark, MD  amitriptyline (ELAVIL) 10 MG tablet TAKE 1 TABLET BY MOUTH EVERYDAY AT BEDTIME 05/02/19  Yes Ardith Dark, MD  Ascorbic Acid (VITAMIN C) 1000 MG tablet Take 1,000 mg by mouth daily.   Yes [provider]  baclofen (LIORESAL) 10 MG tablet TAKE 0.5 TABLETS (5 MG TOTAL) BY MOUTH 3 (THREE) TIMES DAILY. 05/02/19  Yes Ardith Dark, MD  budesonide-formoterol [facility]) 80-4.5 MCG/ACT inhaler Inhale 2 puffs into the lungs 2 (two) times daily. 01/03/19  Yes Ardith Dark, MD  cholecalciferol (VITAMIN D) 1000 units tablet Take 1,000 Units by mouth daily.   Yes [provider]  escitalopram (LEXAPRO) 20 MG tablet TAKE 1 TABLET BY MOUTH EVERY DAY 04/24/19  Yes Ardith Dark, MD  meclizine (ANTIVERT) 25 MG tablet TAKE 1 TABLET (25 MG TOTAL) BY MOUTH 2 (TWO) TIMES DAILY AS NEEDED FOR DIZZINESS. 08/13/19  Yes Ardith Dark, MD  Multiple Vitamin (MULTIVITAMIN) tablet Take 1 tablet by mouth daily.   Yes [provider]  Omega-3 Fatty Acids (FISH OIL) 1000 MG CAPS Take by mouth.   Yes [provider]  pantoprazole (PROTONIX) 40 MG tablet TAKE 1 TABLET BY MOUTH EVERY DAY 10/21/19  Yes Ardith Dark, MD  pyridoxine (B-6) 250 MG tablet Take 250 mg by mouth daily.   Yes [provider]  Riboflavin (B-2-400 PO) Take by mouth.   Yes [provider]  vitamin B-12 (CYANOCOBALAMIN) 1000 MCG tablet Take 1,000 mcg by mouth daily.   Yes [provider]  albuterol (PROVENTIL) (2.5 MG/3ML) 0.083% nebulizer solution Take 3 mLs (2.5 mg total) by nebulization every 6 (six) hours as needed for wheezing or shortness of breath. 01/03/19   Ardith Dark, MD  azithromycin [facility]) 250 MG tablet Take 2 tabs day 1, then 1 tab daily 01/03/19   Ardith Dark, MD  COD LIVER OIL PO Take by mouth.    [provider]  dicyclomine (BENTYL) 20 MG tablet TAKE 1 TABLET (20 MG TOTAL) BY MOUTH 4 (FOUR) TIMES DAILY - BEFORE MEALS AND AT BEDTIME. 08/12/19   Ardith Dark, MD  hydrOXYzine (ATARAX/VISTARIL) 25 MG tablet Take 1 tablet (25 mg total) by mouth every 6 (six) hours. 10/31/19   Hall-Potvin, [place], PA-C  Olopatadine HCl (PATADAY) 0.2 % SOLN Apply 1 drop to each eye once daily. 01/03/19   Ardith Dark, MD  trimethoprim-polymyxin b (POLYTRIM) ophthalmic solution Place 1 drop into both eyes  every 4 (four) hours. 01/03/19   Ardith Dark, MD    Family History Family History  Problem Relation Age of Onset  . Arthritis Mother   . Diabetes Mother   . High Cholesterol Mother   . Cancer Father   . High blood pressure Father   . High Cholesterol Sister   . High blood pressure Sister   . Kidney disease Maternal Grandmother   . Cancer Maternal Grandfather   . Heart attack Maternal Grandfather   . High Cholesterol Paternal Grandmother   . High blood pressure Paternal Grandmother   . High blood pressure Paternal Grandfather     Social History Social History   Tobacco Use  . Smoking status: Former Smoker    Types: Cigars    Quit date: 01/01/2018    Years since quitting: 1.8  . Smokeless tobacco: Never Used  . Tobacco comment: Black and Milds  Vaping Use  . Vaping Use: Never used  Substance Use Topics  . Alcohol use: Yes  . Drug use: No     Allergies   Asa [aspirin], Latex, Penicillins, and Vitamin e   Review of Systems As per HPI   Physical Exam Triage Vital Signs ED Triage Vitals  Enc Vitals Group     BP      Pulse      Resp      Temp      Temp src      SpO2      Weight      Height      Head Circumference      Peak Flow      Pain Score      Pain Loc      Pain Edu?      Excl. in GC?    No data found.  Updated Vital Signs BP (!) 143/99 (BP Location: Right Arm)   Pulse 100   Temp 98.3 F (36.8 C) (Oral)   Resp 19   LMP 10/17/2019 (Approximate)   SpO2 98%   Visual Acuity Right Eye Distance:   Left Eye Distance:   Bilateral Distance:    Right Eye Near:   Left Eye Near:    Bilateral Near:     Physical Exam Constitutional:      General: She is not in acute distress.    Appearance: She is not ill-appearing or diaphoretic.  HENT:     Head: Normocephalic and atraumatic.     Mouth/Throat:     Mouth: Mucous membranes are moist.     Pharynx: Oropharynx is  clear. No oropharyngeal exudate or posterior oropharyngeal erythema.  Eyes:      General: No scleral icterus.    Conjunctiva/sclera: Conjunctivae normal.     Pupils: Pupils are equal, round, and reactive to light.  Neck:     Comments: Trachea midline, negative JVD Cardiovascular:     Rate and Rhythm: Normal rate and regular rhythm.     Heart sounds: No murmur heard.  No gallop.   Pulmonary:     Effort: Pulmonary effort is normal. No respiratory distress.     Breath sounds: No wheezing, rhonchi or rales.  Musculoskeletal:     Cervical back: Neck supple. No tenderness.  Lymphadenopathy:     Cervical: No cervical adenopathy.  Skin:    Capillary Refill: Capillary refill takes less than 2 seconds.     Coloration: Skin is not jaundiced or pale.     Findings: Rash present.     Comments: Trace rash noted to bilateral proximal upper extremities, chest.  Neurological:     General: No focal deficit present.     Mental Status: She is alert and oriented to person, place, and time.      UC Treatments / Results  Labs (all labs ordered are listed, but only abnormal results are displayed) Labs Reviewed - No data to display  EKG   Radiology No results found.  Procedures Procedures (including critical care time)  Medications Ordered in UC Medications  methylPREDNISolone sodium succinate (SOLU-MEDROL) 125 mg/2 mL injection 125 mg (has no administration in time range)    Initial Impression / Assessment and Plan / UC Course  I have reviewed the triage vital signs and the nursing notes.  Pertinent labs & imaging results that were available during my care of the patient were reviewed by me and considered in my medical decision making (see chart for details).     Patient febrile, nontoxic in office today.  Hemodynamically stable with patent airways.  Given Solu-Medrol in office which he tolerated well.  Will continue hydroxyzine, food avoidance.  Work note provided at patient's request.  Return precautions discussed, pt verbalized understanding and is agreeable to  plan. Final Clinical Impressions(s) / UC Diagnoses   Final diagnoses:  Allergic reaction, initial encounter  Adverse food reaction, initial encounter     Discharge Instructions     You are given steroid shot today in office. Please take hydroxyzine before bed. May take daytime allergy medication of your choice in the morning. Go to ER if you develop swelling of lips, tongue, throat, chest pain, difficulty breathing.    ED Prescriptions    Medication Sig Dispense Auth. Provider   hydrOXYzine (ATARAX/VISTARIL) 25 MG tablet Take 1 tablet (25 mg total) by mouth every 6 (six) hours. 12 tablet Hall-Potvin, [place], PA-C     PDMP not reviewed this encounter.   Hall-Potvin, [place], [place] 10/31/19 1434

## 2019-11-02 ENCOUNTER — Other Ambulatory Visit: Payer: Self-pay | Admitting: Family Medicine

## 2019-11-18 LAB — HEPATIC FUNCTION PANEL
ALT: 19 (ref 7–35)
AST: 21 (ref 13–35)
Alkaline Phosphatase: 43 (ref 25–125)
Bilirubin, Total: 0.5

## 2019-11-18 LAB — CBC AND DIFFERENTIAL
HCT: 41 (ref 36–46)
Hemoglobin: 13.5 (ref 12.0–16.0)
Neutrophils Absolute: 3.7
Platelets: 316 (ref 150–399)
WBC: 6.2

## 2019-11-18 LAB — CBC: RBC: 4.77 (ref 3.87–5.11)

## 2019-11-18 LAB — BASIC METABOLIC PANEL
BUN: 8 (ref 4–21)
CO2: 28 — AB (ref 13–22)
Chloride: 98 — AB (ref 99–108)
Creatinine: 0.9 (ref <=1.1)
Glucose: 94
Potassium: 4.1 (ref 3.4–5.3)
Sodium: 135 — AB (ref 137–147)

## 2019-11-18 LAB — COMPREHENSIVE METABOLIC PANEL
Albumin: 4.6 (ref 3.5–5.0)
Calcium: 9.7 (ref 8.7–10.7)
GFR calc Af Amer: <=90

## 2019-12-06 ENCOUNTER — Encounter: Payer: Self-pay | Admitting: Family Medicine

## 2020-01-12 ENCOUNTER — Other Ambulatory Visit: Payer: Self-pay | Admitting: Family Medicine

## 2020-01-18 ENCOUNTER — Other Ambulatory Visit: Payer: Self-pay | Admitting: Family Medicine

## 2020-01-27 ENCOUNTER — Telehealth: Payer: Self-pay

## 2020-01-27 ENCOUNTER — Telehealth: Payer: Self-pay | Admitting: *Deleted

## 2020-01-27 ENCOUNTER — Other Ambulatory Visit: Payer: Self-pay | Admitting: Family Medicine

## 2020-01-27 DIAGNOSIS — Z8669 Personal history of other diseases of the nervous system and sense organs: Secondary | ICD-10-CM

## 2020-01-27 NOTE — Telephone Encounter (Signed)
Pt called to get appt scheduled. She would be a new pt to our office. Has not had referral placed to our office. She will contact PCP or Rheumatologist to get referral placed and then will call back to get scheduled. She was previously followed at [facility].

## 2020-01-27 NOTE — Telephone Encounter (Signed)
Ok to place referral to [facility]? Please advise

## 2020-01-27 NOTE — Telephone Encounter (Signed)
Referral has been placed to [facility].

## 2020-01-27 NOTE — Telephone Encounter (Signed)
Ok with me. Please place any necessary orders. 

## 2020-01-27 NOTE — Addendum Note (Signed)
Addended by: Manuela Schwartz on: 01/27/2020 10:28 AM   Modules accepted: Orders

## 2020-01-27 NOTE — Telephone Encounter (Signed)
Pt is requesting a referral to [facility]. Pt has a neuro dr at [facility]. She wants to change. Pt is experiencing constant head pain and migraines.

## 2020-02-11 ENCOUNTER — Telehealth: Payer: Self-pay

## 2020-02-11 ENCOUNTER — Other Ambulatory Visit: Payer: Self-pay | Admitting: Family Medicine

## 2020-02-11 MED ORDER — DICYCLOMINE HCL 20 MG PO TABS: 20.0000 mg | ORAL_TABLET | Freq: Three times a day (TID) | ORAL | 0 refills | Status: DC

## 2020-02-11 NOTE — Telephone Encounter (Signed)
MEDICATION: Dicyclomine 20 MG  [facility]ACY[facility] [address]  Comments:   **Let patient know to contact pharmacy at the end of the day to make sure medication is ready. **  ** Please notify patient to allow 48-72 hours to process**  **Encourage patient to contact the pharmacy for refills or they can request refills through [facility]**

## 2020-02-11 NOTE — Telephone Encounter (Signed)
Medication sent in. 

## 2020-02-14 ENCOUNTER — Other Ambulatory Visit: Payer: Self-pay | Admitting: Family Medicine

## 2020-02-14 DIAGNOSIS — F321 Major depressive disorder, single episode, moderate: Secondary | ICD-10-CM

## 2020-03-03 ENCOUNTER — Other Ambulatory Visit: Payer: Self-pay | Admitting: Family Medicine

## 2020-03-03 DIAGNOSIS — Z1231 Encounter for screening mammogram for malignant neoplasm of breast: Secondary | ICD-10-CM

## 2020-04-07 ENCOUNTER — Other Ambulatory Visit: Payer: Self-pay | Admitting: Neurology

## 2020-04-07 ENCOUNTER — Other Ambulatory Visit: Payer: Self-pay

## 2020-04-07 ENCOUNTER — Telehealth: Payer: Self-pay | Admitting: *Deleted

## 2020-04-07 ENCOUNTER — Encounter: Payer: Self-pay | Admitting: Neurology

## 2020-04-07 ENCOUNTER — Ambulatory Visit (INDEPENDENT_AMBULATORY_CARE_PROVIDER_SITE_OTHER): Payer: 59 | Admitting: Neurology

## 2020-04-07 VITALS — BP 132/94 | HR 85 | Ht 67.5 in | Wt 190.0 lb

## 2020-04-07 DIAGNOSIS — R5383 Other fatigue: Secondary | ICD-10-CM

## 2020-04-07 DIAGNOSIS — R0683 Snoring: Secondary | ICD-10-CM

## 2020-04-07 DIAGNOSIS — G43711 Chronic migraine without aura, intractable, with status migrainosus: Secondary | ICD-10-CM | POA: Diagnosis not present

## 2020-04-07 DIAGNOSIS — R519 Headache, unspecified: Secondary | ICD-10-CM

## 2020-04-07 DIAGNOSIS — G43911 Migraine, unspecified, intractable, with status migrainosus: Secondary | ICD-10-CM | POA: Insufficient documentation

## 2020-04-07 DIAGNOSIS — G4719 Other hypersomnia: Secondary | ICD-10-CM

## 2020-04-07 DIAGNOSIS — R51 Headache with orthostatic component, not elsewhere classified: Secondary | ICD-10-CM

## 2020-04-07 DIAGNOSIS — G8929 Other chronic pain: Secondary | ICD-10-CM

## 2020-04-07 DIAGNOSIS — H539 Unspecified visual disturbance: Secondary | ICD-10-CM

## 2020-04-07 MED ORDER — FREMANEZUMAB-VFRM 225 MG/1.5ML ~~LOC~~ SOSY
25.0000 mg | PREFILLED_SYRINGE | Freq: Once | SUBCUTANEOUS | Status: DC
Start: 2020-04-07 — End: 2022-12-08

## 2020-04-07 MED ORDER — AJOVY 225 MG/1.5ML ~~LOC~~ SOAJ: 225.0000 mg | SUBCUTANEOUS | 11 refills | Status: DC

## 2020-04-07 NOTE — Progress Notes (Addendum)
[facility]    Provider:  Dr Lucia Gaskins Requesting Provider: Ardith Dark, MD Primary Care Provider:  Ardith Dark, MD  CC:  Intractable migraines  HPI:  Melissa [name] is a 39 y.o. female here as requested by Ardith Dark, MD for migraines. She is here with her mother who also provides much information. Pmhx migraine. Her headaches used to be around her eyes, now it is at the crown of the head,started 10 years ago,  She has been to multiple neurologists in [place] and [employer] [facility]. She has migraines every day that are moderately severe, pulsating/pounding, pain, throbbing, light and sound sensitivity, nausea, no vomiting, movement make it worse, bending over makes it worse, she has associated vertigo, wakes up with the migraines and headaches, when she gets up and she sits up its awful, Emgality helped a little but didn't help entirely, she did no ttry any of the other medications, No aura. No medication overuse. Ongoing for over a year at this severity, they can last as long as a week. Here with mother who also provides much information. She has vision loss with the headaches and blurry vision.  Can be 9-10 out of 10 in pain, no associated unilateral numbness or weakness, worsened around her menses, Emgality helped a little bit, no other focal neurologic deficits, associated symptoms, inciting events or modifiable factors.  From a thorough review of records, medications tried that can be used in migraine management includes; Elavil, emgality, baclofen, flexeril, lexapro, neurontin, antivert, naproxen, topiramate(side effects), prednisone tabs, sumatriptan, emgality,verapamil, Flexeril, hydroxyzine, gabapentin, naproxen, ibuprofen, Imitrex nasal spray  Reviewed notes, labs and imaging from outside physicians, which showed:    I reviewed Dr. Moises Blood notes, neurologist at [facility] neurology, from 2019, patient reported migraine started at the age of 22-33, at the crown  occipital region sharp, severity 8-9 over 10, no prodrome or post trauma, associated nausea vomiting photophobia phonophobia osmophobia blurred vision, no unilateral numbness or weakness, having them at least 15 headache days a month, more around her menses, stress can be a trigger, Review of Systems: Patient complains of symptoms per HPI as well as the following symptoms: body pain/fibromyalgia. Pertinent negatives and positives per HPI. All others negative.   Social History   Socioeconomic History  . Marital status: Single    Spouse name: Not on file  . Number of children: 0  . Years of education: Not on file  . Highest education level: Bachelor's degree (e.g., BA, AB, BS)  Occupational History  . Occupation: Marine scientist: [employer]  Tobacco Use  . Smoking status: Former Smoker    Types: Cigars    Quit date: 01/01/2018    Years since quitting: 2.2  . Smokeless tobacco: Never Used  . Tobacco comment: Black and Milds  Vaping Use  . Vaping Use: Never used  Substance and Sexual Activity  . Alcohol use: Yes    Alcohol/week: 2.0 standard drinks    Types: 2 Cans of beer per week  . Drug use: No  . Sexual activity: Not Currently  Other Topics Concern  . Not on file  Social History Narrative   Patient is right-handed. She lives in a single level home. Her mother lives with her. She does not exercise.   Caffeine: Kombucha   Social Determinants of Health   Financial Resource Strain: Not on file  Food Insecurity: Not on file  Transportation Needs: Not on file  Physical Activity: Not on file  Stress:  Not on file  Social Connections: Not on file  Intimate Partner Violence: Not on file    Family History  Problem Relation Age of Onset  . Arthritis Mother   . Diabetes Mother   . High Cholesterol Mother   . Migraines Mother   . Cancer Father   . High blood pressure Father   . High Cholesterol Sister   . High blood pressure Sister   . Kidney disease Maternal  Grandmother   . Cancer Maternal Grandfather   . Heart attack Maternal Grandfather   . High Cholesterol Paternal Grandmother   . High blood pressure Paternal Grandmother   . High blood pressure Paternal Grandfather     Past Medical History:  Diagnosis Date  . Asthma   . Fibromyalgia   . IBS (irritable bowel syndrome)   . Migraine     Patient Active Problem List   Diagnosis Date Noted  . Chronic migraine without aura, with intractable migraine, so stated, with status migrainosus 04/07/2020  . Hyperglycemia 11/12/2018  . Dyslipidemia 11/12/2018  . Depression, major, single episode, moderate (HCC) 09/22/2017  . Fibromyalgia 09/22/2017  . RHINITIS, ALLERGIC 03/30/2006  . Asthma 03/30/2006  . IRRITABLE BOWEL SYNDROME 03/30/2006    Past Surgical History:  Procedure Laterality Date  . [facility]  2008    Current Outpatient Medications  Medication Sig Dispense Refill  . albuterol (PROVENTIL) (2.5 MG/3ML) 0.083% nebulizer solution Take 3 mLs (2.5 mg total) by nebulization every 6 (six) hours as needed for wheezing or shortness of breath. 150 mL 1  . albuterol (VENTOLIN HFA) 108 (90 Base) MCG/ACT inhaler TAKE 2 PUFFS BY MOUTH EVERY 6 HOURS AS NEEDED FOR WHEEZE OR SHORTNESS OF BREATH 18 g 3  . amitriptyline (ELAVIL) 10 MG tablet TAKE 1 TABLET BY MOUTH EVERYDAY AT BEDTIME 90 tablet 0  . Ascorbic Acid (VITAMIN C) 1000 MG tablet Take 1,000 mg by mouth daily.    . baclofen (LIORESAL) 10 MG tablet TAKE 0.5 TABLETS (5 MG TOTAL) BY MOUTH 3 (THREE) TIMES DAILY. 135 tablet 1  . budesonide-formoterol (SYMBICORT) 80-4.5 MCG/ACT inhaler Inhale 2 puffs into the lungs 2 (two) times daily. 1 Inhaler 3  . CHLOROPHYLL PO Take by mouth.    . cholecalciferol (VITAMIN D) 1000 units tablet Take 1,000 Units by mouth daily.    . COD LIVER OIL PO Take by mouth.    . Cyanocobalamin (VITAMIN B-12 PO) Take 1,000 mcg by mouth daily.    Marland Kitchen dicyclomine (BENTYL) 20 MG tablet Take 1 tablet (20 mg total) by mouth 4 (four)  times daily -  before meals and at bedtime. 270 tablet 0  . escitalopram (LEXAPRO) 20 MG tablet TAKE 1 TABLET BY MOUTH EVERY DAY 90 tablet 3  . Fremanezumab-vfrm (AJOVY) 225 MG/1.5ML SOAJ Inject 225 mg into the skin every 30 (thirty) days. 1.5 mL 11  . meclizine (ANTIVERT) 25 MG tablet TAKE 1 TABLET (25 MG TOTAL) BY MOUTH 2 (TWO) TIMES DAILY AS NEEDED FOR DIZZINESS. 90 tablet 1  . Multiple Vitamin (MULTIVITAMIN) tablet Take 1 tablet by mouth daily.    . Omega-3 Fatty Acids (FISH OIL) 1000 MG CAPS Take by mouth.    Marland Kitchen OVER THE COUNTER MEDICATION Red beet water mix-in    . pantoprazole (PROTONIX) 40 MG tablet TAKE 1 TABLET BY MOUTH EVERY DAY 90 tablet 3  . pyridoxine (B-6) 250 MG tablet Take 250 mg by mouth daily.    . Riboflavin (B-2-400 PO) Take by mouth.  Current Facility-Administered Medications  Medication Dose Route Frequency Provider Last Rate Last Admin  . Fremanezumab-vfrm SOSY 25 mg  25 mg Subcutaneous Once Anson FretAhern, Chrissa Meetze B, MD        Allergies as of 04/07/2020 - Review Complete 04/07/2020  Allergen Reaction Noted  . Asa [aspirin]  06/30/2014  . Latex  06/30/2014  . Penicillins  11/24/2017  . Vitamin e Hives 04/14/2009    Vitals: BP (!) 132/94 (BP Location: Right Arm, Patient Position: Sitting)   Pulse 85   Ht 5' 7.5" (1.715 m)   Wt 190 lb (86.2 kg)   BMI 29.32 kg/m  Last Weight:  Wt Readings from Last 1 Encounters:  04/07/20 190 lb (86.2 kg)   Last Height:   Ht Readings from Last 1 Encounters:  04/07/20 5' 7.5" (1.715 m)     Physical exam: Exam: Gen: NAD, conversant, well nourised,  well groomed                     CV: RRR, no MRG. No Carotid Bruits. No peripheral edema, warm, nontender Eyes: Conjunctivae clear without exudates or hemorrhage  Neuro: Detailed Neurologic Exam  Speech:    Speech is normal; fluent and spontaneous with normal comprehension.  Cognition:    The patient is oriented to person, place, and time;     recent and remote memory  intact;     language fluent;     normal attention, concentration,     fund of knowledge Cranial Nerves:    The pupils are equal, round, and reactive to light. The fundi are flat. Visual fields are full to finger confrontation. Extraocular movements are intact. Trigeminal sensation is intact and the muscles of mastication are normal. The face is symmetric. The palate elevates in the midline. Hearing intact. Voice is normal. Shoulder shrug is normal. The tongue has normal motion without fasciculations.   Coordination:    No dysmetria or ataxia  Gait:    Normal native gait  Motor Observation:    No asymmetry, no atrophy, and no involuntary movements noted. Tone:    Normal muscle tone.    Posture:    Posture is normal. normal erect    Strength:    Strength is V/V in the upper and lower limbs.      Sensation: intact to LT     Reflex Exam:  DTR's:    Deep tendon reflexes in the upper and lower extremities are normal bilaterally.   Toes:    The toes are downgoing bilaterally.   Clonus:    Clonus is absent.    Assessment/Plan:  40 year old with chronic migraines and chronic daily headaches  Start Ajovy for prevention MRI: MRI brain due to concerning symptoms of morning headaches, positional headaches,vision changes  to look for space occupying mass, chiari or intracranial hypertension (pseudotumor). Excessive daytime somnolence(ESS 18/30), snoring, morning headache: sleep evaluation  Orders Placed This Encounter  Procedures  . MR BRAIN W WO CONTRAST  . Comprehensive metabolic panel  . CBC with Differential/Platelets  . TSH  . Ambulatory referral to Sleep Studies   Meds ordered this encounter  Medications  . Fremanezumab-vfrm (AJOVY) 225 MG/1.5ML SOAJ    Sig: Inject 225 mg into the skin every 30 (thirty) days.    Dispense:  1.5 mL    Refill:  11    Patient has copay card; she can have medication regardless of insurance approval or copay amount.  Darlys Gales. Fremanezumab-vfrm  SOSY 25 mg  [facility] 5/22   Discussed: To prevent or relieve headaches, try the following: Cool Compress. Lie down and place a cool compress on your head.  Avoid headache triggers. If certain foods or odors seem to have triggered your migraines in the past, avoid them. A headache diary might help you identify triggers.  Include physical activity in your daily routine. Try a daily walk or other moderate aerobic exercise.  Manage stress. Find healthy ways to cope with the stressors, such as delegating tasks on your to-do list.  Practice relaxation techniques. Try deep breathing, yoga, massage and visualization.  Eat regularly. Eating regularly scheduled meals and maintaining a healthy diet might help prevent headaches. Also, drink plenty of fluids.  Follow a regular sleep schedule. Sleep deprivation might contribute to headaches Consider biofeedback. With this mind-body technique, you learn to control certain bodily functions -- such as muscle tension, heart rate and blood pressure -- to prevent headaches or reduce headache pain.    Proceed to emergency room if you experience new or worsening symptoms or symptoms do not resolve, if you have new neurologic symptoms or if headache is severe, or for any concerning symptom.   Provided education and documentation from American headache Society toolbox including articles on: chronic migraine medication overuse headache, chronic migraines, prevention of migraines, behavioral and other nonpharmacologic treatments for headache.  Cc: Ardith Dark, MD,  Ardith Dark, MD  Naomie Dean, MD  [facility] [address] [place], [place] [address]  Phone [phone] Fax [phone]

## 2020-04-07 NOTE — Patient Instructions (Signed)
Start Ajovy (Do not get pregnant during Ajovy or for 6 months after stopping it) MRI of the brain Sleep evaluation  Fremanezumab injection What is this medicine? FREMANEZUMAB (fre ma NEZ ue mab) is used to prevent migraine headaches. This medicine may be used for other purposes; ask your health care provider or pharmacist if you have questions. COMMON BRAND NAME(S): AJOVY What should I tell my health care provider before I take this medicine? They need to know if you have any of these conditions:  an unusual or allergic reaction to fremanezumab, other medicines, foods, dyes, or preservatives  pregnant or trying to get pregnant  breast-feeding How should I use this medicine? This medicine is for injection under the skin. You will be taught how to prepare and give this medicine. Use exactly as directed. Take your medicine at regular intervals. Do not take your medicine more often than directed. It is important that you put your used needles and syringes in a special sharps container. Do not put them in a trash can. If you do not have a sharps container, call your pharmacist or healthcare provider to get one. Talk to your pediatrician regarding the use of this medicine in children. Special care may be needed. Overdosage: If you think you have taken too much of this medicine contact a poison control center or emergency room at once. NOTE: This medicine is only for you. Do not share this medicine with others. What if I miss a dose? If you miss a dose, take it as soon as you can. If it is almost time for your next dose, take only that dose. Do not take double or extra doses. What may interact with this medicine? Interactions are not expected. This list may not describe all possible interactions. Give your health care provider a list of all the medicines, herbs, non-prescription drugs, or dietary supplements you use. Also tell them if you smoke, drink alcohol, or use illegal drugs. Some items may  interact with your medicine. What should I watch for while using this medicine? Tell your doctor or healthcare professional if your symptoms do not start to get better or if they get worse. What side effects may I notice from receiving this medicine? Side effects that you should report to your doctor or health care professional as soon as possible:  allergic reactions like skin rash, itching or hives, swelling of the face, lips, or tongue Side effects that usually do not require medical attention (report these to your doctor or health care professional if they continue or are bothersome):  pain, redness, or irritation at site where injected This list may not describe all possible side effects. Call your doctor for medical advice about side effects. You may report side effects to FDA at 1-800-FDA-1088. Where should I keep my medicine? Keep out of the reach of children. You will be instructed on how to store this medicine. Throw away any unused medicine after the expiration date on the label. NOTE: This sheet is a summary. It may not cover all possible information. If you have questions about this medicine, talk to your doctor, pharmacist, or health care provider.  2021 Elsevier/Gold Standard (2016-10-17 17:22:56)

## 2020-04-07 NOTE — Addendum Note (Signed)
Addended by: Naomie Dean B on: 04/07/2020 08:54 AM   Modules accepted: Orders

## 2020-04-07 NOTE — Telephone Encounter (Signed)
Completed Ajovy PA on Cover My Meds. Key: [id number]. Awaiting determination from [employer].

## 2020-04-08 ENCOUNTER — Telehealth: Payer: Self-pay | Admitting: Neurology

## 2020-04-08 LAB — COMPREHENSIVE METABOLIC PANEL
ALT: 49 IU/L — ABNORMAL HIGH (ref 0–32)
AST: 45 IU/L — ABNORMAL HIGH (ref 0–40)
Albumin/Globulin Ratio: 1.4 (ref 1.2–2.2)
Albumin: 4.5 g/dL (ref 3.8–4.8)
Alkaline Phosphatase: 51 IU/L (ref 44–121)
BUN/Creatinine Ratio: 9 (ref 9–23)
BUN: 9 mg/dL (ref 6–24)
Bilirubin Total: 0.2 mg/dL (ref 0.0–1.2)
CO2: 22 mmol/L (ref 20–29)
Calcium: 9.4 mg/dL (ref 8.7–10.2)
Chloride: 102 mmol/L (ref 96–106)
Creatinine, Ser: 1.05 mg/dL — ABNORMAL HIGH (ref 0.57–1.00)
Globulin, Total: 3.2 g/dL (ref 1.5–4.5)
Glucose: 114 mg/dL — ABNORMAL HIGH (ref 65–99)
Potassium: 4.7 mmol/L (ref 3.5–5.2)
Sodium: 140 mmol/L (ref 134–144)
Total Protein: 7.7 g/dL (ref 6.0–8.5)
eGFR: 68 mL/min/{1.73_m2} (ref >59)

## 2020-04-08 LAB — CBC WITH DIFFERENTIAL/PLATELET
Basophils Absolute: 0 10*3/uL (ref 0.0–0.2)
Basos: 1 %
EOS (ABSOLUTE): 0.1 10*3/uL (ref 0.0–0.4)
Eos: 1 %
Hematocrit: 38.7 % (ref 34.0–46.6)
Hemoglobin: 13.1 g/dL (ref 11.1–15.9)
Immature Grans (Abs): 0 10*3/uL (ref 0.0–0.1)
Immature Granulocytes: 0 %
Lymphocytes Absolute: 2.8 10*3/uL (ref 0.7–3.1)
Lymphs: 48 %
MCH: 29 pg (ref 26.6–33.0)
MCHC: 33.9 g/dL (ref 31.5–35.7)
MCV: 86 fL (ref 79–97)
Monocytes Absolute: 0.5 10*3/uL (ref 0.1–0.9)
Monocytes: 8 %
Neutrophils Absolute: 2.5 10*3/uL (ref 1.4–7.0)
Neutrophils: 42 %
Platelets: 277 10*3/uL (ref 150–450)
RBC: 4.52 x10E6/uL (ref 3.77–5.28)
RDW: 13.3 % (ref 11.7–15.4)
WBC: 5.8 10*3/uL (ref 3.4–10.8)

## 2020-04-08 LAB — TSH: TSH: 0.855 u[IU]/mL (ref 0.450–4.500)

## 2020-04-08 NOTE — Telephone Encounter (Signed)
[employer] auth: [id number] (exp. 04/08/20 to 05/07/20) order sent to GI. They will reach out to the patient to schedule.

## 2020-04-09 ENCOUNTER — Other Ambulatory Visit: Payer: Self-pay | Admitting: Neurology

## 2020-04-09 ENCOUNTER — Telehealth: Payer: Self-pay | Admitting: Neurology

## 2020-04-09 NOTE — Addendum Note (Signed)
Addended by: Naomie Dean B on: 04/09/2020 08:32 PM   Modules accepted: Orders

## 2020-04-09 NOTE — Telephone Encounter (Signed)
Done, thanks

## 2020-04-09 NOTE — Telephone Encounter (Signed)
Pt called, I am allergic to the contrast. [facility] suggested for me to call  Dr.Ahern to send a order for MRI without contrast. Would like a call from the nurse.

## 2020-04-13 NOTE — Telephone Encounter (Signed)
Noted! [employer] auth: [id number] (exp. 04/08/20 to 05/07/20) order sent to GI. They will reach out to the patient to schedule.

## 2020-04-14 ENCOUNTER — Other Ambulatory Visit: Payer: Self-pay | Admitting: Family Medicine

## 2020-04-30 ENCOUNTER — Other Ambulatory Visit: Payer: Self-pay | Admitting: Family Medicine

## 2020-05-07 ENCOUNTER — Ambulatory Visit (INDEPENDENT_AMBULATORY_CARE_PROVIDER_SITE_OTHER): Payer: 59 | Admitting: Neurology

## 2020-05-07 ENCOUNTER — Encounter: Payer: Self-pay | Admitting: Neurology

## 2020-05-07 ENCOUNTER — Other Ambulatory Visit: Payer: Self-pay | Admitting: Family Medicine

## 2020-05-07 VITALS — BP 128/72 | HR 85 | Ht 67.0 in | Wt 182.0 lb

## 2020-05-07 DIAGNOSIS — G4719 Other hypersomnia: Secondary | ICD-10-CM

## 2020-05-07 DIAGNOSIS — G4711 Idiopathic hypersomnia with long sleep time: Secondary | ICD-10-CM

## 2020-05-07 DIAGNOSIS — R5382 Chronic fatigue, unspecified: Secondary | ICD-10-CM | POA: Diagnosis not present

## 2020-05-07 DIAGNOSIS — F321 Major depressive disorder, single episode, moderate: Secondary | ICD-10-CM

## 2020-05-07 NOTE — Patient Instructions (Signed)

## 2020-05-07 NOTE — Progress Notes (Signed)
SLEEP MEDICINE CLINIC    Provider:  Melvyn Novas, MD  Primary Care Physician:  Ardith Dark, MD [address] [place] [address]     Referring Provider: Ardith Dark, Md [address] [place],  [place] [address]          Chief Complaint according to patient   Patient presents with:    . New Patient (Initial Visit)           HISTORY OF PRESENT ILLNESS:  Melissa [name] is a 40 y.o. year old Black or [place] American female patient seen here upon  referral by Dr Lucia Gaskins on 05/07/2020 Chief concern according to patient :   Pt alone, rm 10. Presents today to assess OSA a concern. She has never had a SS. States that her ex BF and mom have witnessed apnea events. Never been told she snores.  She avg 10/11 hrs of sleep and states that is sounds sleep. When she wakes up she is still tired.    Melissa [name]  has a past medical history of Asthma,IBS (irritable bowel syndrome), and Migraine.(migraines began in her thirties, getting more frequent).     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   and lives in a household with BF. 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 Tobacco use*= quit 2.5 years ago   ETOH use - " daily glass of wine ",  Caffeine intake none . Regular exercise - none .         Sleep habits are as follows: The patient's dinner time is between 8 PM. The patient goes to bed at 9 PM and continues to sleep for 10 hours, wakes for 1-2 bathroom breaks.   The preferred sleep position is sideways , with the support of 2 pillows.  Dreams are reportedly infrequent.  7.15   AM is the usual rise time. The patient wakes up with an alarm.  She reports not feeling refreshed or restored in AM, with symptoms such as some morning headaches.  Naps are taken frequently, lasting from 5 hours  minutes and are no more refreshing than nocturnal sleep.    Review of  Systems: Out of a complete 14 system review, the patient complains of only the following symptoms, and all other reviewed systems are negative.:  Fatigue, sleepiness , snoring, hypersomnia    How likely are you to doze in the following situations: 0 = not likely, 1 = slight chance, 2 = moderate chance, 3 = high chance   Sitting and Reading? Watching Television? Sitting inactive in a public place (theater or meeting)? As a passenger in a car for an hour without a break? Lying down in the afternoon when circumstances permit? Sitting and talking to someone? Sitting quietly after lunch without alcohol? In a car, while stopped for a few minutes in traffic?   Total = 15*/ 24 points   FSS endorsed at 59/ 63 points.    Sleep paralysis. No vivid dreams in years.   Social History   Socioeconomic History  . Marital status: Single    Spouse name: Not on file  . Number of children: 0  . Years of education: Not on file  . Highest education level: Bachelor's degree (e.g., BA, AB, BS)  Occupational History  . Occupation: Marine scientist: [employer]  Tobacco Use  . Smoking status: Former Smoker  Types: Cigars    Quit date: 01/01/2018    Years since quitting: 2.3  . Smokeless tobacco: Never Used  . Tobacco comment: Black and Milds  Vaping Use  . Vaping Use: Never used  Substance and Sexual Activity  . Alcohol use: Yes    Alcohol/week: 2.0 standard drinks    Types: 2 Cans of beer per week  . Drug use: No  . Sexual activity: Not Currently  Other Topics Concern  . Not on file  Social History Narrative   Patient is right-handed. She lives in a single level home. Her mother lives with her. She does not exercise.   Caffeine: Kombucha   Social Determinants of Corporate investment bankerHealth   Financial Resource Strain: Not on file  Food Insecurity: Not on file  Transportation Needs: Not on file  Physical Activity: Not on file  Stress: Not on file  Social Connections: Not on file    Family  History  Problem Relation Age of Onset  . Arthritis Mother   . Diabetes Mother   . High Cholesterol Mother   . Migraines Mother   . Cancer Father   . High blood pressure Father   . High Cholesterol Sister   . High blood pressure Sister   . Kidney disease Maternal Grandmother   . Cancer Maternal Grandfather   . Heart attack Maternal Grandfather   . High Cholesterol Paternal Grandmother   . High blood pressure Paternal Grandmother   . High blood pressure Paternal Grandfather     Past Medical History:  Diagnosis Date  . Asthma   . Fibromyalgia   . IBS (irritable bowel syndrome)   . Migraine     Past Surgical History:  Procedure Laterality Date  . [facility]  [facility]     Current Outpatient Medications on File Prior to Visit  Medication Sig Dispense Refill  . AJOVY 225 MG/1.5ML SOAJ INJECT 225 MG INTO THE SKIN EVERY 30 (THIRTY) DAYS. 1.5 mL 11  . albuterol (PROVENTIL) (2.5 MG/3ML) 0.083% nebulizer solution Take 3 mLs (2.5 mg total) by nebulization every 6 (six) hours as needed for wheezing or shortness of breath. 150 mL 1  . albuterol (VENTOLIN HFA) 108 (90 Base) MCG/ACT inhaler TAKE 2 PUFFS BY MOUTH EVERY 6 HOURS AS NEEDED FOR WHEEZE OR SHORTNESS OF BREATH 18 g 3  . Ascorbic Acid (VITAMIN C) 1000 MG tablet Take 1,000 mg by mouth daily.    . baclofen (LIORESAL) 10 MG tablet TAKE 0.5 TABLETS (5 MG TOTAL) BY MOUTH 3 (THREE) TIMES DAILY. 135 tablet 1  . budesonide-formoterol (SYMBICORT) 80-4.5 MCG/ACT inhaler Inhale 2 puffs into the lungs 2 (two) times daily. 1 Inhaler 3  . CHLOROPHYLL PO Take by mouth.    . cholecalciferol (VITAMIN D) 1000 units tablet Take 1,000 Units by mouth daily.    . COD LIVER OIL PO Take by mouth.    . Cyanocobalamin (VITAMIN B-12 PO) Take 1,000 mcg by mouth daily.    Marland Kitchen. dicyclomine (BENTYL) 20 MG tablet Take 1 tablet (20 mg total) by mouth 4 (four) times daily -  before meals and at bedtime. 270 tablet 0  . escitalopram (LEXAPRO) 20 MG tablet TAKE 1 TABLET BY MOUTH  EVERY DAY 90 tablet 3  . meclizine (ANTIVERT) 25 MG tablet TAKE 1 TABLET (25 MG TOTAL) BY MOUTH 2 (TWO) TIMES DAILY AS NEEDED FOR DIZZINESS. 30 tablet 0  . Multiple Vitamin (MULTIVITAMIN) tablet Take 1 tablet by mouth daily.    . Omega-3 Fatty Acids (FISH OIL)  1000 MG CAPS Take by mouth.    Marland Kitchen OVER THE COUNTER MEDICATION Red beet water mix-in    . pantoprazole (PROTONIX) 40 MG tablet TAKE 1 TABLET BY MOUTH EVERY DAY 90 tablet 3  . pyridoxine (B-6) 250 MG tablet Take 250 mg by mouth daily.    . Riboflavin (B-2-400 PO) Take by mouth.     Current Facility-Administered Medications on File Prior to Visit  Medication Dose Route Frequency Provider Last Rate Last Admin  . Fremanezumab-vfrm SOSY 25 mg  25 mg Subcutaneous Once Anson Fret, MD        Allergies  Allergen Reactions  . Asa [Aspirin]   . Latex   . Penicillins   . Vitamin E Hives    Physical exam:  Today's Vitals   05/07/20 1515  BP: 128/72  Pulse: 85  Weight: 182 lb (82.6 kg)  Height: 5\' 7"  (1.702 m)   Body mass index is 28.51 kg/m.   Wt Readings from Last 3 Encounters:  05/07/20 182 lb (82.6 kg)  04/07/20 190 lb (86.2 kg)  01/14/19 181 lb (82.1 kg)     Ht Readings from Last 3 Encounters:  05/07/20 5\' 7"  (1.702 m)  04/07/20 5' 7.5" (1.715 m)  01/14/19 5\' 7"  (1.702 m)      General: The patient is awake, alert and appears not in acute distress.  The patient is well groomed. Head: Normocephalic, atraumatic. Neck is supple. Mallampati: 2,  neck circumference: 14.5  inches . Crowded.   Cardiovascular:  Regular rate and cardiac rhythm by pulse,  without distended neck veins. Respiratory: Lungs are clear to auscultation.  Skin:  Without evidence of ankle edema, or rash. Trunk: The patient's posture is erect.   Neurologic exam : The patient is awake and alert, oriented to place and time.   Memory subjective described as impaired, speech issues, word search.  Attention span & concentration ability appears  normal.  Speech is  Pressured, clipped.   without  dysarthria, dysphonia or aphasia.  Mood and affect are appropriate.   Cranial nerves: no loss of smell or taste reported  Pupils are equal and briskly reactive to light. Funduscopic exam deferred.   Extraocular movements in vertical and horizontal planes were intact and without nystagmus. No Diplopia. Visual fields by finger perimetry are intact. Hearing was intact to soft voice and finger rubbing.    Facial sensation intact to fine touch.  Facial motor strength is symmetric and tongue and uvula move midline.  Neck ROM : rotation, tilt and flexion extension were normal for age and shoulder shrug was symmetrical.    Motor exam:  Symmetric bulk, tone and ROM.   Normal tone without cog wheeling, symmetric grip strength .   Sensory:  Fine touch, pinprick and vibration were tested  and  normal.  Proprioception tested in the upper extremities was normal.   Coordination: Rapid alternating movements in the fingers/hands were of normal speed.  The Finger-to-nose maneuver was intact without evidence of ataxia, dysmetria or tremor.   Gait and station: Patient could rise unassisted from a seated position, walked without assistive device.  Stance is of normal width/ base and the patient turned with 3 steps.  Toe and heel walk were deferred.  Deep tendon reflexes: in the  upper and lower extremities are symmetric and intact.  Babinski response was deferred .     After spending a total time of  20 minutes face to face and additional time for physical and neurologic examination,  review of laboratory studies,  personal review of imaging studies, reports and results of other testing and review of referral information / records as far as provided in visit, I have established the following assessments:  1) not likely narcoleptic, this is excessive daytime  Fatigue and hypersomnia with prolonged sleep time.  2) snoring was never mentioned to her, but  apnea has been - unusual combination.,  3) morning headaches are sometimes present , but no cluster type .    My Plan is to proceed with:  1) HST for excessive daytime sleepiness, prolonged sleep time, no snoring.    I would like to thank Dr Lucia Gaskins,  [name], Katina Degree, Md [address] [place],  [place] [address] for allowing me to meet with and to take care of this pleasant patient.     I plan to follow up either personally or through our NP within 2-4 month- if the HST is positive.    CC: I will share my notes with PCP   Electronically signed by: Melvyn Novas, MD 05/07/2020 3:52 PM  [facility] Board certified by The [employer] of Sleep Medicine and Diplomate of the [employer] of Sleep Medicine. Board certified In Neurology through the ABPN, Fellow of the [employer] of Neurology. Medical Director of [facility].

## 2020-05-08 ENCOUNTER — Ambulatory Visit
Admission: RE | Admit: 2020-05-08 | Discharge: 2020-05-08 | Disposition: A | Discharge status: Home / Self Care | Payer: 59 | Source: Ambulatory Visit | Attending: Neurology | Admitting: Neurology

## 2020-05-08 ENCOUNTER — Other Ambulatory Visit: Payer: Self-pay

## 2020-05-08 DIAGNOSIS — H539 Unspecified visual disturbance: Secondary | ICD-10-CM | POA: Diagnosis not present

## 2020-05-08 DIAGNOSIS — G8929 Other chronic pain: Secondary | ICD-10-CM

## 2020-05-08 DIAGNOSIS — R5383 Other fatigue: Secondary | ICD-10-CM

## 2020-05-08 DIAGNOSIS — R51 Headache with orthostatic component, not elsewhere classified: Secondary | ICD-10-CM

## 2020-05-08 DIAGNOSIS — R519 Headache, unspecified: Secondary | ICD-10-CM | POA: Diagnosis not present

## 2020-05-08 DIAGNOSIS — G4719 Other hypersomnia: Secondary | ICD-10-CM

## 2020-05-21 ENCOUNTER — Encounter: Payer: Self-pay | Admitting: *Deleted

## 2020-05-21 NOTE — Telephone Encounter (Signed)
Appeal sent to plan. Aimovig contraindicated due to latex allergy.

## 2020-05-21 NOTE — Telephone Encounter (Signed)
This request has not been approved. Based on the information submitted for review, you did not meet our guideline rules for the requested drug. In order for your request to be approved, your provider would need to show that you have met the guideline rules below. The details below are written in medical language. If you have questions, please contact your provider. In some cases, the requested medication or alternatives offered may have additional approval requirements. Our guideline named [place] (Ajovy), for a diagnosis of chronic migraines (15 or more headache days per month), requires the following rule be met for approval:- You have previously tried [place] doctor told us that you have a diagnosis of chronic migraines and will be using the requested medication for the preventive treatment of migraines. Trials of amitriptyline, Emgality, Lexapro, and sumatriptan are noted. However, we do not have information showing you have previously tried Aimovig. This is why your request is denied. Please work with your doctor to use a different medication.Please note: Aimovig may require a prior authorization. A written notification letter will follow with additional details.

## 2020-05-25 NOTE — Telephone Encounter (Signed)
This request has been approved. The authorization is effective from [id number] to [id number] , as long as the member is enrolled in their current health plan.

## 2020-06-08 ENCOUNTER — Telehealth: Payer: Self-pay

## 2020-06-08 NOTE — Telephone Encounter (Signed)
LVM for pt to call me back to schedule sleep study  

## 2020-06-14 ENCOUNTER — Other Ambulatory Visit: Payer: Self-pay | Admitting: Family Medicine

## 2020-06-18 ENCOUNTER — Other Ambulatory Visit: Payer: Self-pay

## 2020-06-18 ENCOUNTER — Ambulatory Visit (INDEPENDENT_AMBULATORY_CARE_PROVIDER_SITE_OTHER): Payer: 59

## 2020-06-18 ENCOUNTER — Encounter: Payer: Self-pay | Admitting: Sports Medicine

## 2020-06-18 ENCOUNTER — Ambulatory Visit (INDEPENDENT_AMBULATORY_CARE_PROVIDER_SITE_OTHER): Payer: 59 | Admitting: Sports Medicine

## 2020-06-18 DIAGNOSIS — M79671 Pain in right foot: Secondary | ICD-10-CM | POA: Diagnosis not present

## 2020-06-18 DIAGNOSIS — M779 Enthesopathy, unspecified: Secondary | ICD-10-CM

## 2020-06-18 DIAGNOSIS — S99921A Unspecified injury of right foot, initial encounter: Secondary | ICD-10-CM | POA: Diagnosis not present

## 2020-06-18 DIAGNOSIS — M7751 Other enthesopathy of right foot: Secondary | ICD-10-CM | POA: Diagnosis not present

## 2020-06-18 NOTE — Patient Instructions (Signed)
Use topical voltaren to your foot for pain twice a day Use your brace every day for the next month then slowly wean from using as directed

## 2020-06-18 NOTE — Progress Notes (Signed)
Subjective: Melissa [name] is a 40 y.o. female patient who presents to office for evaluation of right foot pain. Patient complains of progressive pain especially over the last 1.5 months in the right foot at the medial aspect. Ranks pain that's sharp. Patient has tried rest with no relief in symptoms. Patient denies any other pedal complaints.   Patient Active Problem List   Diagnosis Date Noted  . Chronic migraine without aura, with intractable migraine, so stated, with status migrainosus 04/07/2020  . Hyperglycemia 11/12/2018  . Dyslipidemia 11/12/2018  . Depression, major, single episode, moderate (HCC) 09/22/2017  . Fibromyalgia 09/22/2017  . RHINITIS, ALLERGIC 03/30/2006  . Asthma 03/30/2006  . IRRITABLE BOWEL SYNDROME 03/30/2006    Current Outpatient Medications on File Prior to Visit  Medication Sig Dispense Refill  . AJOVY 225 MG/1.5ML SOAJ INJECT 225 MG INTO THE SKIN EVERY 30 (THIRTY) DAYS. 1.5 mL 11  . albuterol (PROVENTIL) (2.5 MG/3ML) 0.083% nebulizer solution Take 3 mLs (2.5 mg total) by nebulization every 6 (six) hours as needed for wheezing or shortness of breath. 150 mL 1  . albuterol (VENTOLIN HFA) 108 (90 Base) MCG/ACT inhaler TAKE 2 PUFFS BY MOUTH EVERY 6 HOURS AS NEEDED FOR WHEEZE OR SHORTNESS OF BREATH 18 g 3  . amitriptyline (ELAVIL) 10 MG tablet TAKE 1 TABLET BY MOUTH EVERYDAY AT BEDTIME 90 tablet 0  . Ascorbic Acid (VITAMIN C) 1000 MG tablet Take 1,000 mg by mouth daily.    . baclofen (LIORESAL) 10 MG tablet TAKE 0.5 TABLETS (5 MG TOTAL) BY MOUTH 3 (THREE) TIMES DAILY. 135 tablet 1  . budesonide-formoterol (SYMBICORT) 80-4.5 MCG/ACT inhaler Inhale 2 puffs into the lungs 2 (two) times daily. 1 Inhaler 3  . CHLOROPHYLL PO Take by mouth.    . cholecalciferol (VITAMIN D) 1000 units tablet Take 1,000 Units by mouth daily.    . COD LIVER OIL PO Take by mouth.    . Cyanocobalamin (VITAMIN B-12 PO) Take 1,000 mcg by mouth daily.    Marland Kitchen dicyclomine (BENTYL) 20 MG tablet Take 1  tablet (20 mg total) by mouth 4 (four) times daily -  before meals and at bedtime. 270 tablet 0  . escitalopram (LEXAPRO) 20 MG tablet TAKE 1 TABLET BY MOUTH EVERY DAY 90 tablet 3  . meclizine (ANTIVERT) 25 MG tablet TAKE 1 TABLET (25 MG TOTAL) BY MOUTH 2 (TWO) TIMES DAILY AS NEEDED FOR DIZZINESS. 30 tablet 0  . Multiple Vitamin (MULTIVITAMIN) tablet Take 1 tablet by mouth daily.    . Omega-3 Fatty Acids (FISH OIL) 1000 MG CAPS Take by mouth.    Marland Kitchen OVER THE COUNTER MEDICATION Red beet water mix-in    . pantoprazole (PROTONIX) 40 MG tablet TAKE 1 TABLET BY MOUTH EVERY DAY 90 tablet 3  . pyridoxine (B-6) 250 MG tablet Take 250 mg by mouth daily.    . Riboflavin (B-2-400 PO) Take by mouth.     Current Facility-Administered Medications on File Prior to Visit  Medication Dose Route Frequency Provider Last Rate Last Admin  . Fremanezumab-vfrm SOSY 25 mg  25 mg Subcutaneous Once Anson Fret, MD        Allergies  Allergen Reactions  . Asa [Aspirin]   . Latex   . Penicillins   . Vitamin E Hives    Objective:  General: Alert and oriented x3 in no acute distress  Dermatology: No open lesions bilateral lower extremities, no webspace macerations, no ecchymosis bilateral, all nails x 10 are well manicured.  Vascular: Dorsalis  Pedis and Posterior Tibial pedal pulses palpable, Capillary Fill Time 3 seconds,(+) pedal hair growth bilateral, no edema bilateral lower extremities, Temperature gradient within normal limits.  Neurology: Gross sensation intact via light touch bilateral.   Musculoskeletal: Mild tenderness with palpation at dorsal forefoot to ankle over extensor tendons on right, strength within normal limits in all groups bilateral.   Gait: Antalgic gait  Xrays  Right Foot   Impression: No acute osseous findings   Assessment and Plan: Problem List Items Addressed This Visit   None   Visit Diagnoses    Foot injury, right, initial encounter    -  Primary   Relevant Orders    DG Foot Complete Right   Right foot pain       Tendonitis           -Complete examination performed -Xrays reviewed -Discussed treatment options -Rx Anklet ganulet for right foot  -Recommend to use topical voltaren to use for pain as directed -Patient to return to office as needed or sooner if condition worsens.  Asencion Islam, DPM

## 2020-06-19 ENCOUNTER — Other Ambulatory Visit: Payer: Self-pay | Admitting: Sports Medicine

## 2020-06-19 DIAGNOSIS — M7751 Other enthesopathy of right foot: Secondary | ICD-10-CM

## 2020-06-22 ENCOUNTER — Telehealth: Payer: Self-pay

## 2020-06-22 NOTE — Telephone Encounter (Signed)
LVM for pt to call me back to schedule sleep study  

## 2020-06-26 ENCOUNTER — Other Ambulatory Visit: Payer: Self-pay

## 2020-06-26 ENCOUNTER — Ambulatory Visit
Admission: RE | Admit: 2020-06-26 | Discharge: 2020-06-26 | Disposition: A | Discharge status: Home / Self Care | Payer: 59 | Source: Ambulatory Visit | Attending: Family Medicine | Admitting: Family Medicine

## 2020-06-26 ENCOUNTER — Ambulatory Visit: Payer: BLUE CROSS/BLUE SHIELD

## 2020-06-26 DIAGNOSIS — Z1231 Encounter for screening mammogram for malignant neoplasm of breast: Secondary | ICD-10-CM

## 2020-06-30 ENCOUNTER — Ambulatory Visit (INDEPENDENT_AMBULATORY_CARE_PROVIDER_SITE_OTHER): Payer: 59 | Admitting: Neurology

## 2020-06-30 ENCOUNTER — Other Ambulatory Visit: Payer: Self-pay | Admitting: Family Medicine

## 2020-06-30 DIAGNOSIS — R5382 Chronic fatigue, unspecified: Secondary | ICD-10-CM

## 2020-06-30 DIAGNOSIS — G4719 Other hypersomnia: Secondary | ICD-10-CM

## 2020-06-30 DIAGNOSIS — G4733 Obstructive sleep apnea (adult) (pediatric): Secondary | ICD-10-CM | POA: Diagnosis not present

## 2020-06-30 DIAGNOSIS — G43911 Migraine, unspecified, intractable, with status migrainosus: Secondary | ICD-10-CM

## 2020-06-30 DIAGNOSIS — N644 Mastodynia: Secondary | ICD-10-CM

## 2020-06-30 DIAGNOSIS — G4711 Idiopathic hypersomnia with long sleep time: Secondary | ICD-10-CM

## 2020-07-01 ENCOUNTER — Other Ambulatory Visit: Payer: Self-pay | Admitting: *Deleted

## 2020-07-01 ENCOUNTER — Other Ambulatory Visit: Payer: Self-pay | Admitting: Family Medicine

## 2020-07-01 ENCOUNTER — Telehealth: Payer: Self-pay

## 2020-07-01 NOTE — Telephone Encounter (Signed)
Patient has appointment on 07/10/2019 Diagnostic Mammogram Will be order at appointment time

## 2020-07-01 NOTE — Telephone Encounter (Signed)
Patient called in and wanted to see if Dr. [name] can put in a diagnostic mammogram. Patient would like a call back.

## 2020-07-01 NOTE — Telephone Encounter (Signed)
Ok with me. Please place any necessary orders. 

## 2020-07-01 NOTE — Telephone Encounter (Signed)
Please advise 

## 2020-07-02 NOTE — Progress Notes (Signed)
[facility] SLEEP TEST (by Watch PAT) REPORT  STUDY DATE: 07-01-20  DOB: [birth year]  MRN: [id number]  ORDERING CLINICIAN: Melvyn Novas, MD   REFERRING CLINICIAN: Naomie Dean, MD   Melissa Dark, MD   CLINICAL INFORMATION/HISTORY: Melissa [name] a 40 y.o. year old African American female patientseen  upon referralby Dr Melissa [name] on 05/07/2020 Chiefconcernaccording to patient :   Presents today to assess OSA -concern.  States that her ex BF and Mother have witnessed apnea events. She has never been told if she snores. She averages more than 10 hours of sleep and states that she feels sound asleep but wakes up tired. She endorsed excessive daytime sleepiness.  Melissa [name]  has a medical history of Asthma,IBS (irritable bowel syndrome), and Migraine.(migraines began in her thirties, getting more frequent).  Epworth sleepiness score: 15/24.  BMI: 27.7 kg/m  Neck Circumference: 14.5 "  FINDINGS:  Sleep Summary:  Total Recording Time (hours, min): 5 h 59 min Total Sleep Time (hours, min):  4 h 24 min  Percent REM (%):    10.21%   Respiratory Indices:  Calculated pAHI (per hour): 30.0/hour        REM pAHI: N/A      NREM pAHI: N/A  Oxygen Saturation Statistics:    Oxygen Saturation (%) Mean: 96%  Minimum oxygen saturation (%):  89%  O2 Saturation Range (%): 89-100%   O2 Saturation (minutes) <89%:0 min  Pulse Rate Statistics:  Pulse Mean (bpm): 75/min   Pulse Range (63-116/min)   IMPRESSION: This HST indicated severe OSA (obstructive sleep apnea) , not associated with hypoxemia. There was not enough sleep in REM stage recorded to allow differentiation of AHI by REM/ NREM sleep stages. Overall , this HST only recorded 4.5 hours of sleep.    RECOMMENDATION:  This degree of OSA will need to be treated, either by CPAP or , PLAN B, by dental device. In a patient with possibly sleep related headaches, CPAP auto- titration would be there preferred way  of therapy.  Auto CPAP 5-15 cm water, 2 cm EPR and mask of choice ( consider retrognathia)  , heated humidification.   INTERPRETING PHYSICIAN:  Melissa Novas, MD    [facility] Sleep Board certified by The [employer] of Sleep Medicine and Diplomate of the [employer] of Sleep Medicine. Board certified In Neurology through the ABPN, Fellow of the [employer] of Neurology. Medical Director of [facility].  Sleep Summary    Start Study Time: End Study Time: Total Recording Time:    12:21:54 AM 6:21:16 AM 5 h, 59 min  Total Sleep Time % REM of Sleep Time:  4 h, 24 min  10.2    Mean: 96 Minimum: 89 Maximum: 100  Mean of Desaturations Nadirs (%):   93  Oxygen Desatur. %: 4-9 10-20 >20 Total  Events Number Total  48 100.0  0 0.0  0 0.0  48 100.0  Oxygen Saturation: <90 <=88 <85 <80 <70  Duration (minutes): Sleep % 0.1 0.0 0.0 0.0 0.0 0.0 0.0 0.0 0.0 0.0     Respiratory Indices      Total Events REM NREM All Night  pRDI: pAHI 3%: ODI 4%: pAHIc 3%: % CSR: pAHI 4%:  122 112   48  16 0.0 58 N/A N/A N/A N/A N/A N/A N/A N/A 32.6 30.0 12.8 4.3  15.5       Pulse Rate Statistics during Sleep (BPM)  Mean: 75 Minimum: 63 Maximum: 116         Body Position Statistics  Position Supine Prone Right Left Non-Supine  Sleep (min) 110.0 72.5 71.5 10.4 154.4  Sleep % 41.6 27.4 27.0 3.9 58.4  pRDI 33.9 26.9 38.4 N/A 31.9  pAHI 3% 33.1 22.6 34.7 N/A 28.1  ODI 4% 12.3 8.7 18.7 N/A 13.2     Snoring Statistics Snoring Level (dB) >40 >50 >60 >70 >80 >Threshold (45)  Sleep (min) 41.9 7.6 2.4 0.0 0.0 14.5  Sleep % 15.8 2.9 0.9 0.0 0.0 5.5    Mean: 44 dB

## 2020-07-05 ENCOUNTER — Other Ambulatory Visit: Payer: Self-pay | Admitting: Family Medicine

## 2020-07-07 DIAGNOSIS — G4719 Other hypersomnia: Secondary | ICD-10-CM | POA: Insufficient documentation

## 2020-07-07 DIAGNOSIS — G4733 Obstructive sleep apnea (adult) (pediatric): Secondary | ICD-10-CM | POA: Insufficient documentation

## 2020-07-07 DIAGNOSIS — G4711 Idiopathic hypersomnia with long sleep time: Secondary | ICD-10-CM | POA: Insufficient documentation

## 2020-07-07 DIAGNOSIS — R5382 Chronic fatigue, unspecified: Secondary | ICD-10-CM | POA: Insufficient documentation

## 2020-07-07 NOTE — Procedures (Signed)
[facility] SLEEP TEST (by Watch PAT) REPORT  STUDY DATE: 07-01-20  DOB: 1980-07-23  MRN: [id number]  ORDERING CLINICIAN: Melvyn Novas, MD   REFERRING CLINICIAN: Naomie Dean, MD   Ardith Dark, MD   CLINICAL INFORMATION/HISTORY: Melissa [name] a 40 y.o. year old African American female patientseen  upon referralby Dr Lucia Gaskins on 05/07/2020 Chiefconcernaccording to patient :   Presents today to assess OSA -concern.  States that her ex BF and Mother have witnessed apnea events. She has never been told if she snores. She averages more than 10 hours of sleep and states that she feels sound asleep but wakes up tired. She endorsed excessive daytime sleepiness.  Melissa [name]  has a medical history of Asthma,IBS (irritable bowel syndrome), and Migraine.(migraines began in her thirties, getting more frequent).  Epworth sleepiness score: 15/24.  BMI: 27.7 kg/m  Neck Circumference: 14.5 "  FINDINGS:  Sleep Summary:  Total Recording Time (hours, min): 5 h 59 min Total Sleep Time (hours, min):  4 h 24 min  Percent REM (%):    10.21%   Respiratory Indices:  Calculated pAHI (per hour): 30.0/hour        REM pAHI: N/A      NREM pAHI: N/A  Oxygen Saturation Statistics:    Oxygen Saturation (%) Mean: 96%  Minimum oxygen saturation (%):  89%  O2 Saturation Range (%): 89-100%   O2 Saturation (minutes) <89%:0 min  Pulse Rate Statistics:  Pulse Mean (bpm): 75/min   Pulse Range (63-116/min)   IMPRESSION: This HST indicated severe OSA (obstructive sleep apnea) , not associated with hypoxemia. There was not enough sleep in REM stage recorded to allow differentiation of AHI by REM/ NREM sleep stages. Overall , this HST only recorded 4.5 hours of sleep.    RECOMMENDATION:  This degree of OSA will need to be treated, either by CPAP or , PLAN B, by dental device. In a patient with possibly sleep related headaches, CPAP auto- titration would be there preferred way of  therapy.  Auto CPAP 5-15 cm water, 2 cm EPR and mask of choice ( consider retrognathia)  , heated humidification.   INTERPRETING PHYSICIAN:  Melvyn Novas, MD    [facility] and [facility] Sleep Board certified by The [employer] of Sleep Medicine and Diplomate of the [employer] of Sleep Medicine. Board certified In Neurology through the ABPN, Fellow of the [employer] of Neurology. Medical Director of [employer].  Sleep Summary    Start Study Time: End Study Time: Total Recording Time:    12:21:54 AM 6:21:16 AM 5 h, 59 min  Total Sleep Time % REM of Sleep Time:  4 h, 24 min  10.2    Mean: 96 Minimum: 89 Maximum: 100  Mean of Desaturations Nadirs (%):   93  Oxygen Desatur. %: 4-9 10-20 >20 Total  Events Number Total  48 100.0  0 0.0  0 0.0  48 100.0  Oxygen Saturation: <90 <=88 <85 <80 <70  Duration (minutes): Sleep % 0.1 0.0 0.0 0.0 0.0 0.0 0.0 0.0 0.0 0.0     Respiratory Indices      Total Events REM NREM All Night  pRDI: pAHI 3%: ODI 4%: pAHIc 3%: % CSR: pAHI 4%:  122 112   48  16 0.0 58 N/A N/A N/A N/A N/A N/A N/A N/A 32.6 30.0 12.8 4.3  15.5       Pulse Rate Statistics during Sleep (BPM)  Mean: 75 Minimum: 63 Maximum: 116         Body Position Statistics  Position Supine Prone Right Left Non-Supine  Sleep (min) 110.0 72.5 71.5 10.4 154.4  Sleep % 41.6 27.4 27.0 3.9 58.4  pRDI 33.9 26.9 38.4 N/A 31.9  pAHI 3% 33.1 22.6 34.7 N/A 28.1  ODI 4% 12.3 8.7 18.7 N/A 13.2     Snoring Statistics Snoring Level (dB) >40 >50 >60 >70 >80 >Threshold (45)  Sleep (min) 41.9 7.6 2.4 0.0 0.0 14.5  Sleep % 15.8 2.9 0.9 0.0 0.0 5.5

## 2020-07-07 NOTE — Addendum Note (Signed)
Addended by: Melvyn Novas on: 07/07/2020 05:48 PM   Modules accepted: Orders

## 2020-07-07 NOTE — Progress Notes (Signed)
IMPRESSION: This HST indicated severe OSA (obstructive sleep apnea) , not associated with hypoxemia. There was not enough sleep in REM stage recorded to allow differentiation of AHI by REM/ NREM sleep stages. Overall , this HST only recorded 4.5 hours of sleep.   RECOMMENDATION:  This degree of OSA will need to be treated, either by CPAP or , PLAN B, by dental device. In a patient with possibly sleep related headaches, CPAP auto- titration would be there preferred way of therapy.  Auto CPAP 5-15 cm water, 2 cm EPR and mask of choice ( consider retrognathia)  , heated humidification.   INTERPRETING PHYSICIAN:  Melvyn Novas, MD

## 2020-07-09 ENCOUNTER — Telehealth: Payer: Self-pay

## 2020-07-09 ENCOUNTER — Ambulatory Visit (INDEPENDENT_AMBULATORY_CARE_PROVIDER_SITE_OTHER): Payer: 59 | Admitting: Family Medicine

## 2020-07-09 ENCOUNTER — Encounter: Payer: Self-pay | Admitting: Family Medicine

## 2020-07-09 ENCOUNTER — Other Ambulatory Visit: Payer: Self-pay

## 2020-07-09 VITALS — BP 132/93 | HR 80 | Temp 98.2°F | Ht 67.0 in | Wt 185.4 lb

## 2020-07-09 DIAGNOSIS — Z0001 Encounter for general adult medical examination with abnormal findings: Secondary | ICD-10-CM | POA: Diagnosis not present

## 2020-07-09 DIAGNOSIS — E538 Deficiency of other specified B group vitamins: Secondary | ICD-10-CM

## 2020-07-09 DIAGNOSIS — M797 Fibromyalgia: Secondary | ICD-10-CM

## 2020-07-09 DIAGNOSIS — E785 Hyperlipidemia, unspecified: Secondary | ICD-10-CM | POA: Diagnosis not present

## 2020-07-09 DIAGNOSIS — G4733 Obstructive sleep apnea (adult) (pediatric): Secondary | ICD-10-CM

## 2020-07-09 DIAGNOSIS — N644 Mastodynia: Secondary | ICD-10-CM

## 2020-07-09 DIAGNOSIS — Z6829 Body mass index (BMI) 29.0-29.9, adult: Secondary | ICD-10-CM

## 2020-07-09 DIAGNOSIS — F321 Major depressive disorder, single episode, moderate: Secondary | ICD-10-CM

## 2020-07-09 DIAGNOSIS — R739 Hyperglycemia, unspecified: Secondary | ICD-10-CM | POA: Diagnosis not present

## 2020-07-09 DIAGNOSIS — G43811 Other migraine, intractable, with status migrainosus: Secondary | ICD-10-CM

## 2020-07-09 DIAGNOSIS — K589 Irritable bowel syndrome without diarrhea: Secondary | ICD-10-CM

## 2020-07-09 DIAGNOSIS — E663 Overweight: Secondary | ICD-10-CM

## 2020-07-09 LAB — COMPREHENSIVE METABOLIC PANEL
ALT: 54 U/L — ABNORMAL HIGH (ref 0–35)
AST: 37 U/L (ref 0–37)
Albumin: 4.5 g/dL (ref 3.5–5.2)
Alkaline Phosphatase: 41 U/L (ref 39–117)
BUN: 8 mg/dL (ref 6–23)
CO2: 25 mEq/L (ref 19–32)
Calcium: 9.5 mg/dL (ref 8.4–10.5)
Chloride: 101 mEq/L (ref 96–112)
Creatinine, Ser: 0.77 mg/dL (ref 0.40–1.20)
GFR: 95.78 mL/min (ref >60.00)
Glucose, Bld: 106 mg/dL — ABNORMAL HIGH (ref 70–99)
Potassium: 4.1 mEq/L (ref 3.5–5.1)
Sodium: 136 mEq/L (ref 135–145)
Total Bilirubin: 0.6 mg/dL (ref 0.2–1.2)
Total Protein: 8 g/dL (ref 6.0–8.3)

## 2020-07-09 LAB — VITAMIN B12: Vitamin B-12: >1550 pg/mL — ABNORMAL HIGH (ref 211–911)

## 2020-07-09 LAB — CBC
HCT: 40.3 % (ref 36.0–46.0)
Hemoglobin: 13.7 g/dL (ref 12.0–15.0)
MCHC: 33.9 g/dL (ref 30.0–36.0)
MCV: 87.3 fl (ref 78.0–100.0)
Platelets: 173 10*3/uL (ref 150.0–400.0)
RBC: 4.62 Mil/uL (ref 3.87–5.11)
RDW: 13 % (ref 11.5–15.5)
WBC: 5.8 10*3/uL (ref 4.0–10.5)

## 2020-07-09 LAB — LIPID PANEL
Cholesterol: 245 mg/dL — ABNORMAL HIGH (ref 0–200)
HDL: 52.3 mg/dL (ref >39.00)
NonHDL: 192.69
Total CHOL/HDL Ratio: 5
Triglycerides: 211 mg/dL — ABNORMAL HIGH (ref 0.0–149.0)
VLDL: 42.2 mg/dL — ABNORMAL HIGH (ref 0.0–40.0)

## 2020-07-09 LAB — HEMOGLOBIN A1C: Hgb A1c MFr Bld: 6.2 % (ref 4.6–6.5)

## 2020-07-09 LAB — TSH: TSH: 1.19 u[IU]/mL (ref 0.35–4.50)

## 2020-07-09 LAB — LDL CHOLESTEROL, DIRECT: Direct LDL: 164 mg/dL

## 2020-07-09 MED ORDER — MECLIZINE HCL 25 MG PO TABS: 25.0000 mg | ORAL_TABLET | Freq: Two times a day (BID) | ORAL | 5 refills | Status: DC | PRN

## 2020-07-09 MED ORDER — DICYCLOMINE HCL 20 MG PO TABS: 20.0000 mg | ORAL_TABLET | Freq: Three times a day (TID) | ORAL | 5 refills | Status: DC

## 2020-07-09 NOTE — Assessment & Plan Note (Signed)
check labs.  Discussed lifestyle modifications.

## 2020-07-09 NOTE — Assessment & Plan Note (Signed)
Still has quite a bit of depressive symptoms.  She does not want to change meds at this point.  We will continue Lexapro 20 mg daily.  Hopefully will have some improvement in mood with treatment of her OSA.

## 2020-07-09 NOTE — Telephone Encounter (Signed)
I called pt. No answer, left a message asking pt to call me back.   

## 2020-07-09 NOTE — Progress Notes (Signed)
Chaperoned Dr. [name] while doing a breast exam in office today.

## 2020-07-09 NOTE — Assessment & Plan Note (Signed)
Continue Bentyl 20 mg 3 times daily before meals and at bedtime.  Will refill today.

## 2020-07-09 NOTE — Patient Instructions (Signed)
It was very nice to see you today!  We will check blood work today.  I will refill your medications.  Please continue work on diet and exercise.  We need to get a mammogram.  I will see you back in a year for your next physical.  Come back sooner if needed.   Take care, Dr Jerline Pain  PLEASE NOTE:  If you had any lab tests please let us know if you have not heard back within a few days. You may see your results on mychart before we have a chance to review them but we will give you a call once they are reviewed by [place]. If we ordered any referrals today, please let us know if you have not heard from their office within the next week.   Please try these tips to maintain a healthy lifestyle:  Eat at least 3 REAL meals and 1-2 snacks per day.  Aim for no more than 5 hours between eating.  If you eat breakfast, please do so within one hour of getting up.   Each meal should contain half fruits/vegetables, one quarter protein, and one quarter carbs (no bigger than a computer mouse)  Cut down on sweet beverages. This includes juice, soda, and sweet tea.   Drink at least 1 glass of water with each meal and aim for at least 8 glasses per day  Exercise at least 150 minutes every week.    Preventive Care 39-2 Years Old, Female Preventive care refers to lifestyle choices and visits with your health care provider that can promote health and wellness. This includes: A yearly physical exam. This is also called an annual wellness visit. Regular dental and eye exams. Immunizations. Screening for certain conditions. Healthy lifestyle choices, such as: Eating a healthy diet. Getting regular exercise. Not using drugs or products that contain nicotine and tobacco. Limiting alcohol use. What can I expect for my preventive care visit? Physical exam Your health care provider will check your: Height and weight. These may be used to calculate your BMI (body mass index). BMI is a measurement that tells if  you are at a healthy weight. Heart rate and blood pressure. Body temperature. Skin for abnormal spots. Counseling Your health care provider may ask you questions about your: Past medical problems. Family's medical history. Alcohol, tobacco, and drug use. Emotional well-being. Home life and relationship well-being. Sexual activity. Diet, exercise, and sleep habits. Work and work Statistician. Access to firearms. Method of birth control. Menstrual cycle. Pregnancy history. What immunizations do I need? Vaccines are usually given at various ages, according to a schedule. Your health care provider will recommend vaccines for you based on your age, medical history, and lifestyle or other factors, such as travel or where you work.   What tests do I need? Blood tests Lipid and cholesterol levels. These may be checked every 5 years, or more often if you are over 36 years old. Hepatitis C test. Hepatitis B test. Screening Lung cancer screening. You may have this screening every year starting at age 40 if you have a 30-pack-year history of smoking and currently smoke or have quit within the past 15 years. Colorectal cancer screening. All adults should have this screening starting at age 90 and continuing until age 79. Your health care provider may recommend screening at age 78 if you are at increased risk. You will have tests every 1-10 years, depending on your results and the type of screening test. Diabetes screening. This is done by  checking your blood sugar (glucose) after you have not eaten for a while (fasting). You may have this done every 1-3 years. Mammogram. This may be done every 1-2 years. Talk with your health care provider about when you should start having regular mammograms. This may depend on whether you have a family history of breast cancer. BRCA-related cancer screening. This may be done if you have a family history of breast, ovarian, tubal, or peritoneal  cancers. Pelvic exam and Pap test. This may be done every 3 years starting at age 43. Starting at age 80, this may be done every 5 years if you have a Pap test in combination with an HPV test. Other tests STD (sexually transmitted disease) testing, if you are at risk. Bone density scan. This is done to screen for osteoporosis. You may have this scan if you are at high risk for osteoporosis. Talk with your health care provider about your test results, treatment options, and if necessary, the need for more tests. Follow these instructions at home: Eating and drinking Eat a diet that includes fresh fruits and vegetables, whole grains, lean protein, and low-fat dairy products. Take vitamin and mineral supplements as recommended by your health care provider. Do not drink alcohol if: Your health care provider tells you not to drink. You are pregnant, may be pregnant, or are planning to become pregnant. If you drink alcohol: Limit how much you have to 0-1 drink a day. Be aware of how much alcohol is in your drink. In the [place] one drink equals one 12 oz bottle of beer (355 mL), one 5 oz glass of wine (148 mL), or one 1 oz glass of hard liquor (44 mL).   Lifestyle Take daily care of your teeth and gums. Brush your teeth every morning and night with fluoride toothpaste. Floss one time each day. Stay active. Exercise for at least 30 minutes 5 or more days each week. Do not use any products that contain nicotine or tobacco, such as cigarettes, e-cigarettes, and chewing tobacco. If you need help quitting, ask your health care provider. Do not use drugs. If you are sexually active, practice safe sex. Use a condom or other form of protection to prevent STIs (sexually transmitted infections). If you do not wish to become pregnant, use a form of birth control. If you plan to become pregnant, see your health care provider for a prepregnancy visit. If told by your health care provider, take low-dose aspirin  daily starting at age 2. Find healthy ways to cope with stress, such as: Meditation, yoga, or listening to music. Journaling. Talking to a trusted person. Spending time with friends and family. Safety Always wear your seat belt while driving or riding in a vehicle. Do not drive: If you have been drinking alcohol. Do not ride with someone who has been drinking. When you are tired or distracted. While texting. Wear a helmet and other protective equipment during sports activities. If you have firearms in your house, make sure you follow all gun safety procedures. What's next? Visit your health care provider once a year for an annual wellness visit. Ask your health care provider how often you should have your eyes and teeth checked. Stay up to date on all vaccines. This information is not intended to replace advice given to you by your health care provider. Make sure you discuss any questions you have with your health care provider. Document Revised: 10/22/2019 Document Reviewed: 09/28/2017 Elsevier Patient Education  2021 [employer].

## 2020-07-09 NOTE — Assessment & Plan Note (Signed)
Recently diagnosed severe OSA.  Following with sleep medicine.

## 2020-07-09 NOTE — Progress Notes (Signed)
Error

## 2020-07-09 NOTE — Progress Notes (Signed)
Chief Complaint:  Melissa [name] is a 40 y.o. female who presents today for her annual comprehensive physical exam.    Assessment/Plan:  New/Acute Problems: Right breast pain Will be getting diagnostic mammogram soon.  Chronic Problems Addressed Today: Intractable migraine with status migrainosus Follows with neurology.  Is on Ajovy.  Dyslipidemia  check labs.  Discussed lifestyle modifications.  Hyperglycemia Check A1c.  Fibromyalgia Pain is still not fully controlled.  She does not want to do any narcotic medication at this point.  We will continue amitriptyline 10 mg nightly.  She was recently found to have severe sleep apnea which could be contributing.  At some point in the future may consider trial of Lyrica.  Depression, major, single episode, moderate (HCC) Still has quite a bit of depressive symptoms.  She does not want to change meds at this point.  We will continue Lexapro 20 mg daily.  Hopefully will have some improvement in mood with treatment of her OSA.  IRRITABLE BOWEL SYNDROME Continue Bentyl 20 mg 3 times daily before meals and at bedtime.  Will refill today.  OSA (obstructive sleep apnea) Recently diagnosed severe OSA.  Following with sleep medicine.   Body mass index is 29.04 kg/m. / Overweight  BMI Metric Follow Up - 07/09/20 1200       BMI Metric Follow Up-Please document annually   BMI Metric Follow Up Education provided              Preventative Healthcare: Check labs.  Up-to-date on Pap.  Due for mammogram.  Patient Counseling(The following topics were reviewed and/or handout was given):  -Nutrition: Stressed importance of moderation in sodium/caffeine intake, saturated fat and cholesterol, caloric balance, sufficient intake of fresh fruits, vegetables, and fiber.  -Stressed the importance of regular exercise.   -Substance Abuse: Discussed cessation/primary prevention of tobacco, alcohol, or other drug use; driving or other dangerous  activities under the influence; availability of treatment for abuse.   -Injury prevention: Discussed safety belts, safety helmets, smoke detector, smoking near bedding or upholstery.   -Sexuality: Discussed sexually transmitted diseases, partner selection, use of condoms, avoidance of unintended pregnancy and contraceptive alternatives.   -Dental health: Discussed importance of regular tooth brushing, flossing, and dental visits.  -Health maintenance and immunizations reviewed. Please refer to Health maintenance section.  Return to care in 1 year for next preventative visit.     Subjective:  HPI:  She has no acute complaints today.   Lifestyle Diet: Balanced.  Exercise: Likes walking.   Depression screen PHQ 2/9 11/09/2018  Decreased Interest 0  Down, Depressed, Hopeless 2  PHQ - 2 Score 2  Altered sleeping 3  Tired, decreased energy 3  Change in appetite 0  Feeling bad or failure about yourself  0  Trouble concentrating 0  Moving slowly or fidgety/restless 3  Suicidal thoughts 0  PHQ-9 Score 11    Health Maintenance Due  Topic Date Due   COVID-19 Vaccine (1) Never done   Hepatitis C Screening  Never done     ROS: Per HPI, otherwise a complete review of systems was negative.   PMH:  The following were reviewed and entered/updated in epic: Past Medical History:  Diagnosis Date   Asthma    Fibromyalgia    IBS (irritable bowel syndrome)    Migraine    Patient Active Problem List   Diagnosis Date Noted   Hypersomnia with long sleep time, idiopathic 07/07/2020   Chronic fatigue 07/07/2020   OSA (obstructive sleep apnea)  07/07/2020   Intractable migraine with status migrainosus 04/07/2020   Hyperglycemia 11/12/2018   Dyslipidemia 11/12/2018   Depression, major, single episode, moderate (HCC) 09/22/2017   Fibromyalgia 09/22/2017   RHINITIS, ALLERGIC 03/30/2006   Asthma 03/30/2006   IRRITABLE BOWEL SYNDROME 03/30/2006   Past Surgical History:  Procedure  Laterality Date   [facility]  2008    Family History  Problem Relation Age of Onset   Arthritis Mother    Diabetes Mother    High Cholesterol Mother    Migraines Mother    Cancer Father    High blood pressure Father    High Cholesterol Sister    High blood pressure Sister    Kidney disease Maternal Grandmother    Cancer Maternal Grandfather    Heart attack Maternal Grandfather    High Cholesterol Paternal Grandmother    High blood pressure Paternal Grandmother    High blood pressure Paternal Grandfather     Medications- reviewed and updated Current Outpatient Medications  Medication Sig Dispense Refill   AJOVY 225 MG/1.5ML SOAJ INJECT 225 MG INTO THE SKIN EVERY 30 (THIRTY) DAYS. 1.5 mL 11   albuterol (PROVENTIL) (2.5 MG/3ML) 0.083% nebulizer solution Take 3 mLs (2.5 mg total) by nebulization every 6 (six) hours as needed for wheezing or shortness of breath. 150 mL 1   albuterol (VENTOLIN HFA) 108 (90 Base) MCG/ACT inhaler TAKE 2 PUFFS BY MOUTH EVERY 6 HOURS AS NEEDED FOR WHEEZE OR SHORTNESS OF BREATH 18 each 3   amitriptyline (ELAVIL) 10 MG tablet TAKE 1 TABLET BY MOUTH EVERYDAY AT BEDTIME 90 tablet 0   Ascorbic Acid (VITAMIN C) 1000 MG tablet Take 1,000 mg by mouth daily.     budesonide-formoterol (SYMBICORT) 80-4.5 MCG/ACT inhaler Inhale 2 puffs into the lungs 2 (two) times daily. 1 Inhaler 3   CHLOROPHYLL PO Take by mouth.     cholecalciferol (VITAMIN D) 1000 units tablet Take 1,000 Units by mouth daily.     COD LIVER OIL PO Take by mouth.     Cyanocobalamin (VITAMIN B-12 PO) Take 1,000 mcg by mouth daily.     escitalopram (LEXAPRO) 20 MG tablet TAKE 1 TABLET BY MOUTH EVERY DAY 90 tablet 3   Multiple Vitamin (MULTIVITAMIN) tablet Take 1 tablet by mouth daily.     Omega-3 Fatty Acids (FISH OIL) 1000 MG CAPS Take by mouth.     OVER THE COUNTER MEDICATION Red beet water mix-in     pantoprazole (PROTONIX) 40 MG tablet TAKE 1 TABLET BY MOUTH EVERY DAY 90 tablet 3   Riboflavin (B-2-400  PO) Take by mouth.     dicyclomine (BENTYL) 20 MG tablet Take 1 tablet (20 mg total) by mouth 4 (four) times daily -  before meals and at bedtime. 270 tablet 5   meclizine (ANTIVERT) 25 MG tablet Take 1 tablet (25 mg total) by mouth 2 (two) times daily as needed for dizziness. 30 tablet 5   pyridoxine (B-6) 250 MG tablet Take 250 mg by mouth daily.     Current Facility-Administered Medications  Medication Dose Route Frequency Provider Last Rate Last Admin   Fremanezumab-vfrm SOSY 25 mg  25 mg Subcutaneous Once Anson Fret, MD        Allergies-reviewed and updated Allergies  Allergen Reactions   Asa [Aspirin]    Latex    Penicillins    Vitamin E Hives    Social History   Socioeconomic History   Marital status: Single    Spouse name: Not on file  Number of children: 0   Years of education: Not on file   Highest education level: Bachelor's degree (e.g., BA, AB, BS)  Occupational History   Occupation: Marine scientist: [employer]  Tobacco Use   Smoking status: Former    Pack years: 0.00    Types: Cigars    Quit date: 01/01/2018    Years since quitting: 2.5   Smokeless tobacco: Never   Tobacco comments:    Black and Milds  Vaping Use   Vaping Use: Never used  Substance and Sexual Activity   Alcohol use: Yes    Alcohol/week: 2.0 standard drinks    Types: 2 Cans of beer per week   Drug use: No   Sexual activity: Not Currently  Other Topics Concern   Not on file  Social History Narrative   Patient is right-handed. She lives in a single level home. Her mother lives with her. She does not exercise.   Caffeine: Kombucha   Social Determinants of Corporate investment banker Strain: Not on file  Food Insecurity: Not on file  Transportation Needs: Not on file  Physical Activity: Not on file  Stress: Not on file  Social Connections: Not on file        Objective:  Physical Exam: BP (!) 132/93   Pulse 80   Temp 98.2 F (36.8 C) (Temporal)   Ht 5\' 7"  (1.702  m)   Wt 185 lb 6.4 oz (84.1 kg)   SpO2 97%   BMI 29.04 kg/m   Body mass index is 29.04 kg/m. Wt Readings from Last 3 Encounters:  07/09/20 185 lb 6.4 oz (84.1 kg)  05/07/20 182 lb (82.6 kg)  04/07/20 190 lb (86.2 kg)   Gen: NAD, resting comfortably HEENT: TMs normal bilaterally. OP clear. No thyromegaly noted.  CV: RRR with no murmurs appreciated Pulm: NWOB, CTAB with no crackles, wheezes, or rhonchi GI: Normal bowel sounds present. Soft, Nontender, Nondistended. MSK: no edema, cyanosis, or clubbing noted Breast: Slight area of hypopigmentation on right lateral breast.  Tender to palpation.  No lumps or masses noted.  Chaperone present for exam. Skin: warm, dry Neuro: CN2-12 grossly intact. Strength 5/5 in upper and lower extremities. Reflexes symmetric and intact bilaterally.  Psych: Normal affect and thought content     Kastin Cerda M. 06/07/20, MD 07/09/2020 12:03 PM

## 2020-07-09 NOTE — Telephone Encounter (Addendum)
I called pt. I advised pt that Dr. Vickey Huger reviewed their sleep study results and found that the pt has severe osa . Dr. Vickey Huger recommends that pt start auto-pap for treatment at home with machine of 5-15 cm. I reviewed PAP compliance expectations with the pt. Pt is agreeable to starting an auto-PAP. I advised pt that an order will be sent to a DME, [facility], and [facility] will call the pt within about one week after they file with the pt's insurance. [facility] will show the pt how to use the machine, fit for masks, and troubleshoot the auto-PAP if needed. A follow up appt was made for insurance purposes with Tylene Fantasia, NP 10/07/20 at 1000 am. Pt verbalized understanding to arrive 15 minutes early and bring their auto-PAP. A letter with all of this information in it will be mailed to the pt as a reminder. I verified with the pt that the address we have on file is correct. Pt verbalized understanding of results. Pt had no questions at this time but was encouraged to call back if questions arise. I have sent the order to [facility] and have received confirmation that they have received the order.

## 2020-07-09 NOTE — Telephone Encounter (Signed)
Pt returned phone call, would like a call back.  

## 2020-07-09 NOTE — Assessment & Plan Note (Signed)
Check A1c. 

## 2020-07-09 NOTE — Telephone Encounter (Signed)
IMPRESSION: This HST indicated severe OSA (obstructive sleep apnea) , not associated with hypoxemia. There was not enough sleep in REM stage recorded to allow differentiation of AHI by REM/ NREM sleep stages. Overall , this HST only recorded 4.5 hours of sleep.   RECOMMENDATION: This degree of OSA will need to be treated, either by CPAP or , PLAN B, by dental device. In a patient with possibly sleep related headaches, CPAP auto- titration would be there preferred way of therapy.   Auto CPAP 5-15 cm water, 2 cm EPR and mask of choice ( consider retrognathia)  , heated humidification.

## 2020-07-09 NOTE — Assessment & Plan Note (Signed)
Follows with neurology.  Is on Ajovy.

## 2020-07-09 NOTE — Assessment & Plan Note (Signed)
Pain is still not fully controlled.  She does not want to do any narcotic medication at this point.  We will continue amitriptyline 10 mg nightly.  She was recently found to have severe sleep apnea which could be contributing.  At some point in the future may consider trial of Lyrica.

## 2020-07-09 NOTE — Progress Notes (Signed)
07/09/20 lvm  

## 2020-07-10 NOTE — Progress Notes (Signed)
Please inform patient of the following:  Cholesterol and blood sugar both borderline.  Everything else is stable.  Do not need to start meds but she should continue to work on diet and exercise and we can recheck in a year or so.

## 2020-07-14 ENCOUNTER — Other Ambulatory Visit: Payer: Self-pay

## 2020-07-14 DIAGNOSIS — R899 Unspecified abnormal finding in specimens from other organs, systems and tissues: Secondary | ICD-10-CM

## 2020-08-01 ENCOUNTER — Other Ambulatory Visit: Payer: Self-pay | Admitting: Family Medicine

## 2020-08-01 DIAGNOSIS — F321 Major depressive disorder, single episode, moderate: Secondary | ICD-10-CM

## 2020-08-10 ENCOUNTER — Ambulatory Visit (INDEPENDENT_AMBULATORY_CARE_PROVIDER_SITE_OTHER): Payer: 59 | Admitting: Neurology

## 2020-08-10 ENCOUNTER — Encounter: Payer: Self-pay | Admitting: Neurology

## 2020-08-10 VITALS — BP 133/98 | HR 80 | Ht 67.0 in | Wt 186.0 lb

## 2020-08-10 DIAGNOSIS — Z9989 Dependence on other enabling machines and devices: Secondary | ICD-10-CM

## 2020-08-10 DIAGNOSIS — G4733 Obstructive sleep apnea (adult) (pediatric): Secondary | ICD-10-CM

## 2020-08-10 NOTE — Progress Notes (Signed)
[facility]    Provider:  Dr Lucia Gaskins Requesting Provider: Ardith Dark, MD Primary Care Provider:  Ardith Dark, MD  CC:  Intractable migraines  Interval history: Severe OSA diagnosed, she has only been on cpap for one month now. She has been using the cpap from 12 hours a ight. She is keeping it on. She started the bginning of June. Not long enough to assess for improvement. Reassured patient.   Sleep results copied from chart"This HST indicated severe OSA (obstructive sleep apnea) , not associated with hypoxemia. There was not enough sleep in REM stage recorded to allow differentiation of AHI by REM/ NREM sleep stages."  Reviewed sleep study, pAHI 30/hour  HPI:  Melissa [name] is a 40 y.o. female here as requested by Ardith Dark, MD for migraines. She is here with her mother who also provides much information. Pmhx migraine. Her headaches used to be around her eyes, now it is at the crown of the head,started 10 years ago,  She has been to multiple neurologists in [place] and [employer] [facility]. She has migraines every day that are moderately severe, pulsating/pounding, pain, throbbing, light and sound sensitivity, nausea, no vomiting, movement make it worse, bending over makes it worse, she has associated vertigo, wakes up with the migraines and headaches, when she gets up and she sits up its awful, Emgality helped a little but didn't help entirely, she did no ttry any of the other medications, No aura. No medication overuse. Ongoing for over a year at this severity, they can last as long as a week. Here with mother who also provides much information. She has vision loss with the headaches and blurry vision.  Can be 9-10 out of 10 in pain, no associated unilateral numbness or weakness, worsened around her menses, Emgality helped a little bit, no other focal neurologic deficits, associated symptoms, inciting events or modifiable factors.  From a thorough review of records,  medications tried that can be used in migraine management includes; Elavil, emgality, baclofen, flexeril, lexapro, neurontin, antivert, naproxen, topiramate(side effects), prednisone tabs, sumatriptan, emgality,verapamil, Flexeril, hydroxyzine, gabapentin, naproxen, ibuprofen, Imitrex nasal spray  Reviewed notes, labs and imaging from outside physicians, which showed:    I reviewed Dr. Moises Blood notes, neurologist at [facility] neurology, from 2019, patient reported migraine started at the age of 52-33, at the crown occipital region sharp, severity 8-9 over 10, no prodrome or post trauma, associated nausea vomiting photophobia phonophobia osmophobia blurred vision, no unilateral numbness or weakness, having them at least 15 headache days a month, more around her menses, stress can be a trigger, Review of Systems: Patient complains of symptoms per HPI as well as the following symptoms: body pain/fibromyalgia. Pertinent negatives and positives per HPI. All others negative.   Social History   Socioeconomic History   Marital status: Single    Spouse name: Not on file   Number of children: 0   Years of education: Not on file   Highest education level: Bachelor's degree (e.g., BA, AB, BS)  Occupational History   Occupation: Marine scientist: [employer]  Tobacco Use   Smoking status: Former    Pack years: 0.00    Types: Cigars    Quit date: 01/01/2018    Years since quitting: 2.6   Smokeless tobacco: Never   Tobacco comments:    Black and Milds  Vaping Use   Vaping Use: Never used  Substance and Sexual Activity   Alcohol use: Yes  Alcohol/week: 2.0 standard drinks    Types: 2 Cans of beer per week   Drug use: No    Comment: uses CBD   Sexual activity: Not Currently  Other Topics Concern   Not on file  Social History Narrative   Patient is right-handed. She lives in a single level home. Her mother lives with her. She does not exercise.   Caffeine: Kombucha   Social Determinants  of Health   Financial Resource Strain: Not on file  Food Insecurity: Not on file  Transportation Needs: Not on file  Physical Activity: Not on file  Stress: Not on file  Social Connections: Not on file  Intimate Partner Violence: Not on file    Family History  Problem Relation Age of Onset   Arthritis Mother    Diabetes Mother    High Cholesterol Mother    Migraines Mother    Cancer Father    High blood pressure Father    High Cholesterol Sister    High blood pressure Sister    Kidney disease Maternal Grandmother    Cancer Maternal Grandfather    Heart attack Maternal Grandfather    High Cholesterol Paternal Grandmother    High blood pressure Paternal Grandmother    High blood pressure Paternal Grandfather     Past Medical History:  Diagnosis Date   Asthma    Fibromyalgia    IBS (irritable bowel syndrome)    Migraine     Patient Active Problem List   Diagnosis Date Noted   Hypersomnia with long sleep time, idiopathic 07/07/2020   Chronic fatigue 07/07/2020   OSA (obstructive sleep apnea) 07/07/2020   Intractable migraine with status migrainosus 04/07/2020   Hyperglycemia 11/12/2018   Dyslipidemia 11/12/2018   Depression, major, single episode, moderate (HCC) 09/22/2017   Fibromyalgia 09/22/2017   RHINITIS, ALLERGIC 03/30/2006   Asthma 03/30/2006   IRRITABLE BOWEL SYNDROME 03/30/2006    Past Surgical History:  Procedure Laterality Date   [facility]  2008    Current Outpatient Medications  Medication Sig Dispense Refill   AJOVY 225 MG/1.5ML SOAJ INJECT 225 MG INTO THE SKIN EVERY 30 (THIRTY) DAYS. 1.5 mL 11   albuterol (PROVENTIL) (2.5 MG/3ML) 0.083% nebulizer solution Take 3 mLs (2.5 mg total) by nebulization every 6 (six) hours as needed for wheezing or shortness of breath. 150 mL 1   albuterol (VENTOLIN HFA) 108 (90 Base) MCG/ACT inhaler TAKE 2 PUFFS BY MOUTH EVERY 6 HOURS AS NEEDED FOR WHEEZE OR SHORTNESS OF BREATH 18 each 3   amitriptyline (ELAVIL) 10 MG  tablet TAKE 1 TABLET BY MOUTH EVERYDAY AT BEDTIME 90 tablet 0   Ascorbic Acid (VITAMIN C) 1000 MG tablet Take 1,000 mg by mouth daily.     budesonide-formoterol (SYMBICORT) 80-4.5 MCG/ACT inhaler Inhale 2 puffs into the lungs 2 (two) times daily. 1 Inhaler 3   CHLOROPHYLL PO Take by mouth.     cholecalciferol (VITAMIN D) 1000 units tablet Take 1,000 Units by mouth daily.     COD LIVER OIL PO Take by mouth.     Cyanocobalamin (VITAMIN B-12 PO) Take 1,000 mcg by mouth daily.     dicyclomine (BENTYL) 20 MG tablet Take 1 tablet (20 mg total) by mouth 4 (four) times daily -  before meals and at bedtime. 270 tablet 5   escitalopram (LEXAPRO) 20 MG tablet TAKE 1 TABLET BY MOUTH EVERY DAY 90 tablet 3   meclizine (ANTIVERT) 25 MG tablet Take 1 tablet (25 mg total) by mouth 2 (two)  times daily as needed for dizziness. 30 tablet 5   Multiple Vitamin (MULTIVITAMIN) tablet Take 1 tablet by mouth daily.     Omega-3 Fatty Acids (FISH OIL) 1000 MG CAPS Take by mouth.     OVER THE COUNTER MEDICATION Red beet water mix-in     pantoprazole (PROTONIX) 40 MG tablet TAKE 1 TABLET BY MOUTH EVERY DAY 90 tablet 3   pyridoxine (B-6) 250 MG tablet Take 250 mg by mouth daily.     Riboflavin (B-2-400 PO) Take by mouth.     Current Facility-Administered Medications  Medication Dose Route Frequency Provider Last Rate Last Admin   Fremanezumab-vfrm SOSY 25 mg  25 mg Subcutaneous Once Naomie DeanAhern, Rachael Ferrie B, MD        Allergies as of 08/10/2020 - Review Complete 08/10/2020  Allergen Reaction Noted   Asa [aspirin]  06/30/2014   Latex  06/30/2014   Penicillins  11/24/2017   Vitamin e Hives 04/14/2009    Vitals: BP (!) 133/98 (BP Location: Right Arm, Patient Position: Sitting)   Pulse 80   Ht 5\' 7"  (1.702 m)   Wt 186 lb (84.4 kg)   BMI 29.13 kg/m  Last Weight:  Wt Readings from Last 1 Encounters:  08/10/20 186 lb (84.4 kg)   Last Height:   Ht Readings from Last 1 Encounters:  08/10/20 5\' 7"  (1.702 m)     Exam: NAD, pleasant                  Speech:    Speech is normal; fluent and spontaneous with normal comprehension.  Cognition:    The patient is oriented to person, place, and time;     recent and remote memory intact;     language fluent;    Cranial Nerves:    The pupils are equal, round, and reactive to light.Trigeminal sensation is intact and the muscles of mastication are normal. The face is symmetric. The palate elevates in the midline. Hearing intact. Voice is normal. Shoulder shrug is normal. The tongue has normal motion without fasciculations.   Coordination:  No dysmetria  Motor Observation:    No asymmetry, no atrophy, and no involuntary movements noted. Tone:    Normal muscle tone.     Strength:    Strength is V/V in the upper and lower limbs.      Sensation: intact to LT     Assessment/Plan:  40 year old with chronic migraines and chronic daily headaches  Severe OSA diagnosed, she has only been on cpap for < one month now. She has been using the cpap from 12 hours a ight. She is keeping it on. She started the bginning of June. Not long enough to assess for improvement. Reassured patient. N0 CHARGE TODAY, follow up in September. Continue Ajovy.   MRI brain 05/08/20 unremarkable  Start Ajovy for prevention MRI: MRI brain due to concerning symptoms of morning headaches, positional headaches,vision changes  to look for space occupying mass, chiari or intracranial hypertension (pseudotumor). Excessive daytime somnolence(ESS 18/30), snoring, morning headache: sleep evaluation  No orders of the defined types were placed in this encounter.  No orders of the defined types were placed in this encounter.  Discussed: To prevent or relieve headaches, try the following: Cool Compress. Lie down and place a cool compress on your head.  Avoid headache triggers. If certain foods or odors seem to have triggered your migraines in the past, avoid them. A headache diary might  help you identify triggers.  Include  physical activity in your daily routine. Try a daily walk or other moderate aerobic exercise.  Manage stress. Find healthy ways to cope with the stressors, such as delegating tasks on your to-do list.  Practice relaxation techniques. Try deep breathing, yoga, massage and visualization.  Eat regularly. Eating regularly scheduled meals and maintaining a healthy diet might help prevent headaches. Also, drink plenty of fluids.  Follow a regular sleep schedule. Sleep deprivation might contribute to headaches Consider biofeedback. With this mind-body technique, you learn to control certain bodily functions -- such as muscle tension, heart rate and blood pressure -- to prevent headaches or reduce headache pain.    Proceed to emergency room if you experience new or worsening symptoms or symptoms do not resolve, if you have new neurologic symptoms or if headache is severe, or for any concerning symptom.   Provided education and documentation from American headache Society toolbox including articles on: chronic migraine medication overuse headache, chronic migraines, prevention of migraines, behavioral and other nonpharmacologic treatments for headache.  Cc: Ardith Dark, MD,  Ardith Dark, MD  Naomie Dean, MD  [facility] [address] [place], [place] [address]  Phone [phone] Fax [phone]

## 2020-08-21 ENCOUNTER — Ambulatory Visit
Admission: RE | Admit: 2020-08-21 | Discharge: 2020-08-21 | Disposition: A | Discharge status: Home / Self Care | Payer: 59 | Source: Ambulatory Visit | Attending: Family Medicine | Admitting: Family Medicine

## 2020-08-21 ENCOUNTER — Other Ambulatory Visit: Payer: Self-pay

## 2020-08-21 DIAGNOSIS — N644 Mastodynia: Secondary | ICD-10-CM

## 2020-08-27 ENCOUNTER — Ambulatory Visit (INDEPENDENT_AMBULATORY_CARE_PROVIDER_SITE_OTHER): Payer: 59 | Admitting: Family Medicine

## 2020-08-27 ENCOUNTER — Other Ambulatory Visit: Payer: Self-pay

## 2020-08-27 ENCOUNTER — Encounter: Payer: Self-pay | Admitting: Family Medicine

## 2020-08-27 VITALS — BP 136/94 | HR 85 | Temp 97.7°F | Resp 98 | Ht 67.0 in | Wt 181.0 lb

## 2020-08-27 DIAGNOSIS — F321 Major depressive disorder, single episode, moderate: Secondary | ICD-10-CM | POA: Diagnosis not present

## 2020-08-27 DIAGNOSIS — R5382 Chronic fatigue, unspecified: Secondary | ICD-10-CM | POA: Diagnosis not present

## 2020-08-27 DIAGNOSIS — M797 Fibromyalgia: Secondary | ICD-10-CM | POA: Diagnosis not present

## 2020-08-27 MED ORDER — PROMETHAZINE HCL 12.5 MG PO TABS
12.5000 mg | ORAL_TABLET | Freq: Three times a day (TID) | ORAL | 0 refills | Status: DC | PRN
Start: 2020-08-27 — End: 2021-01-18

## 2020-08-27 NOTE — Assessment & Plan Note (Signed)
Due to fibromyalgia.  Hopefully amitriptyline should help some with this.  She is also being treated for OSA which hopefully will help with her fatigue as well.

## 2020-08-27 NOTE — Assessment & Plan Note (Signed)
Still has significantly bothersome symptoms.  She is currently looking to apply for disability and has contacted a lawyer about this.  She does not want to do narcotics for pain management.  She has issues with fatigue, insomnia, and chronic pain.  No depressive symptoms have also worsened.  We discussed potential treatment options including switching her Lexapro over to Cymbalta and potentially trying Lyrica in the future.  We will be increasing her amitriptyline to 20 mg nightly.  Hopefully this should help some with her mood, pain, and insomnia issues.  She will check with me in a week or so via MyChart.  Will also place referral to see if there are any dermatologist in the area that we will help manage her neuralgia.

## 2020-08-27 NOTE — Assessment & Plan Note (Signed)
Patient reluctant to change medications.  Continue Lexapro 20 mg daily.  We will be slightly increasing her dose of amitriptyline as above.  She will check with me in a week or so via MyChart.

## 2020-08-27 NOTE — Patient Instructions (Signed)
It was very nice to see you today!  Please increase your amitriptyline to 20 mg nightly.  I will look and see if we can get anybody else we will treat your fibromyalgia.  You may benefit from starting Cymbalta or Lyrica.  Please let me know if you like to try either of these.  See me a message in a week to let me know how you are doing.  Take care, Dr [name]  PLEASE NOTE:  If you had any lab tests please let us know if you have not heard back within a few days. You may see your results on mychart before we have a chance to review them but we will give you a call once they are reviewed by [place]. If we ordered any referrals today, please let us know if you have not heard from their office within the next week.   Please try these tips to maintain a healthy lifestyle:  Eat at least 3 REAL meals and 1-2 snacks per day.  Aim for no more than 5 hours between eating.  If you eat breakfast, please do so within one hour of getting up.   Each meal should contain half fruits/vegetables, one quarter protein, and one quarter carbs (no bigger than a computer mouse)  Cut down on sweet beverages. This includes juice, soda, and sweet tea.   Drink at least 1 glass of water with each meal and aim for at least 8 glasses per day  Exercise at least 150 minutes every week.

## 2020-08-27 NOTE — Progress Notes (Signed)
   Jasreet Dickie is a 40 y.o. female who presents today for an office visit.  Assessment/Plan:  Chronic Problems Addressed Today: Fibromyalgia Still has significantly bothersome symptoms.  She is currently looking to apply for disability and has contacted a lawyer about this.  She does not want to do narcotics for pain management.  She has issues with fatigue, insomnia, and chronic pain.  No depressive symptoms have also worsened.  We discussed potential treatment options including switching her Lexapro over to Cymbalta and potentially trying Lyrica in the future.  We will be increasing her amitriptyline to 20 mg nightly.  Hopefully this should help some with her mood, pain, and insomnia issues.  She will check with me in a week or so via MyChart.  Will also place referral to see if there are any dermatologist in the area that we will help manage her neuralgia.  Depression, major, single episode, moderate (HCC) Patient reluctant to change medications.  Continue Lexapro 20 mg daily.  We will be slightly increasing her dose of amitriptyline as above.  She will check with me in a week or so via MyChart.  Chronic fatigue Due to fibromyalgia.  Hopefully amitriptyline should help some with this.  She is also being treated for OSA which hopefully will help with her fatigue as well.     Subjective:  HPI:  She complains of her fibromyalgia and states that it is getting worse,. Associated symptoms are delay speech, tiredness, pain, brain fog and fatigue. She states that she is having hard time sleeping because of pain. She states her speech have been worsen.  She have a hx of migraines, OCD disorder,insomnia and depression.  She is currently on Amitriptyline 10 mg and she is willing to try the higher dosage of Amitriptyline. Today she asked for refill for her nausea medication.        Objective:  Physical Exam: BP (!) 136/94   Pulse 85   Temp 97.7 F (36.5 C) (Temporal)   Resp (!) 98   Ht 5'  7" (1.702 m)   Wt 181 lb (82.1 kg)   LMP 08/13/2020 (Exact Date)   BMI 28.35 kg/m   Gen: No acute distress, resting comfortably Neuro: Grossly normal, moves all extremities Psych: Normal affect and thought content       I,Savera Zaman,acting as a scribe for Jacquiline Doe, MD.,have documented all relevant documentation on the behalf of Jacquiline Doe, MD,as directed by  Jacquiline Doe, MD while in the presence of Jacquiline Doe, MD.  I, Jacquiline Doe, MD, have reviewed all documentation for this visit. The documentation on 08/27/20 for the exam, diagnosis, procedures, and orders are all accurate and complete.  Time Spent: 44 minutes of total time was spent on the date of the encounter performing the following actions: chart review prior to seeing the patient including recent specialist visits, obtaining history, performing a medically necessary exam, counseling on the treatment plan, placing orders, and documenting in our EHR.     Katina Degree. [name], MD 08/27/2020 10:49 AM

## 2020-09-04 ENCOUNTER — Telehealth: Payer: Self-pay

## 2020-09-04 DIAGNOSIS — F321 Major depressive disorder, single episode, moderate: Secondary | ICD-10-CM

## 2020-09-04 MED ORDER — AMITRIPTYLINE HCL 10 MG PO TABS: 20.0000 mg | ORAL_TABLET | Freq: Every day | ORAL | 1 refills | Status: DC

## 2020-09-04 NOTE — Telephone Encounter (Signed)
Rx was sent to pharmacy with new directions.

## 2020-09-04 NOTE — Telephone Encounter (Signed)
MEDICATION: amitriptyline to 20 mg nightly  PHARMACY:  [id number] [place],  - [address] Phone:  [phone]  Fax:  [phone]      Comments: Patient states this is working really great for her and would like it sent in.       **Let patient know to contact pharmacy at the end of the day to make sure medication is ready. **  ** Please notify patient to allow 48-72 hours to process**  **Encourage patient to contact the pharmacy for refills or they can request refills through [facility]**

## 2020-09-14 ENCOUNTER — Telehealth: Payer: Self-pay

## 2020-09-14 NOTE — Telephone Encounter (Signed)
See below

## 2020-09-14 NOTE — Telephone Encounter (Signed)
Patient is calling in requesting a medication for sleep, talked with Dr.Parker about this last visit but declined but patient feels like she needs something.

## 2020-09-14 NOTE — Telephone Encounter (Signed)
Called and spoke with Pt and she states you and her talked about Rx'ing her something completely different for sleep, she is not referring to the Amitripyline increase.

## 2020-09-14 NOTE — Telephone Encounter (Signed)
Please clarify with patient. We discussed increasing her amitriptyline. Is this what she is referring to?  It will help her with sleep.

## 2020-09-15 NOTE — Telephone Encounter (Signed)
Called and spoke with pt and she states she does not use Cymbalta or Lyrica or Lexaprol to help her sleep she is wanting a sleep aid.

## 2020-09-15 NOTE — Telephone Encounter (Signed)
Called and gave pt below message and she states she will give the office a call to schedule a visit to discuss sleep meds.

## 2020-09-15 NOTE — Telephone Encounter (Signed)
Only other medications we discussed are cymbalta and lyrica. We can send either of these in if she wishes. We would have to change her lexapro to cymbalta or we could add on lyrica to what she is doing already.  Katina Degree. [name], MD 09/15/2020 1:02 PM

## 2020-09-15 NOTE — Telephone Encounter (Signed)
She is on lexapro. We discussed changing at her last office visit and she declined and it is on her medication list. Recommend she come back in for office visit to go over meds if she is confused what she is taking.  Melissa [name]. [name], MD 09/15/2020 1:30 PM

## 2020-09-21 ENCOUNTER — Telehealth (INDEPENDENT_AMBULATORY_CARE_PROVIDER_SITE_OTHER): Payer: 59 | Admitting: Family Medicine

## 2020-09-21 ENCOUNTER — Encounter: Payer: Self-pay | Admitting: Family Medicine

## 2020-09-21 ENCOUNTER — Telehealth: Payer: Self-pay

## 2020-09-21 ENCOUNTER — Other Ambulatory Visit: Payer: Self-pay | Admitting: Family Medicine

## 2020-09-21 VITALS — Ht 67.0 in | Wt 181.0 lb

## 2020-09-21 DIAGNOSIS — G4700 Insomnia, unspecified: Secondary | ICD-10-CM | POA: Insufficient documentation

## 2020-09-21 DIAGNOSIS — M797 Fibromyalgia: Secondary | ICD-10-CM

## 2020-09-21 MED ORDER — RAMELTEON 8 MG PO TABS: 8.0000 mg | ORAL_TABLET | Freq: Every day | ORAL | 5 refills | Status: DC

## 2020-09-21 NOTE — Assessment & Plan Note (Signed)
Discussed treatment options with patient.  Symptoms are currently not controlled.  She has tried trazodone and hydroxyzine in the past which did not work well.  We will start ramelteon 8 mg nightly.  If this is cost prohibitive or if it does not work Belsomra would be next alternative.  If still has ongoing issues would consider trial of Ambien or Lunesta.

## 2020-09-21 NOTE — Telephone Encounter (Signed)
Does she need prior authorization?

## 2020-09-21 NOTE — Progress Notes (Deleted)
   Melissa [name] is a 40 y.o. female who presents today for a virtual office visit.  Assessment/Plan:  New/Acute Problems: ***  Chronic Problems Addressed Today: No problem-specific Assessment & Plan notes found for this encounter.     Subjective:  HPI:          Objective/Observations  Physical Exam: Gen: NAD, resting comfortably Pulm: Normal work of breathing Neuro: Grossly normal, moves all extremities Psych: Normal affect and thought content  Virtual Visit via Video   I connected with [name] on @TODAY @ at  4:00 PM EDT by a video enabled telemedicine application and verified that I am speaking with the correct person using two identifiers. The limitations of evaluation and management by telemedicine and the availability of in person appointments were discussed. The patient expressed understanding and agreed to proceed.   Patient location: Home Provider location: [place] Persons participating in the virtual visit: Myself and ***Patient     I,[employer] as a Engineer, manufacturing systems for Neurosurgeon, MD.,have documented all relevant documentation on the behalf of Jacquiline Doe, MD,as directed by  Jacquiline Doe, MD while in the presence of Jacquiline Doe, MD. *** Jacquiline Doe. Katina Degree, MD 09/21/2020 8:03 AM

## 2020-09-21 NOTE — Assessment & Plan Note (Signed)
Still not well controlled.  Her lack of sleep is likely contributing to this.  She is on amitriptyline 20 mg nightly.  She is not sure if this is made much of a difference.  We will be treating her insomnia as above.  Hopefully this will help some with her fibromyalgia symptoms as well.

## 2020-09-21 NOTE — Telephone Encounter (Signed)
She has tried a couple of alternatives already. We can wait on PA if patient is ok with that.  Katina Degree. [name], MD 09/21/2020 3:02 PM

## 2020-09-21 NOTE — Progress Notes (Signed)
   Melissa [name] is a 40 y.o. female who presents today for a virtual office visit.  Assessment/Plan:  Chronic Problems Addressed Today: Insomnia Discussed treatment options with patient.  Symptoms are currently not controlled.  She has tried trazodone and hydroxyzine in the past which did not work well.  We will start ramelteon 8 mg nightly.  If this is cost prohibitive or if it does not work Belsomra would be next alternative.  If still has ongoing issues would consider trial of Ambien or Lunesta.  Fibromyalgia Still not well controlled.  Her lack of sleep is likely contributing to this.  She is on amitriptyline 20 mg nightly.  She is not sure if this is made much of a difference.  We will be treating her insomnia as above.  Hopefully this will help some with her fibromyalgia symptoms as well.     Subjective:  HPI:  See A/p.         Objective/Observations  Physical Exam: Gen: NAD, resting comfortably Pulm: Normal work of breathing Neuro: Grossly normal, moves all extremities Psych: Normal affect and thought content  Virtual Visit via Video   I connected with Melissa [name] on 09/21/20 at  4:00 PM EDT by a video enabled telemedicine application and verified that I am speaking with the correct person using two identifiers. The limitations of evaluation and management by telemedicine and the availability of in person appointments were discussed. The patient expressed understanding and agreed to proceed.   Patient location: Home Provider location: [place] [employer] Persons participating in the virtual visit: Myself and Patient     Melissa [name]. [name], MD 09/21/2020 10:29 AM

## 2020-09-21 NOTE — Telephone Encounter (Signed)
PA has been submitted, did you want to try another med for pt also?

## 2020-09-21 NOTE — Telephone Encounter (Signed)
PA has been submitted.

## 2020-09-21 NOTE — Telephone Encounter (Signed)
See note from pharmacy.

## 2020-09-21 NOTE — Telephone Encounter (Signed)
Patient states the ramelteon (ROZEREM) 8 MG tablet is $400 out of pocket and in order for insurance to cover the medication she has to try another medication before hand .    Please advise  Thank you

## 2020-09-22 ENCOUNTER — Encounter: Payer: Self-pay | Admitting: *Deleted

## 2020-09-24 NOTE — Telephone Encounter (Signed)
Patient calling back to see how the PA is going. Patient would like a call back either way.

## 2020-09-25 NOTE — Telephone Encounter (Signed)
The PA was denied.

## 2020-09-28 ENCOUNTER — Telehealth: Payer: Self-pay | Admitting: *Deleted

## 2020-09-28 NOTE — Telephone Encounter (Signed)
Rx Ramelteon was denied by insurance  Required to try Rx Temazepam, Zaleplon, Zolpidem, Eszopiclone

## 2020-09-29 MED ORDER — ZOLPIDEM TARTRATE 5 MG PO TABS: 5.0000 mg | ORAL_TABLET | Freq: Every evening | ORAL | 1 refills | Status: DC | PRN

## 2020-09-29 NOTE — Telephone Encounter (Signed)
Patient agree with Rx Ambien 5mg 

## 2020-09-29 NOTE — Telephone Encounter (Signed)
Please let patient know. We can send in [place] 5mg  nightly if she wishes to try this.  . Katina Degree, MD 09/29/2020 1:31 PM

## 2020-09-30 ENCOUNTER — Ambulatory Visit (INDEPENDENT_AMBULATORY_CARE_PROVIDER_SITE_OTHER): Payer: 59 | Admitting: Family Medicine

## 2020-09-30 ENCOUNTER — Other Ambulatory Visit (HOSPITAL_COMMUNITY)
Admission: RE | Admit: 2020-09-30 | Discharge: 2020-09-30 | Disposition: A | Discharge status: Home / Self Care | Payer: 59 | Source: Ambulatory Visit | Attending: Family Medicine | Admitting: Family Medicine

## 2020-09-30 ENCOUNTER — Other Ambulatory Visit: Payer: Self-pay

## 2020-09-30 ENCOUNTER — Encounter: Payer: Self-pay | Admitting: Family Medicine

## 2020-09-30 VITALS — BP 137/97 | HR 119 | Temp 97.2°F | Ht 67.0 in | Wt 179.2 lb

## 2020-09-30 DIAGNOSIS — J029 Acute pharyngitis, unspecified: Secondary | ICD-10-CM

## 2020-09-30 DIAGNOSIS — G4700 Insomnia, unspecified: Secondary | ICD-10-CM

## 2020-09-30 DIAGNOSIS — Z113 Encounter for screening for infections with a predominantly sexual mode of transmission: Secondary | ICD-10-CM | POA: Diagnosis not present

## 2020-09-30 LAB — POCT RAPID STREP A (OFFICE): Rapid Strep A Screen: NEGATIVE

## 2020-09-30 MED ORDER — DOXYCYCLINE HYCLATE 100 MG PO TABS: 100.0000 mg | ORAL_TABLET | Freq: Two times a day (BID) | ORAL | 0 refills | Status: DC

## 2020-09-30 NOTE — Patient Instructions (Signed)
It was very nice to see you today!  Your strep test is negative.  Will check for STIs today.  Please start the doxycycline if your symptoms worsen or if your test results come back positive.  We will contact you with results once we are able.  Take care, Dr [name]  PLEASE NOTE:  If you had any lab tests please let us know if you have not heard back within a few days. You may see your results on mychart before we have a chance to review them but we will give you a call once they are reviewed by [place]. If we ordered any referrals today, please let us know if you have not heard from their office within the next week.   Please try these tips to maintain a healthy lifestyle:  Eat at least 3 REAL meals and 1-2 snacks per day.  Aim for no more than 5 hours between eating.  If you eat breakfast, please do so within one hour of getting up.   Each meal should contain half fruits/vegetables, one quarter protein, and one quarter carbs (no bigger than a computer mouse)  Cut down on sweet beverages. This includes juice, soda, and sweet tea.   Drink at least 1 glass of water with each meal and aim for at least 8 glasses per day  Exercise at least 150 minutes every week.

## 2020-09-30 NOTE — Assessment & Plan Note (Signed)
Symptoms not controlled.  We saw her about a week ago for this.  Her insurance did not approve ramelteon.  We will try Ambien per insurance recommendations.  This was sent in yesterday.  She has not tried yet.  She will check in a few days after starting this.

## 2020-09-30 NOTE — Progress Notes (Signed)
   Melissa [name] is a 40 y.o. female who presents today for an office visit.  Assessment/Plan:  New/Acute Problems: Sore Throat Possibly viral pharyngitis.  Rapid strep negative.  She declined steroid burst and nasal sprays at this point.  We will swab for GC chlamydia per patient request.  Can continue over-the-counter meds.  She will let me know if not improving.  STI screening Check HIV and RPR.  Chronic Problems Addressed Today: Insomnia Symptoms not controlled.  We saw her about a week ago for this.  Her insurance did not approve ramelteon.  We will try Ambien per insurance recommendations.  This was sent in yesterday.  She has not tried yet.  She will check in a few days after starting this.     Subjective:  HPI:  Also, she is experiencing sore throat and ear pain. 09/27/2020 night she washed her hair, woke up 09/28/2020 morning and had significant pain which she attributed to getting water in her left ear, and has gradually worsened. Since then she also developed a sore throat. She admits to fever and chills, but the fibromyalgia confounds those symptoms. She denies cough or runny nose. The pain from her sore throat has decreased as the ear pain has decreased, but she still experiences pain when swallowing.         Objective:  Physical Exam: BP (!) 137/97   Pulse (!) 119   Temp (!) 97.2 F (36.2 C) (Temporal)   Ht 5\' 7"  (1.702 m)   Wt 179 lb 3.2 oz (81.3 kg)   LMP 09/14/2020   SpO2 96%   BMI 28.07 kg/m   Gen: No acute distress, resting comfortably HEENT: Left TM with effusion. Right TM clear. Bilateral Tonsillar hypertrophy noted.  CV: Regular rate and rhythm with no murmurs appreciated Pulm: Normal work of breathing, clear to auscultation bilaterally with no crackles, wheezes, or rhonchi Neuro: Grossly normal, moves all extremities Psych: Normal affect and thought content      I,Jordan Kelly,acting as a scribe for 09/16/2020, MD.,have documented all relevant  documentation on the behalf of Melissa Doe, MD,as directed by  Melissa Doe, MD while in the presence of Melissa Doe, MD.  I, Melissa Doe, MD, have reviewed all documentation for this visit. The documentation on 09/30/20 for the exam, diagnosis, procedures, and orders are all accurate and complete.   10/02/20. Katina Degree, MD 09/30/2020 9:23 AM

## 2020-10-01 LAB — HIV ANTIBODY (ROUTINE TESTING W REFLEX): HIV 1&2 Ab, 4th Generation: NONREACTIVE

## 2020-10-01 LAB — CYTOLOGY - NON PAP
Chlamydia: NEGATIVE
Comment: NEGATIVE
Comment: NORMAL
Neisseria Gonorrhea: NEGATIVE

## 2020-10-01 LAB — RPR: RPR Ser Ql: NONREACTIVE

## 2020-10-02 NOTE — Progress Notes (Signed)
Please inform patient of the following:  Good news! All of her labs are NORMAL. She probably had a viral illness. Would like for her to let us know if her symptoms are not improving.  Melissa [name]. [name], MD 10/02/2020 11:25 AM

## 2020-10-07 ENCOUNTER — Ambulatory Visit (INDEPENDENT_AMBULATORY_CARE_PROVIDER_SITE_OTHER): Payer: 59 | Admitting: Adult Health

## 2020-10-07 VITALS — BP 127/92 | HR 87 | Ht 67.0 in | Wt 182.6 lb

## 2020-10-07 DIAGNOSIS — Z9989 Dependence on other enabling machines and devices: Secondary | ICD-10-CM | POA: Diagnosis not present

## 2020-10-07 DIAGNOSIS — G4733 Obstructive sleep apnea (adult) (pediatric): Secondary | ICD-10-CM

## 2020-10-07 NOTE — Patient Instructions (Signed)

## 2020-10-07 NOTE — Progress Notes (Signed)
PATIENT: Melissa [name] DOB: Jun 14, 1980  REASON FOR VISIT: follow up HISTORY FROM: patient PRIMARY NEUROLOGIST: Dr. Vickey Huger  HISTORY OF PRESENT ILLNESS: Today 10/07/20:  Melissa [name] is a 40 year old female with a history of obstructive sleep apnea on CPAP.  Her download indicates that she used her machine 28 out of 30 days for compliance of 93%.  She use her machine greater than 4 hours 28 days for compliance of 93%.  On average she uses her machine 9 hours and 9 minutes.  Her residual AHI is 1.4 on 5 to 15 cm of water.  She reports that the CPAP is working well for her.  She reports that she does suffer with fibromyalgia and states that her sleepiness score is elevated due to that.  HISTORY Melissa [name] is a 40 y.o. year old Black or [place] American female patient seen here upon  referral by Dr Lucia Gaskins on 05/07/2020 Chief concern according to patient :   Pt alone, rm 10. Presents today to assess OSA a concern. She has never had a SS. States that her ex BF and mom have witnessed apnea events. Never been told she snores.  She avg 10/11 hrs of sleep and states that is sounds sleep. When she wakes up she is still tired.     Melissa [name]  has a past medical history of Asthma,IBS (irritable bowel syndrome), and Migraine.(migraines began in her thirties, getting more frequent).     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   and lives in a household with BF. 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 Tobacco use*= quit 2.5 years ago   ETOH use - " daily glass of wine ",  Caffeine intake none . Regular exercise - none .     Sleep habits are as follows: The patient's dinner time is between 8 PM. The patient goes to bed at 9 PM and continues to sleep for 10 hours, wakes for 1-2 bathroom breaks.   The preferred sleep position is sideways , with the support of 2 pillows.  Dreams are  reportedly infrequent.  7.15   AM is the usual rise time. The patient wakes up with an alarm.  She reports not feeling refreshed or restored in AM, with symptoms such as some morning headaches.  Naps are taken frequently, lasting from 5 hours  minutes and are no more refreshing than nocturnal sleep.     REVIEW OF SYSTEMS: Out of a complete 14 system review of symptoms, the patient complains only of the following symptoms, and all other reviewed systems are negative.   ESS 13  ALLERGIES: Allergies  Allergen Reactions   Tomato Anaphylaxis   Asa [Aspirin]    Latex    Penicillins    Vitamin E Hives    HOME MEDICATIONS: Outpatient Medications Prior to Visit  Medication Sig Dispense Refill   AJOVY 225 MG/1.5ML SOAJ INJECT 225 MG INTO THE SKIN EVERY 30 (THIRTY) DAYS. 1.5 mL 11   albuterol (PROVENTIL) (2.5 MG/3ML) 0.083% nebulizer solution Take 3 mLs (2.5 mg total) by nebulization every 6 (six) hours as needed for wheezing or shortness of breath. 150 mL 1   albuterol (VENTOLIN HFA) 108 (90 Base) MCG/ACT inhaler TAKE 2 PUFFS BY MOUTH EVERY 6 HOURS AS NEEDED FOR WHEEZE OR SHORTNESS OF BREATH 18 each 3   amitriptyline (ELAVIL) 10 MG tablet Take 2 tablets (20 mg total)  by mouth at bedtime. 60 tablet 1   Ascorbic Acid (VITAMIN C) 1000 MG tablet Take 1,000 mg by mouth daily.     budesonide-formoterol (SYMBICORT) 80-4.5 MCG/ACT inhaler Inhale 2 puffs into the lungs 2 (two) times daily. 1 Inhaler 3   CHLOROPHYLL PO Take by mouth.     cholecalciferol (VITAMIN D) 1000 units tablet Take 1,000 Units by mouth daily.     COD LIVER OIL PO Take by mouth.     Cyanocobalamin (VITAMIN B-12 PO) Take 1,000 mcg by mouth daily.     dicyclomine (BENTYL) 20 MG tablet Take 1 tablet (20 mg total) by mouth 4 (four) times daily -  before meals and at bedtime. 270 tablet 5   escitalopram (LEXAPRO) 20 MG tablet TAKE 1 TABLET BY MOUTH EVERY DAY 90 tablet 3   meclizine (ANTIVERT) 25 MG tablet Take 1 tablet (25 mg total)  by mouth 2 (two) times daily as needed for dizziness. 30 tablet 5   Multiple Vitamin (MULTIVITAMIN) tablet Take 1 tablet by mouth daily.     Omega-3 Fatty Acids (FISH OIL) 1000 MG CAPS Take by mouth.     OVER THE COUNTER MEDICATION Red beet water mix-in     pantoprazole (PROTONIX) 40 MG tablet TAKE 1 TABLET BY MOUTH EVERY DAY 90 tablet 3   promethazine (PHENERGAN) 12.5 MG tablet Take 1 tablet (12.5 mg total) by mouth every 8 (eight) hours as needed for nausea or vomiting. 20 tablet 0   pyridoxine (B-6) 250 MG tablet Take 250 mg by mouth daily.     Riboflavin (B-2-400 PO) Take by mouth.     zolpidem (AMBIEN) 5 MG tablet Take 1 tablet (5 mg total) by mouth at bedtime as needed for sleep. 15 tablet 1   ramelteon (ROZEREM) 8 MG tablet Take 1 tablet (8 mg total) by mouth at bedtime. (Patient not taking: Reported on 10/07/2020) 30 tablet 5   doxycycline (VIBRA-TABS) 100 MG tablet Take 1 tablet (100 mg total) by mouth 2 (two) times daily. 14 tablet 0   Facility-Administered Medications Prior to Visit  Medication Dose Route Frequency Provider Last Rate Last Admin   Fremanezumab-vfrm SOSY 25 mg  25 mg Subcutaneous Once Anson FretAhern, Antonia B, MD        PAST MEDICAL HISTORY: Past Medical History:  Diagnosis Date   Asthma    Fibromyalgia    IBS (irritable bowel syndrome)    Migraine     PAST SURGICAL HISTORY: Past Surgical History:  Pro[facility] Laterality Date   [facility]    FAMILY HISTORY: Family History  Problem Relation Age of Onset   Arthritis Mother    Diabetes Mother    High Cholesterol Mother    Migraines Mother    Cancer Father    High blood pressure Father    High Cholesterol Sister    High blood pressure Sister    Kidney disease Maternal Grandmother    Cancer Maternal Grandfather    Heart attack Maternal Grandfather    High Cholesterol Paternal Grandmother    High blood pressure Paternal Grandmother    High blood pressure Paternal Grandfather     SOCIAL HISTORY: Social  History   Socioeconomic History   Marital status: Single    Spouse name: Not on file   Number of children: 0   Years of education: Not on file   Highest education level: Bachelor's degree (e.g., BA, AB, BS)  Occupational History   Occupation: Marine scientistclerk    Employer: [employer]  Tobacco Use   Smoking status: Former    Types: Cigars    Quit date: 01/01/2018    Years since quitting: 2.7   Smokeless tobacco: Never   Tobacco comments:    Black and Milds  Vaping Use   Vaping Use: Never used  Substance and Sexual Activity   Alcohol use: Yes    Alcohol/week: 2.0 standard drinks    Types: 2 Cans of beer per week   Drug use: No    Comment: uses CBD   Sexual activity: Not Currently  Other Topics Concern   Not on file  Social History Narrative   Patient is right-handed. She lives in a single level home. Her mother lives with her. She does not exercise.   Caffeine: Kombucha   Social Determinants of Health   Financial Resource Strain: Not on file  Food Insecurity: Not on file  Transportation Needs: Not on file  Physical Activity: Not on file  Stress: Not on file  Social Connections: Not on file  Intimate Partner Violence: Not on file      PHYSICAL EXAM  Vitals:   10/07/20 0959  Height: 5\' 7"  (1.702 m)   Body mass index is 28.07 kg/m.  Generalized: Well developed, in no acute distress  Chest: Lungs clear to auscultation bilaterally  Neurological examination  Mentation: Alert oriented to time, place, history taking. Follows all commands speech and language fluent Cranial nerve II-XII: Extraocular movements were full, visual field were full on confrontational test Head turning and shoulder shrug  were normal and symmetric. Motor: The motor testing reveals 5 over 5 strength of all 4 extremities. Good symmetric motor tone is noted throughout.  Sensory: Sensory testing is intact to soft touch on all 4 extremities. No evidence of extinction is noted.  Gait and station: Gait is  normal.    DIAGNOSTIC DATA (LABS, IMAGING, TESTING) - I reviewed patient records, labs, notes, testing and imaging myself where available.  Lab Results  Component Value Date   WBC 5.8 07/09/2020   HGB 13.7 07/09/2020   HCT 40.3 07/09/2020   MCV 87.3 07/09/2020   PLT 173.0 07/09/2020      Component Value Date/Time   NA 136 07/09/2020 1201   NA 140 04/07/2020 0903   K 4.1 07/09/2020 1201   CL 101 07/09/2020 1201   CO2 25 07/09/2020 1201   GLUCOSE 106 (H) 07/09/2020 1201   BUN 8 07/09/2020 1201   BUN 9 04/07/2020 0903   CREATININE 0.77 07/09/2020 1201   CREATININE 0.69 11/09/2018 1534   CALCIUM 9.5 07/09/2020 1201   PROT 8.0 07/09/2020 1201   PROT 7.7 04/07/2020 0903   ALBUMIN 4.5 07/09/2020 1201   ALBUMIN 4.5 04/07/2020 0903   AST 37 07/09/2020 1201   ALT 54 (H) 07/09/2020 1201   ALKPHOS 41 07/09/2020 1201   BILITOT 0.6 07/09/2020 1201   BILITOT 0.2 04/07/2020 0903   GFRNONAA >60 08/15/2016 1618   GFRAA <=90 11/18/2019 0000   Lab Results  Component Value Date   CHOL 245 (H) 07/09/2020   HDL 52.30 07/09/2020   LDLCALC 170 (H) 11/09/2018   LDLDIRECT 164.0 07/09/2020   TRIG 211.0 (H) 07/09/2020   CHOLHDL 5 07/09/2020   Lab Results  Component Value Date   HGBA1C 6.2 07/09/2020   Lab Results  Component Value Date   VITAMINB12 >1550 (H) 07/09/2020   Lab Results  Component Value Date   TSH 1.19 07/09/2020      ASSESSMENT AND PLAN 40 y.o. year  old female  has a past medical history of Asthma, Fibromyalgia, IBS (irritable bowel syndrome), and Migraine. here with:  OSA on CPAP  - CPAP compliance excellent - Good treatment of AHI  - Encourage patient to use CPAP nightly and > 4 hours each night - F/U in 1 year or sooner if needed    Butch Penny, MSN, NP-C 10/07/2020, 10:07 AM [facility] [address], [address] [place], [place] [address] [phone]

## 2020-10-09 ENCOUNTER — Telehealth: Payer: Self-pay

## 2020-10-09 ENCOUNTER — Other Ambulatory Visit: Payer: Self-pay

## 2020-10-09 ENCOUNTER — Ambulatory Visit (INDEPENDENT_AMBULATORY_CARE_PROVIDER_SITE_OTHER): Payer: 59

## 2020-10-09 DIAGNOSIS — Z23 Encounter for immunization: Secondary | ICD-10-CM

## 2020-10-09 NOTE — Telephone Encounter (Signed)
See below

## 2020-10-09 NOTE — Telephone Encounter (Signed)
We can send in 10mg  dose but I am not sure if insurance will pay for it.  . Katina Degree, MD 10/09/2020 2:14 PM

## 2020-10-09 NOTE — Telephone Encounter (Signed)
See previous note sent to Dr. [name].

## 2020-10-09 NOTE — Telephone Encounter (Signed)
Pt called regarding her Ambien. Lynne stated that Dr [name] recently put her on Ambien but she thinks she may need a high dosage. She stated that some nights it works and some nights it doesn't. Charleene wants to know if she can have a higher dosage. Please Advise.

## 2020-10-09 NOTE — Telephone Encounter (Cosign Needed)
Patient came in office today for her flu shot. She stated that Dr.Parker recently started her on a new sleeping medication, she wants to know if Dr.Parker would be willing to increase the dosage of the medication. She does not want to change the medication because it has been doing well, would just like the dosage increased if possible.

## 2020-10-12 ENCOUNTER — Telehealth: Payer: Self-pay

## 2020-10-12 NOTE — Telephone Encounter (Signed)
Pt was returning a call.  

## 2020-10-12 NOTE — Telephone Encounter (Signed)
Called and lm for pt tcb. 

## 2020-10-12 NOTE — Telephone Encounter (Signed)
Spoke with patient regarding the possible sleeping medication increase, she would like for [place] to try to send in the increased dose and see what insurance does. Stated we can try a PA afterwards if possible

## 2020-10-13 MED ORDER — ZOLPIDEM TARTRATE 10 MG PO TABS: 10.0000 mg | ORAL_TABLET | Freq: Every evening | ORAL | 5 refills | Status: DC | PRN

## 2020-10-13 NOTE — Addendum Note (Signed)
Addended by: Ardith Dark on: 10/13/2020 07:55 AM   Modules accepted: Orders

## 2020-11-15 ENCOUNTER — Other Ambulatory Visit: Payer: Self-pay | Admitting: Family Medicine

## 2020-11-15 DIAGNOSIS — F321 Major depressive disorder, single episode, moderate: Secondary | ICD-10-CM

## 2020-12-06 ENCOUNTER — Other Ambulatory Visit: Payer: Self-pay | Admitting: Family Medicine

## 2020-12-25 ENCOUNTER — Encounter (HOSPITAL_COMMUNITY): Payer: Self-pay | Admitting: Emergency Medicine

## 2020-12-25 ENCOUNTER — Other Ambulatory Visit: Payer: Self-pay

## 2020-12-25 ENCOUNTER — Emergency Department (HOSPITAL_COMMUNITY)
Admission: EM | Admit: 2020-12-25 | Discharge: 2020-12-25 | Disposition: A | Discharge status: Home / Self Care | Payer: 59 | Attending: Emergency Medicine | Admitting: Emergency Medicine

## 2020-12-25 DIAGNOSIS — Z87891 Personal history of nicotine dependence: Secondary | ICD-10-CM | POA: Insufficient documentation

## 2020-12-25 DIAGNOSIS — J45909 Unspecified asthma, uncomplicated: Secondary | ICD-10-CM | POA: Diagnosis not present

## 2020-12-25 DIAGNOSIS — R0602 Shortness of breath: Secondary | ICD-10-CM | POA: Diagnosis present

## 2020-12-25 DIAGNOSIS — Z7951 Long term (current) use of inhaled steroids: Secondary | ICD-10-CM | POA: Insufficient documentation

## 2020-12-25 DIAGNOSIS — Z91040 Latex allergy status: Secondary | ICD-10-CM | POA: Insufficient documentation

## 2020-12-25 DIAGNOSIS — L509 Urticaria, unspecified: Secondary | ICD-10-CM

## 2020-12-25 DIAGNOSIS — T7840XA Allergy, unspecified, initial encounter: Secondary | ICD-10-CM | POA: Diagnosis not present

## 2020-12-25 MED ORDER — PREDNISONE 20 MG PO TABS: 40.0000 mg | ORAL_TABLET | Freq: Every day | ORAL | 0 refills | Status: DC

## 2020-12-25 MED ORDER — DEXAMETHASONE SODIUM PHOSPHATE 10 MG/ML IJ SOLN
10.0000 mg | Freq: Once | INTRAMUSCULAR | Status: AC
  Administered 2020-12-25: 10 mg via INTRAMUSCULAR
  Filled 2020-12-25: qty 1

## 2020-12-25 NOTE — ED Provider Notes (Signed)
Emergency Medicine Provider Triage Evaluation Note  Melissa [name] , a 40 y.o. female  was evaluated in triage.  Pt complains of allergic reaction.  States began yesterday.  Has history of similar.  States she took Benadryl however cannot tell me how much she took.  No history of anaphylaxis, no new lotions, perfumes.  She is unsure what she is allergic to.  No airway compromise  Review of Systems  Positive: Allergic reaction Negative:   Physical Exam  BP (!) 144/110 (BP Location: Right Arm)   Pulse (!) 103   Temp 99 F (37.2 C) (Oral)   Resp 18   Ht 5\' 7"  (1.702 m)   Wt 83.9 kg   SpO2 96%   BMI 28.98 kg/m  Gen:   Awake, no distress   Resp:  Normal effort  MSK:   Moves extremities without difficulty  Skin:  No rash Other:    Medical Decision Making  Medically screening exam initiated at 8:11 PM.  Appropriate orders placed.  Melissa [name] was informed that the remainder of the evaluation will be completed by another provider, this initial triage assessment does not replace that evaluation, and the importance of remaining in the ED until their evaluation is complete.  Possible allergic reaction, no obvious anaphylaxis on exam, speaks in full sentences, no angioedema, no urticaria, emesis.  Appears Stable currently   Melissa [name] A, PA-C 12/25/20 2012    12/27/20, MD 12/26/20 1049

## 2020-12-25 NOTE — ED Triage Notes (Signed)
Patient reports possible allergic reaction to medication she takes for fibromyalgia. She reports throat swelling and generalized hives. States she took benadryl which improved symptoms.

## 2020-12-25 NOTE — Discharge Instructions (Addendum)
You were seen in the emergency department for possible allergic reaction.  You received a dose of steroids.  You will need to continue to use the Benadryl 1 to 2 tablets every 4-6 hours as needed for itching.  Follow-up with your regular doctor.  Return to the emergency department if any worsening or concerning symptoms.

## 2020-12-25 NOTE — ED Provider Notes (Signed)
[facility] DEPT Provider Note   CSN: [id number] Arrival date & time: 12/25/20  1959     History Chief Complaint  Patient presents with   Allergic Reaction    Melissa [name] is a 40 y.o. female.  She is here with a complaint of hives and some shortness of breath that started yesterday.  She thinks this is some type of allergic reaction.  She had a similar event around a year ago and they thought it was attributed to a new food.  She has not eaten anything different or is not on any new medications.  She has been using her inhaler and she took Benadryl.  Symptoms somewhat improved although still having generalized itchiness.  No new soaps or other contacts that she can identify.  The history is provided by the patient.  Allergic Reaction Presenting symptoms: difficulty breathing, difficulty swallowing, itching and rash   Difficulty swallowing:    Severity:  Mild   Onset quality:  Gradual   Duration:  2 days   Timing:  Intermittent   Progression:  Improving Severity:  Moderate Duration:  2 days Prior allergic episodes:  Unable to specify Relieved by:  Nothing Worsened by:  Nothing Ineffective treatments:  Antihistamines     Past Medical History:  Diagnosis Date   Asthma    Fibromyalgia    IBS (irritable bowel syndrome)    Migraine     Patient Active Problem List   Diagnosis Date Noted   Insomnia 09/21/2020   OSA on CPAP 08/10/2020   Hypersomnia with long sleep time, idiopathic 07/07/2020   Chronic fatigue 07/07/2020   OSA (obstructive sleep apnea) 07/07/2020   Intractable migraine with status migrainosus 04/07/2020   Hyperglycemia 11/12/2018   Dyslipidemia 11/12/2018   Depression, major, single episode, moderate ([place]) 09/22/2017   Fibromyalgia 09/22/2017   RHINITIS, ALLERGIC 03/30/2006   Asthma 03/30/2006   IRRITABLE BOWEL SYNDROME 03/30/2006    Past Surgical History:  Procedure Laterality Date   [facility]  2008     OB History      Gravida      Para      Term      Preterm      AB      Living  0      SAB      IAB      Ectopic      Multiple      Live Births              Family History  Problem Relation Age of Onset   Arthritis Mother    Diabetes Mother    High Cholesterol Mother    Migraines Mother    Cancer Father    High blood pressure Father    High Cholesterol Sister    High blood pressure Sister    Kidney disease Maternal Grandmother    Cancer Maternal Grandfather    Heart attack Maternal Grandfather    High Cholesterol Paternal Grandmother    High blood pressure Paternal Grandmother    High blood pressure Paternal Grandfather     Social History   Tobacco Use   Smoking status: Former    Types: Cigars    Quit date: 01/01/2018    Years since quitting: 2.9   Smokeless tobacco: Never   Tobacco comments:    Black and Milds  Vaping Use   Vaping Use: Never used  Substance Use Topics   Alcohol use: Yes    Alcohol/week: 2.0 standard  drinks    Types: 2 Cans of beer per week   Drug use: No    Comment: uses CBD    Home Medications Prior to Admission medications   Medication Sig Start Date End Date Taking? Authorizing Provider  AJOVY 225 MG/1.5ML SOAJ INJECT 225 MG INTO THE SKIN EVERY 30 (THIRTY) DAYS. 04/07/20   Anson Fret, MD  albuterol (PROVENTIL) (2.5 MG/3ML) 0.083% nebulizer solution Take 3 mLs (2.5 mg total) by nebulization every 6 (six) hours as needed for wheezing or shortness of breath. 01/03/19   Ardith Dark, MD  albuterol (VENTOLIN HFA) 108 (90 Base) MCG/ACT inhaler TAKE 2 PUFFS BY MOUTH EVERY 6 HOURS AS NEEDED FOR WHEEZE OR SHORTNESS OF BREATH 12/07/20   Ardith Dark, MD  amitriptyline (ELAVIL) 10 MG tablet TAKE 2 TABLETS BY MOUTH AT BEDTIME. 11/16/20   Ardith Dark, MD  Ascorbic Acid (VITAMIN C) 1000 MG tablet Take 1,000 mg by mouth daily.    [provider]  budesonide-formoterol (SYMBICORT) 80-4.5 MCG/ACT inhaler Inhale 2 puffs into the lungs 2  (two) times daily. 01/03/19   Ardith Dark, MD  CHLOROPHYLL PO Take by mouth.    [provider]  cholecalciferol (VITAMIN D) 1000 units tablet Take 1,000 Units by mouth daily.    [provider]  COD LIVER OIL PO Take by mouth.    [provider]  Cyanocobalamin (VITAMIN B-12 PO) Take 1,000 mcg by mouth daily.    [provider]  dicyclomine (BENTYL) 20 MG tablet Take 1 tablet (20 mg total) by mouth 4 (four) times daily -  before meals and at bedtime. 07/09/20   Ardith Dark, MD  escitalopram (LEXAPRO) 20 MG tablet TAKE 1 TABLET BY MOUTH EVERY DAY 07/02/20   Ardith Dark, MD  meclizine (ANTIVERT) 25 MG tablet Take 1 tablet (25 mg total) by mouth 2 (two) times daily as needed for dizziness. 07/09/20   Ardith Dark, MD  Multiple Vitamin (MULTIVITAMIN) tablet Take 1 tablet by mouth daily.    [provider]  Omega-3 Fatty Acids (FISH OIL) 1000 MG CAPS Take by mouth.    [provider]  OVER THE COUNTER MEDICATION Red beet water mix-in    [provider]  pantoprazole (PROTONIX) 40 MG tablet TAKE 1 TABLET BY MOUTH EVERY DAY 08/04/20   Ardith Dark, MD  promethazine (PHENERGAN) 12.5 MG tablet Take 1 tablet (12.5 mg total) by mouth every 8 (eight) hours as needed for nausea or vomiting. 08/27/20   Ardith Dark, MD  pyridoxine (B-6) 250 MG tablet Take 250 mg by mouth daily.    [provider]  ramelteon (ROZEREM) 8 MG tablet Take 1 tablet (8 mg total) by mouth at bedtime. Patient not taking: Reported on 10/07/2020 09/21/20   Ardith Dark, MD  Riboflavin (B-2-400 PO) Take by mouth.    [provider]  zolpidem (AMBIEN) 10 MG tablet Take 1 tablet (10 mg total) by mouth at bedtime as needed for sleep. 10/13/20   Ardith Dark, MD    Allergies    Tomato, Asa [aspirin], Latex, Penicillins, and Vitamin e  Review of Systems   Review of Systems  Constitutional:  Negative for fever.  HENT:  Positive for trouble  swallowing.   Eyes:  Negative for visual disturbance.  Respiratory:  Positive for shortness of breath.   Cardiovascular:  Negative for chest pain.  Gastrointestinal:  Negative for diarrhea, nausea and vomiting.  Genitourinary:  Negative for dysuria.  Musculoskeletal:  Negative for joint swelling.  Skin:  Positive for itching and rash.  Neurological:  Negative for headaches.   Physical Exam Updated Vital Signs BP (!) 144/110 (BP Location: Right Arm)   Pulse (!) 103   Temp 99 F (37.2 C) (Oral)   Resp 18   Ht 5\' 7"  (1.702 m)   Wt 83.9 kg   SpO2 96%   BMI 28.98 kg/m   Physical Exam Constitutional:      Appearance: Normal appearance. She is well-developed.  HENT:     Head: Normocephalic and atraumatic.     Nose: Nose normal.     Mouth/Throat:     Mouth: Mucous membranes are moist.     Pharynx: Oropharynx is clear. No oropharyngeal exudate or posterior oropharyngeal erythema.  Eyes:     Conjunctiva/sclera: Conjunctivae normal.  Cardiovascular:     Rate and Rhythm: Normal rate and regular rhythm.  Pulmonary:     Effort: Pulmonary effort is normal.     Breath sounds: No wheezing or rhonchi.  Musculoskeletal:     Cervical back: Neck supple.  Skin:    General: Skin is warm and dry.     Findings: Rash present.     Comments: She has a few hives on her trunk and neck.  Neurological:     General: No focal deficit present.     Mental Status: She is alert.     GCS: GCS eye subscore is 4. GCS verbal subscore is 5. GCS motor subscore is 6.    ED Results / Procedures / Treatments   Labs (all labs ordered are listed, but only abnormal results are displayed) Labs Reviewed - No data to display  EKG None  Radiology No results found.  Procedures Procedures   Medications Ordered in ED Medications - No data to display  ED Course  I have reviewed the triage vital signs and the nursing notes.  Pertinent labs & imaging results that were available during my care of the  patient were reviewed by me and considered in my medical decision making (see chart for details).    MDM Rules/Calculators/A&P                          40 year old female here with possible allergic reaction.  Hives and feeling some tightening in her throat and shortness of breath.  No objective findings other than the hives.  No distress.  She is taken Benadryl and was given a dose of steroids.  Cover with steroids for 4 more days.  Return instructions discussed  Final Clinical Impression(s) / ED Diagnoses Final diagnoses:  Allergic reaction, initial encounter  Urticaria    Rx / DC Orders ED Discharge Orders          Ordered    predniSONE (DELTASONE) 20 MG tablet  Daily        12/25/20 2050             Hayden Rasmussen, MD 12/26/20 1024

## 2020-12-25 NOTE — ED Notes (Signed)
An After Visit Summary was printed and given to the patient. Discharge instructions given and no further questions at this time.  

## 2020-12-29 ENCOUNTER — Other Ambulatory Visit: Payer: Self-pay | Admitting: Family Medicine

## 2021-01-13 ENCOUNTER — Ambulatory Visit: Payer: 59 | Admitting: Family Medicine

## 2021-01-16 ENCOUNTER — Other Ambulatory Visit: Payer: Self-pay | Admitting: Family Medicine

## 2021-01-18 ENCOUNTER — Encounter: Payer: Self-pay | Admitting: Family Medicine

## 2021-01-18 ENCOUNTER — Other Ambulatory Visit: Payer: Self-pay | Admitting: Family Medicine

## 2021-01-18 MED ORDER — PROMETHAZINE HCL 12.5 MG PO TABS: 12.5000 mg | ORAL_TABLET | Freq: Three times a day (TID) | ORAL | 0 refills | Status: DC | PRN

## 2021-01-19 MED ORDER — MECLIZINE HCL 25 MG PO TABS: 25.0000 mg | ORAL_TABLET | Freq: Two times a day (BID) | ORAL | 5 refills | Status: DC | PRN

## 2021-02-05 ENCOUNTER — Encounter (HOSPITAL_COMMUNITY): Payer: Self-pay

## 2021-02-05 ENCOUNTER — Other Ambulatory Visit: Payer: Self-pay

## 2021-02-05 ENCOUNTER — Emergency Department (HOSPITAL_COMMUNITY)
Admission: EM | Admit: 2021-02-05 | Discharge: 2021-02-06 | Discharge status: Against Medical Advice | Payer: Commercial Managed Care - HMO | Attending: Emergency Medicine | Admitting: Emergency Medicine

## 2021-02-05 DIAGNOSIS — Z5321 Procedure and treatment not carried out due to patient leaving prior to being seen by health care provider: Secondary | ICD-10-CM | POA: Diagnosis not present

## 2021-02-05 DIAGNOSIS — M79622 Pain in left upper arm: Secondary | ICD-10-CM | POA: Diagnosis present

## 2021-02-05 NOTE — ED Provider Triage Note (Signed)
Emergency Medicine Provider Triage Evaluation Note  Melissa [name] , a 41 y.o. female  was evaluated in triage.  Pt complains of "chronic pain."  Patient reports that pain is all over her body.  History of fibromyalgia but this is a bad flare for her.  Has taken amitriptyline but it is not helping her.  Review of Systems  As above Physical Exam  BP (!) 145/106 (BP Location: Left Arm)    Pulse 96    Temp 98.2 F (36.8 C) (Oral)    Resp 16    Ht 5\' 7"  (1.702 m)    Wt 83.9 kg    SpO2 99%    BMI 28.98 kg/m  Gen:   Awake, no distress   Resp:  Normal effort  MSK:   Moves extremities without difficulty  Other:  Soft-spoken, flat affect.  Medical Decision Making  Medically screening exam initiated at 11:05 PM.  Appropriate orders placed.  Melissa [name] was informed that the remainder of the evaluation will be completed by another provider, this initial triage assessment does not replace that evaluation, and the importance of remaining in the ED until their evaluation is complete.     [name], PA-C 02/05/21 2307

## 2021-02-05 NOTE — ED Triage Notes (Signed)
Pt reports chronic generalized body pains. Hx fibromyalgia.

## 2021-02-06 NOTE — ED Notes (Signed)
Pt called several times for VS check, no answer. Pt will be removed from system

## 2021-02-13 ENCOUNTER — Other Ambulatory Visit: Payer: Self-pay | Admitting: Family Medicine

## 2021-02-13 DIAGNOSIS — F321 Major depressive disorder, single episode, moderate: Secondary | ICD-10-CM

## 2021-02-19 ENCOUNTER — Other Ambulatory Visit: Payer: Self-pay | Admitting: Family Medicine

## 2021-04-06 DIAGNOSIS — Z0289 Encounter for other administrative examinations: Secondary | ICD-10-CM

## 2021-04-12 ENCOUNTER — Telehealth: Payer: Self-pay | Admitting: Adult Health

## 2021-04-12 ENCOUNTER — Other Ambulatory Visit: Payer: Self-pay | Admitting: Neurology

## 2021-04-12 DIAGNOSIS — G43711 Chronic migraine without aura, intractable, with status migrainosus: Secondary | ICD-10-CM

## 2021-04-12 NOTE — Telephone Encounter (Addendum)
Key: [id number]. Ajovy PA done. Awaiting determination from [facility]. Message sent to [facility] w/ [employer].  ?

## 2021-04-12 NOTE — Telephone Encounter (Signed)
Will look into Ajovy PA. We do not do PA for CPAP, that is the DME company.  ?

## 2021-04-12 NOTE — Telephone Encounter (Signed)
Pt's new insurance informed patient need PA for CPAP machine and Ajovy.  ? ?[employer] ?Member ID: [id number] ?Group ID: [id number] ?BIN: [id number] ?PCN: [id number] ?

## 2021-04-26 ENCOUNTER — Telehealth: Payer: Self-pay | Admitting: *Deleted

## 2021-04-26 NOTE — Telephone Encounter (Signed)
Received fax from [facility] stating Ajovy 225 mg/1.5 mL has been approved from 04/12/21-10/22/21. I faxed this to the pharmacy. Received a receipt of confirmation. ? ?

## 2021-05-04 ENCOUNTER — Encounter: Payer: Self-pay | Admitting: Family Medicine

## 2021-05-04 ENCOUNTER — Other Ambulatory Visit: Payer: Self-pay | Admitting: Family Medicine

## 2021-05-04 ENCOUNTER — Other Ambulatory Visit: Payer: Self-pay | Admitting: *Deleted

## 2021-05-04 ENCOUNTER — Other Ambulatory Visit: Payer: Self-pay

## 2021-05-04 ENCOUNTER — Ambulatory Visit: Payer: Managed Care, Other (non HMO) | Admitting: Family Medicine

## 2021-05-04 ENCOUNTER — Ambulatory Visit (INDEPENDENT_AMBULATORY_CARE_PROVIDER_SITE_OTHER): Payer: Managed Care, Other (non HMO) | Admitting: Family Medicine

## 2021-05-04 VITALS — BP 155/107 | HR 85 | Temp 98.7°F | Ht 67.0 in | Wt 186.0 lb

## 2021-05-04 DIAGNOSIS — G8929 Other chronic pain: Secondary | ICD-10-CM | POA: Diagnosis not present

## 2021-05-04 DIAGNOSIS — G43811 Other migraine, intractable, with status migrainosus: Secondary | ICD-10-CM

## 2021-05-04 DIAGNOSIS — R079 Chest pain, unspecified: Secondary | ICD-10-CM

## 2021-05-04 DIAGNOSIS — M79605 Pain in left leg: Secondary | ICD-10-CM

## 2021-05-04 DIAGNOSIS — F321 Major depressive disorder, single episode, moderate: Secondary | ICD-10-CM | POA: Diagnosis not present

## 2021-05-04 DIAGNOSIS — K589 Irritable bowel syndrome without diarrhea: Secondary | ICD-10-CM

## 2021-05-04 DIAGNOSIS — M797 Fibromyalgia: Secondary | ICD-10-CM | POA: Diagnosis not present

## 2021-05-04 DIAGNOSIS — J4520 Mild intermittent asthma, uncomplicated: Secondary | ICD-10-CM | POA: Diagnosis not present

## 2021-05-04 DIAGNOSIS — M79604 Pain in right leg: Secondary | ICD-10-CM

## 2021-05-04 DIAGNOSIS — R739 Hyperglycemia, unspecified: Secondary | ICD-10-CM | POA: Diagnosis not present

## 2021-05-04 DIAGNOSIS — G4700 Insomnia, unspecified: Secondary | ICD-10-CM

## 2021-05-04 DIAGNOSIS — R5382 Chronic fatigue, unspecified: Secondary | ICD-10-CM

## 2021-05-04 LAB — COMPREHENSIVE METABOLIC PANEL
ALT: 17 U/L (ref 0–35)
AST: 17 U/L (ref 0–37)
Albumin: 4.5 g/dL (ref 3.5–5.2)
Alkaline Phosphatase: 44 U/L (ref 39–117)
BUN: 11 mg/dL (ref 6–23)
CO2: 28 mEq/L (ref 19–32)
Calcium: 9.5 mg/dL (ref 8.4–10.5)
Chloride: 102 mEq/L (ref 96–112)
Creatinine, Ser: 0.75 mg/dL (ref 0.40–1.20)
GFR: 98.29 mL/min (ref >60.00)
Glucose, Bld: 108 mg/dL — ABNORMAL HIGH (ref 70–99)
Potassium: 4 mEq/L (ref 3.5–5.1)
Sodium: 136 mEq/L (ref 135–145)
Total Bilirubin: 0.3 mg/dL (ref 0.2–1.2)
Total Protein: 7.4 g/dL (ref 6.0–8.3)

## 2021-05-04 LAB — VITAMIN D 25 HYDROXY (VIT D DEFICIENCY, FRACTURES): VITD: 43.98 ng/mL (ref 30.00–100.00)

## 2021-05-04 LAB — CBC
HCT: 37.5 % (ref 36.0–46.0)
Hemoglobin: 12.3 g/dL (ref 12.0–15.0)
MCHC: 32.8 g/dL (ref 30.0–36.0)
MCV: 86.2 fl (ref 78.0–100.0)
Platelets: 229 10*3/uL (ref 150.0–400.0)
RBC: 4.35 Mil/uL (ref 3.87–5.11)
RDW: 13.4 % (ref 11.5–15.5)
WBC: 4.6 10*3/uL (ref 4.0–10.5)

## 2021-05-04 LAB — VITAMIN B12: Vitamin B-12: 1214 pg/mL — ABNORMAL HIGH (ref 211–911)

## 2021-05-04 LAB — SEDIMENTATION RATE: Sed Rate: 13 mm/hr (ref 0–20)

## 2021-05-04 LAB — TSH: TSH: 1.44 u[IU]/mL (ref 0.35–5.50)

## 2021-05-04 LAB — HEMOGLOBIN A1C: Hgb A1c MFr Bld: 6.1 % (ref 4.6–6.5)

## 2021-05-04 LAB — C-REACTIVE PROTEIN: CRP: <1 mg/dL (ref 0.5–20.0)

## 2021-05-04 MED ORDER — DICYCLOMINE HCL 20 MG PO TABS: 20.0000 mg | ORAL_TABLET | Freq: Three times a day (TID) | ORAL | 5 refills | Status: DC

## 2021-05-04 MED ORDER — PROMETHAZINE HCL 12.5 MG PO TABS: 12.5000 mg | ORAL_TABLET | Freq: Three times a day (TID) | ORAL | 0 refills | Status: DC | PRN

## 2021-05-04 MED ORDER — CYCLOBENZAPRINE HCL ER 15 MG PO CP24: 15.0000 mg | ORAL_CAPSULE | Freq: Every day | ORAL | 5 refills | Status: DC | PRN

## 2021-05-04 MED ORDER — ZOLPIDEM TARTRATE 10 MG PO TABS: 10.0000 mg | ORAL_TABLET | Freq: Every evening | ORAL | 0 refills | Status: DC | PRN

## 2021-05-04 MED ORDER — PREGABALIN 75 MG PO CAPS: 75.0000 mg | ORAL_CAPSULE | Freq: Two times a day (BID) | ORAL | 3 refills | Status: DC

## 2021-05-04 MED ORDER — ZOLPIDEM TARTRATE 10 MG PO TABS
10.0000 mg | ORAL_TABLET | Freq: Every evening | ORAL | 0 refills | Status: DC | PRN
Start: 2021-05-04 — End: 2021-05-04

## 2021-05-04 NOTE — Assessment & Plan Note (Signed)
Recheck A1c 

## 2021-05-04 NOTE — Assessment & Plan Note (Addendum)
Stable. No recent flares.  

## 2021-05-04 NOTE — Assessment & Plan Note (Signed)
Multifactorial though predominantly due to her fibromyalgia.  She has been treating for OSA. ?

## 2021-05-04 NOTE — Assessment & Plan Note (Signed)
It is not clear if her "seizing" represent atypical migraines versus actual seizure disorder.  She is currently following with neurology and is on Ajovy.  Advised her to follow-up with neurology soon. ?

## 2021-05-04 NOTE — Progress Notes (Signed)
? ?Melissa [name] is a 41 y.o. female who presents today for an office visit. ? ?Assessment/Plan:  ?New/Acute Problems: ?Chest Pain ?History is atypical for cardiac etiology.  Could be related to her fibromyalgia though she does have family history and risk factors including prediabetes and dyslipidemia.  Her EKG today does not show any obvious abnormalities.  We will place referral to cardiology for further evaluation ? ?Bilateral leg pain ?More consistent with neuropathic pain.  History not entirely consistent with claudication though we will check ABIs to rule out.  If ABIs are negative would advise her to follow back up with neurology for further evaluation. ? ?Seizure Like Episodes ?Unclear etiology.  Reassuring neuro exam today.  Advised her to follow-up with neurology soon. ? ?Polyarthralgia ?Possibly related to her underlying fibromyalgia though she does have a few indicators possibly consistent with underlying autoimmune disease.  We will check labs today. ? ?Chronic Problems Addressed Today: ?Fibromyalgia ?Symptoms are still not well controlled.  We have her on amitriptyline 20 mg daily though she is not sure if this is made much of a difference.  She still has diffuse body pains and polyarthralgia.  We will check additional inflammatory and autoimmune labs today.  We will be starting Lyrica 75 mg twice daily and refer her to pain management.  She is interested in possible IV infusion to help with her pain.  We will also start extended release cyclobenzaprine.  We will continue current dose of amitriptyline for now.  She will message me in a few weeks via MyChart to let me know how things are going and we can adjust meds as needed.  We will likely be able to stop amitriptyline at some point in the next 1 to 2 weeks. ? ?IRRITABLE BOWEL SYNDROME ?Doing well on Bentyl 20 mg 3 times daily before meals and at bedtime.  Will refill today. ? ?Asthma ?Stable. No recent flares.  ? ?Insomnia ?She is doing well with  Ambien.  Refill was sent in today. ? ?Chronic fatigue ?Multifactorial though predominantly due to her fibromyalgia.  She has been treating for OSA. ? ?Intractable migraine with status migrainosus ?It is not clear if her "seizing" represent atypical migraines versus actual seizure disorder.  She is currently following with neurology and is on Ajovy.  Advised her to follow-up with neurology soon. ? ?Hyperglycemia ?Recheck A1c. ? ?Depression, major, single episode, moderate ([facility]) ?On Lexapro 20 mg daily. ? ? ?  ?Subjective:  ?HPI: ? ?Patient here for follow up.  Please see A/P for status of chronic conditions ? ?Her main concern today is of her myalgia. Her symptoms seems to be uncontrolled. She is currently on Amitriptyline 20 mg nightly. This has not helped with symptoms. Her pain seems to be worsening. She has tried CBD oil with some relief. She would like to be referred to heag pain management for her pain.  Pain is located throughout her body.  She is interested in trying Lyrica. ? ?She also complain pain in both of her legs.  She is worried about decreased blood flow in her legs. She notes her feet has been feeling cold. She noticed blue skin color. Pain is worse when sitting. Has numbness and tingling sensation. Also noticed some bruising in her legs. Denies any leg swelling.  ? ?She also noticed skin rash. Located on face. Comes and goes. No specific treatment tried. She thinks her symptoms are related to Lupus. She would like to be tested for this.  ? ?She is  also had intermittent episodes of " seizing" up.  States that she cannot control her body for several moments.  This will subside.  No convulsions.  She is fully aware when this happens.  No reported weakness or numbness. ? ?She also had some chest wall pain.  Occurred frequently. She described this as "sharp pain". Comes and goes. She denies any chest pain at this time. She would like to be referred to cardiologist. No fever or chills. No nausea or  vomiting. Denies any precipitating events.  She has shortness of breath at baseline that is unchanged.  No current symptoms. ? ?   ?  ?Objective:  ?Physical Exam: ?BP (!) 155/107 (BP Location: Right Arm)   Pulse 85   Temp 98.7 ?F (37.1 ?C) (Temporal)   Ht 5\' 7"  (1.702 m)   Wt 186 lb (84.4 kg)   SpO2 98%   BMI 29.13 kg/m?   ?Gen: No acute distress, resting comfortably ?CV: Regular rate and rhythm with no murmurs appreciated ?Pulm: Normal work of breathing, clear to auscultation bilaterally with no crackles, wheezes, or rhonchi ?Neuro: CN 2 through 12 intact. ?Psych: Normal affect and thought content ? ?EKG: Normal sinus rhythm.  No ischemic changes. ? ?   ? ? ?I,Savera Zaman,acting as a for Neurosurgeon, MD.,have documented all relevant documentation on the behalf of Jacquiline Doe, MD,as directed by  Jacquiline Doe, MD while in the presence of Jacquiline Doe, MD.  ? ?I, Jacquiline Doe, MD, have reviewed all documentation for this visit. The documentation on 05/04/21 for the exam, diagnosis, procedures, and orders are all accurate and complete. ? ? ?Time Spent: ?45 minutes of total time was spent on the date of the encounter performing the following actions: chart review prior to seeing the patient, obtaining history, performing a medically necessary exam, counseling on the treatment plan including medication changes and referrals, placing orders, and documenting in our EHR.  ? ?07/04/21. Katina Degree, MD ?05/04/2021 12:36 PM  ? ?

## 2021-05-04 NOTE — Assessment & Plan Note (Signed)
On Lexapro 20 mg daily. 

## 2021-05-04 NOTE — Assessment & Plan Note (Addendum)
Symptoms are still not well controlled.  We have her on amitriptyline 20 mg daily though she is not sure if this is made much of a difference.  She still has diffuse body pains and polyarthralgia.  We will check additional inflammatory and autoimmune labs today.  We will be starting Lyrica 75 mg twice daily and refer her to pain management.  She is interested in possible IV infusion to help with her pain.  We will also start extended release cyclobenzaprine.  We will continue current dose of amitriptyline for now.  She will message me in a few weeks via MyChart to let me know how things are going and we can adjust meds as needed.  We will likely be able to stop amitriptyline at some point in the next 1 to 2 weeks. ?

## 2021-05-04 NOTE — Patient Instructions (Addendum)
It was very nice to see you today! ? ?We will check blood work today. ? ?Your EKG today is normal.  I will refer you to see a cardiologist. ? ?I am concerned that you may have a pinched nerve in your legs.  We will check an ultrasound to make sure that your blood flow is okay.  If it does not like gabapentin sure we may have you get back into see neurologist.  Please call your neurologist soon about the seizing episodes that she been having. ? ?We will start Lyrica.  Please send me a message in a couple of weeks to let me know how this is working for you. ? ?I will refer you to see the pain management specialist. ? ?I will refill your other medications. ? ?Take care, ?Dr Jerline Pain ? ?PLEASE NOTE: ? ?If you had any lab tests please let us know if you have not heard back within a few days. You may see your results on mychart before we have a chance to review them but we will give you a call once they are reviewed by [place]. If we ordered any referrals today, please let us know if you have not heard from their office within the next week.  ? ?Please try these tips to maintain a healthy lifestyle: ? ?Eat at least 3 REAL meals and 1-2 snacks per day.  Aim for no more than 5 hours between eating.  If you eat breakfast, please do so within one hour of getting up.  ? ?Each meal should contain half fruits/vegetables, one quarter protein, and one quarter carbs (no bigger than a computer mouse) ? ?Cut down on sweet beverages. This includes juice, soda, and sweet tea.  ? ?Drink at least 1 glass of water with each meal and aim for at least 8 glasses per day ? ?Exercise at least 150 minutes every week.   ?

## 2021-05-04 NOTE — Progress Notes (Incomplete)
? ?  Marlita Keil is a 41 y.o. female who presents today for an office visit. ? ?Assessment/Plan:  ?New/Acute Problems: ?*** ? ?Chronic Problems Addressed Today: ?No problem-specific Assessment & Plan notes found for this encounter. ? ? ?  ?Subjective:  ?HPI: ? ?Patient here to follow up on fibromyalgia.   ? ?   ?  ?Objective:  ?Physical Exam: ?There were no vitals taken for this visit.  ?Gen: No acute distress, resting comfortably*** ?CV: Regular rate and rhythm with no murmurs appreciated ?Pulm: Normal work of breathing, clear to auscultation bilaterally with no crackles, wheezes, or rhonchi ?Neuro: Grossly normal, moves all extremities ?Psych: Normal affect and thought content ? ?   ? ? ?I,Savera Zaman,acting as a Neurosurgeon for Jacquiline Doe, MD.,have documented all relevant documentation on the behalf of Jacquiline Doe, MD,as directed by  Jacquiline Doe, MD while in the presence of Jacquiline Doe, MD.  ? ?*** ?Katina Degree. [name], MD ?05/04/2021 8:36 AM  ? ?

## 2021-05-04 NOTE — Assessment & Plan Note (Signed)
Doing well on Bentyl 20 mg 3 times daily before meals and at bedtime.  Will refill today. ?

## 2021-05-04 NOTE — Assessment & Plan Note (Signed)
She is doing well with Ambien.  Refill was sent in today. ?

## 2021-05-05 ENCOUNTER — Telehealth: Payer: Self-pay | Admitting: Family Medicine

## 2021-05-05 NOTE — Telephone Encounter (Signed)
Returned [employer] call and no prior auth required per [place]  ?

## 2021-05-05 NOTE — Telephone Encounter (Signed)
Damita Dunnings with [facility] is asking for a call regarding pt. Please call at [phone] ?

## 2021-05-06 ENCOUNTER — Ambulatory Visit: Payer: Managed Care, Other (non HMO) | Admitting: Internal Medicine

## 2021-05-06 LAB — ANA: Anti Nuclear Antibody (ANA): NEGATIVE

## 2021-05-06 NOTE — Progress Notes (Signed)
Please inform patient of the following: ? ?Her labs do not show significant abnormalities. We do not need to make any changes to her treatment plan at this time. We can recheck in a year.  ? ?I would like for her to check in with [place] in a few weeks to let us know how her medication changes are working. ? ?Katina Degree. [name], MD ?05/06/2021 1:02 PM  ?

## 2021-05-13 ENCOUNTER — Encounter: Payer: Self-pay | Admitting: Family Medicine

## 2021-05-13 DIAGNOSIS — M25569 Pain in unspecified knee: Secondary | ICD-10-CM

## 2021-05-13 DIAGNOSIS — M329 Systemic lupus erythematosus, unspecified: Secondary | ICD-10-CM

## 2021-05-13 DIAGNOSIS — M797 Fibromyalgia: Secondary | ICD-10-CM

## 2021-05-17 ENCOUNTER — Inpatient Hospital Stay (HOSPITAL_COMMUNITY): Admission: RE | Admit: 2021-05-17 | Payer: Managed Care, Other (non HMO) | Source: Ambulatory Visit

## 2021-05-24 ENCOUNTER — Ambulatory Visit (HOSPITAL_COMMUNITY)
Admission: RE | Admit: 2021-05-24 | Discharge: 2021-05-24 | Disposition: A | Discharge status: Home / Self Care | Payer: Commercial Managed Care - HMO | Source: Ambulatory Visit | Attending: Family Medicine | Admitting: Family Medicine

## 2021-05-24 DIAGNOSIS — M79604 Pain in right leg: Secondary | ICD-10-CM | POA: Insufficient documentation

## 2021-05-24 DIAGNOSIS — M79605 Pain in left leg: Secondary | ICD-10-CM | POA: Diagnosis not present

## 2021-05-26 ENCOUNTER — Ambulatory Visit (INDEPENDENT_AMBULATORY_CARE_PROVIDER_SITE_OTHER): Payer: Managed Care, Other (non HMO) | Admitting: Orthopaedic Surgery

## 2021-05-26 ENCOUNTER — Ambulatory Visit (INDEPENDENT_AMBULATORY_CARE_PROVIDER_SITE_OTHER): Payer: Managed Care, Other (non HMO)

## 2021-05-26 VITALS — Ht 67.0 in | Wt 186.0 lb

## 2021-05-26 DIAGNOSIS — M1712 Unilateral primary osteoarthritis, left knee: Secondary | ICD-10-CM | POA: Diagnosis not present

## 2021-05-26 DIAGNOSIS — M25562 Pain in left knee: Secondary | ICD-10-CM

## 2021-05-26 DIAGNOSIS — G8929 Other chronic pain: Secondary | ICD-10-CM

## 2021-05-26 NOTE — Progress Notes (Signed)
The patient is sent to me by Dr. Jacquiline Doe to evaluate and treat chronic left knee pain.  Everything has been tried thus far for her knee.  She is 41 years old and when we say everything is been tried she has had outpatient physical therapy for her right knee.  She uses a cane in her opposite hand appropriately.  She is worked on [employer].  She has had steroid injections in that knee as well as hyaluronic acid injections in the knee.  She still feels like the knee gives out on her and is not supporting her well.  She does have a history of fibromyalgia but this may not be related to what is going on with her knee.  She is never had an injury to that knee and has never had surgery on the knee but the pain is getting worse and instability is getting worse for her. ? ?Examination her left knee shows no effusion today.  There is slight varus malalignment.  She is very tender throughout the exam in terms of flexion and extension and stressing the ligaments of the knee.  It is hard to get a good ligamentous or McMurray's exam on her given the pain she is having with that knee. ? ?2 views of the left knee show well-maintained medial lateral compartments.  The patellofemoral compartment shows just minimal arthritic changes.  There is no effusion and no evidence of fracture. ? ?At this point given her instability symptoms and the failure of all modalities of conservative treatment for her left knee, a MRI is medically warranted to rule out a meniscal tear or ligamentous disruption.  We will see her back after the MRI.  She agrees with this treatment plan. ?

## 2021-05-26 NOTE — Progress Notes (Signed)
Please inform patient of the following: ? ?Her vascular ultrasound was normal. She has no issues with blood flow in her legs.

## 2021-05-27 ENCOUNTER — Other Ambulatory Visit: Payer: Self-pay

## 2021-05-27 DIAGNOSIS — G8929 Other chronic pain: Secondary | ICD-10-CM

## 2021-05-30 ENCOUNTER — Other Ambulatory Visit: Payer: Self-pay | Admitting: Family Medicine

## 2021-05-30 DIAGNOSIS — F321 Major depressive disorder, single episode, moderate: Secondary | ICD-10-CM

## 2021-05-31 ENCOUNTER — Telehealth: Payer: Self-pay

## 2021-05-31 NOTE — Telephone Encounter (Signed)
Spoke with patient this morning; see lab note.  ?

## 2021-05-31 NOTE — Telephone Encounter (Signed)
Patient has called in regard to lab results.  Patient states she had a vm to call back for lab results.  However, she was never spoken to.  I have read patient Dr. Rhys Martini response in regard.  Patient states she understood.  Patient did state that she did send [name] a message via mychart with how medication was going.

## 2021-06-03 ENCOUNTER — Other Ambulatory Visit: Payer: Self-pay | Admitting: Family Medicine

## 2021-06-03 DIAGNOSIS — F321 Major depressive disorder, single episode, moderate: Secondary | ICD-10-CM

## 2021-06-06 ENCOUNTER — Other Ambulatory Visit: Payer: Commercial Managed Care - HMO

## 2021-06-07 NOTE — Progress Notes (Signed)
?Cardiology Office Note:   ? ?Date:  06/08/2021  ? ?ID:  Melissa [name], DOB 1980-03-30, MRN [id number] ? ?PCP:  Ardith Dark, MD ?  ?[facility] Providers ?Cardiologist:  None    ? ?Referring MD: Ardith Dark, MD  ? ?No chief complaint on file. ? ? ?History of Present Illness:   ? ?Melissa [name] is a 41 y.o. female with a hx of hyperlipidemia, prediabetes, asthma, fibromyalgia, and IBS, here for the evaluation of chest pain. She saw Dr. [name] 05/04/2021 and reported sharp chest pain that occurred frequently. Her chest pain was thought to be atypical for cardiac etiology, possibly related to fibromyalgia. However, due to family history and risk factors including prediabetes and dyslipidemia she was referred to cardiology for further evaluation. She also noted bilateral leg pain thought to be more consistent with neuropathic pain. ABIs were ordered and were negative. ? ?Today, she notes having intermittent, sharp piercing chest pain localized to her right side but non-radiating. She describes the pain as feeling like "it hits you in the chest". She may be sitting down at rest and then suddenly "need to keel over" because of the pain. She will hold the area and wait for the pain to subside. There is no relief with pressing into her chest. Normally she has a low blood pressure at home. Any higher blood pressures she would attribute to her chronic pain. She was diagnosed with fibromyalgia in 2017. Most of the time she sleeps due to chronic fatigue and pain. When she is active, she becomes short of breath but denies any exertional chest pain. Since she does not have much time to exercise, she focuses on maintaining a healthy diet. Regarding her diet she typically cooks her meals at home, often with fruits and vegetables. In a given month, she may eat a steak once to keep her iron levels stable. She also only has fried foods once a month. She is a former tobacco smoker. She uses her CPAP religiously; has been on it for  about a year now. Of note she is allergic to contrast dye; she develops hives. She denies any palpitations, or peripheral edema. No lightheadedness, headaches, syncope, orthopnea, or PND. ? ? ?Past Medical History:  ?Diagnosis Date  ? Asthma   ? Chest pain of uncertain etiology 06/08/2021  ? Fibromyalgia   ? IBS (irritable bowel syndrome)   ? Migraine   ? Pure hypercholesterolemia 06/08/2021  ? ? ?Past Surgical History:  ?Procedure Laterality Date  ? [facility]  2008  ? ? ?Current Medications: ?Current Meds  ?Medication Sig  ? AJOVY 225 MG/1.5ML SOAJ INJECT 225 MG(1.5 ML) INTO THE SKIN EVERY 30 DAYS.  ? albuterol (PROVENTIL) (2.5 MG/3ML) 0.083% nebulizer solution Take 3 mLs (2.5 mg total) by nebulization every 6 (six) hours as needed for wheezing or shortness of breath.  ? albuterol (VENTOLIN HFA) 108 (90 Base) MCG/ACT inhaler TAKE 2 PUFFS BY MOUTH EVERY 6 HOURS AS NEEDED FOR WHEEZE OR SHORTNESS OF BREATH  ? amitriptyline (ELAVIL) 10 MG tablet TAKE 2 TABLETS BY MOUTH AT BEDTIME  ? Ascorbic Acid (VITAMIN C) 1000 MG tablet Take 1,000 mg by mouth daily.  ? budesonide-formoterol (SYMBICORT) 80-4.5 MCG/ACT inhaler Inhale 2 puffs into the lungs 2 (two) times daily.  ? CHLOROPHYLL PO Take by mouth.  ? cholecalciferol (VITAMIN D) 1000 units tablet Take 1,000 Units by mouth daily.  ? COD LIVER OIL PO Take by mouth.  ? Cyanocobalamin (VITAMIN B-12 PO) Take 1,000 mcg by mouth  daily.  ? cyclobenzaprine (FLEXERIL) 10 MG tablet TAKE 1 TABLET BY MOUTH THREE TIMES A DAY AS NEEDED FOR MUSCLE SPASMS  ? dicyclomine (BENTYL) 20 MG tablet Take 1 tablet (20 mg total) by mouth 4 (four) times daily -  before meals and at bedtime.  ? escitalopram (LEXAPRO) 20 MG tablet TAKE 1 TABLET BY MOUTH EVERY DAY  ? meclizine (ANTIVERT) 25 MG tablet TAKE 1 TABLET (25 MG TOTAL) BY MOUTH 2 (TWO) TIMES DAILY AS NEEDED FOR DIZZINESS.  ? Multiple Vitamin (MULTIVITAMIN) tablet Take 1 tablet by mouth daily.  ? Omega-3 Fatty Acids (FISH OIL) 1000 MG CAPS Take by mouth.   ? OVER THE COUNTER MEDICATION Red beet water mix-in  ? pantoprazole (PROTONIX) 40 MG tablet TAKE 1 TABLET BY MOUTH EVERY DAY  ? pregabalin (LYRICA) 75 MG capsule Take 1 capsule (75 mg total) by mouth 2 (two) times daily.  ? promethazine (PHENERGAN) 12.5 MG tablet Take 1 tablet (12.5 mg total) by mouth every 8 (eight) hours as needed for nausea or vomiting.  ? pyridoxine (B-6) 250 MG tablet Take 250 mg by mouth daily.  ? Riboflavin (B-2-400 PO) Take by mouth.  ? zolpidem (AMBIEN) 10 MG tablet TAKE 1 TABLET (10 MG TOTAL) BY MOUTH AT BEDTIME AS NEEDED. FOR SLEEP  ? ?Current Facility-Administered Medications for the 06/08/21 encounter (Office Visit) with Chilton Siandolph, Delina Kruczek, MD  ?Medication  ? Fremanezumab-vfrm SOSY 25 mg  ?  ? ?Allergies:   Tomato, Asa [aspirin], Iodinated contrast media, Latex, Penicillins, and Vitamin e  ? ?Social History  ? ?Socioeconomic History  ? Marital status: Single  ?  Spouse name: Not on file  ? Number of children: 0  ? Years of education: Not on file  ? Highest education level: Bachelor's degree (e.g., BA, AB, BS)  ?Occupational History  ? Occupation: clerk  ?  Employer: [employer]  ?Tobacco Use  ? Smoking status: Former  ?  Types: Cigars  ?  Quit date: 01/01/2018  ?  Years since quitting: 3.4  ? Smokeless tobacco: Never  ? Tobacco comments:  ?  Black and Milds  ?Vaping Use  ? Vaping Use: Never used  ?Substance and Sexual Activity  ? Alcohol use: Yes  ?  Alcohol/week: 2.0 standard drinks  ?  Types: 2 Cans of beer per week  ? Drug use: No  ?  Comment: uses CBD  ? Sexual activity: Not Currently  ?Other Topics Concern  ? Not on file  ?Social History Narrative  ? Patient is right-handed. She lives in a single level home. Her mother lives with her. She does not exercise.  ? Caffeine: Kombucha  ? ?Social Determinants of Health  ? ?Financial Resource Strain: Low Risk   ? Difficulty of Paying Living Expenses: Not hard at all  ?Food Insecurity: No Food Insecurity  ? Worried About Programme researcher, broadcasting/film/videounning Out of Food  in the Last Year: Never true  ? Ran Out of Food in the Last Year: Never true  ?Transportation Needs: No Transportation Needs  ? Lack of Transportation (Medical): No  ? Lack of Transportation (Non-Medical): No  ?Physical Activity: Inactive  ? Days of Exercise per Week: 0 days  ? Minutes of Exercise per Session: 0 min  ?Stress: Not on file  ?Social Connections: Not on file  ?  ? ?Family History: ?The patient's family history includes Arthritis in her mother; Cancer in her father and maternal grandfather; Diabetes in her mother; Heart disease in her maternal grandfather and mother; High Cholesterol  in her mother, paternal grandmother, and sister; High blood pressure in her father, paternal grandfather, paternal grandmother, and sister; Kidney disease in her maternal grandmother; Migraines in her mother. ? ?ROS:   ?Please see the history of present illness.    ?(+) Chest pain ?(+) Shortness of breath ?(+) Myalgias ?All other systems reviewed and are negative. ? ?EKGs/Labs/Other Studies Reviewed:   ? ?The following studies were reviewed today: ? ?ABI Doppler 05/24/2021: ?Summary:  ?Right: Resting right ankle-brachial index is within normal range. No  ?evidence of significant right lower extremity arterial disease. The right  ?toe-brachial index is normal.  ? ?Left: Resting left ankle-brachial index is within normal range. No  ?evidence of significant left lower extremity arterial disease. The left  ?toe-brachial index is normal.  ? ?CTA Chest 06/22/2011 [facility]): ?Findings:  ? ?The aorta is seen without any evidence of aneurysm, dissection, or aortic  ?injury. The thyroid is within normal limits. The tracheobronchial tree is  ?within normal limits. The mediastinum is visualized without any mass or  ?collection. The heart is stable  in size.  ? ?There is no pulmonary embolus. There is no pneumothorax. There is no  ?pleural effusion. The lungs are clear bilaterally. Osseous structures  ?within normal limits.  Subcutaneous tissues are within normal limits.  ? ?IMPRESSION  ?Impression:  ? ?Unremarkable CT examination of the chest. No evidence of pulmonary  ?embolism.  ? ? ?EKG:   EKG is personally reviewed. ?06/08/2021

## 2021-06-08 ENCOUNTER — Encounter (HOSPITAL_BASED_OUTPATIENT_CLINIC_OR_DEPARTMENT_OTHER): Payer: Self-pay | Admitting: Cardiovascular Disease

## 2021-06-08 ENCOUNTER — Ambulatory Visit (INDEPENDENT_AMBULATORY_CARE_PROVIDER_SITE_OTHER): Payer: Commercial Managed Care - HMO | Admitting: Cardiovascular Disease

## 2021-06-08 DIAGNOSIS — Z9989 Dependence on other enabling machines and devices: Secondary | ICD-10-CM

## 2021-06-08 DIAGNOSIS — G4733 Obstructive sleep apnea (adult) (pediatric): Secondary | ICD-10-CM | POA: Diagnosis not present

## 2021-06-08 DIAGNOSIS — R079 Chest pain, unspecified: Secondary | ICD-10-CM | POA: Diagnosis not present

## 2021-06-08 DIAGNOSIS — E7800 Pure hypercholesterolemia, unspecified: Secondary | ICD-10-CM | POA: Diagnosis not present

## 2021-06-08 HISTORY — DX: Pure hypercholesterolemia, unspecified: E78.00

## 2021-06-08 HISTORY — DX: Chest pain, unspecified: R07.9

## 2021-06-08 NOTE — Assessment & Plan Note (Signed)
LDL and triglycerides have both been elevated.  Her diet is pretty good.  We did discuss importance of increasing her exercise.  Fibromyalgia has limited her ability to do so.  We will check lipids, CMP, and and an LP(a) today. ?

## 2021-06-08 NOTE — Assessment & Plan Note (Signed)
Continue CPAP.  

## 2021-06-08 NOTE — Patient Instructions (Addendum)
Medication Instructions:  ?Your physician recommends that you continue on your current medications as directed. Please refer to the Current Medication list given to you today.  ? ?*If you need a refill on your cardiac medications before your next appointment, please call your pharmacy* ? ?Lab Work: ?FASTING LP/CMET/LPa TODAY ? ?If you have labs (blood work) drawn today and your tests are completely normal, you will receive your results only by: ?MyChart Message (if you have MyChart) OR ?A paper copy in the mail ?If you have any lab test that is abnormal or we need to change your treatment, we will call you to review the results. ? ?Testing/Procedures: ?CALCIUM SCORE, THIS WILL COST $99 OUT OF POCKET  ? ?Your physician has requested that you have en exercise stress myoview. For further information please visit [email or web address] Please follow instruction sheet, as given. ? ?Follow-Up: ?At [facility], you and your health needs are our priority.  As part of our continuing mission to provide you with exceptional heart care, we have created designated Provider Care Teams.  These Care Teams include your primary Cardiologist (physician) and Advanced Practice Providers (APPs -  Physician Assistants and Nurse Practitioners) who all work together to provide you with the care you need, when you need it. ? ?We recommend signing up for the patient portal called "MyChart".  Sign up information is provided on this After Visit Summary.  MyChart is used to connect with patients for Virtual Visits (Telemedicine).  Patients are able to view lab/test results, encounter notes, upcoming appointments, etc.  Non-urgent messages can be sent to your provider as well.   ?To learn more about what you can do with MyChart, go to [email or web address].   ? ?Your next appointment:   ? ?1-2 MONTHS WITH CAITLIN W NP  ? ?OTHER INSTRUCTIONS:  ? ?Consider the following: ?Dr. Abbe Amsterdam ?Dr. French Ana Mclean-Scocuzza ?

## 2021-06-08 NOTE — Assessment & Plan Note (Signed)
She has been experiencing atypical chest pain at rest.  I suspect that this is nonischemic and related to her fibromyalgia.  It never occurs with exertion.  However she does not get much exercise, so we cannot be sure.  We did discuss getting a coronary CTA.  However given her contrast dye allergy we will avoid that.  We will get an exercise Myoview.  She does have some mild ST abnormalities that are nonspecific but may affect her stress testing.  Therefore we are getting the Myoview as opposed to an ETT.  We will also add a coronary calcium score to better understand how to manage her lipids. ?

## 2021-06-09 LAB — LIPID PANEL
Chol/HDL Ratio: 4.9 ratio — ABNORMAL HIGH (ref 0.0–4.4)
Cholesterol, Total: 243 mg/dL — ABNORMAL HIGH (ref 100–199)
HDL: 50 mg/dL (ref >39)
LDL Chol Calc (NIH): 173 mg/dL — ABNORMAL HIGH (ref 0–99)
Triglycerides: 114 mg/dL (ref 0–149)
VLDL Cholesterol Cal: 20 mg/dL (ref 5–40)

## 2021-06-09 LAB — COMPREHENSIVE METABOLIC PANEL
ALT: 18 IU/L (ref 0–32)
AST: 18 IU/L (ref 0–40)
Albumin/Globulin Ratio: 1.5 (ref 1.2–2.2)
Albumin: 4.7 g/dL (ref 3.8–4.8)
Alkaline Phosphatase: 51 IU/L (ref 44–121)
BUN/Creatinine Ratio: 6 — ABNORMAL LOW (ref 9–23)
BUN: 5 mg/dL — ABNORMAL LOW (ref 6–24)
Bilirubin Total: 0.3 mg/dL (ref 0.0–1.2)
CO2: 19 mmol/L — ABNORMAL LOW (ref 20–29)
Calcium: 9.6 mg/dL (ref 8.7–10.2)
Chloride: 102 mmol/L (ref 96–106)
Creatinine, Ser: 0.83 mg/dL (ref 0.57–1.00)
Globulin, Total: 3.2 g/dL (ref 1.5–4.5)
Glucose: 112 mg/dL — ABNORMAL HIGH (ref 70–99)
Potassium: 4.6 mmol/L (ref 3.5–5.2)
Sodium: 136 mmol/L (ref 134–144)
Total Protein: 7.9 g/dL (ref 6.0–8.5)
eGFR: 90 mL/min/{1.73_m2} (ref >59)

## 2021-06-09 LAB — LIPOPROTEIN A (LPA): Lipoprotein (a): 83.3 nmol/L — ABNORMAL HIGH (ref <75.0)

## 2021-06-10 ENCOUNTER — Telehealth: Payer: Self-pay | Admitting: Family Medicine

## 2021-06-10 NOTE — Telephone Encounter (Signed)
Ok with me. ? ?Melissa [name]. [name], MD ?06/10/2021 10:38 AM  ? ?

## 2021-06-10 NOTE — Telephone Encounter (Signed)
Pt requesting to switch from Dr. Jacquiline Doe, [facility] to Dr. Abbe Amsterdam, [facility] to be at same practice with Mother. ? ?Pt is requesting an expeditious transfer so she can get on the schedule quickly.  ?

## 2021-06-10 NOTE — Telephone Encounter (Signed)
See Dr Parker note  

## 2021-06-11 NOTE — Telephone Encounter (Signed)
Unfortunately, unable to accept pt at this time. ?

## 2021-06-14 NOTE — Telephone Encounter (Signed)
Vml for pt to call back so that we can explain dr Fredderick Phenix decision.  ?

## 2021-06-14 NOTE — Telephone Encounter (Signed)
Explained to pt that Dr. Volanda Napoleon is unable to accept at this time.  ? ?Pt states the [place] office and nurse said that she could be taken on and will call over to that location for an explanation. ?

## 2021-06-21 ENCOUNTER — Encounter: Payer: Self-pay | Admitting: Physician Assistant

## 2021-06-21 ENCOUNTER — Ambulatory Visit (INDEPENDENT_AMBULATORY_CARE_PROVIDER_SITE_OTHER): Payer: Commercial Managed Care - HMO | Admitting: Physician Assistant

## 2021-06-21 VITALS — BP 120/80 | HR 72 | Ht 60.0 in | Wt 181.6 lb

## 2021-06-21 DIAGNOSIS — R202 Paresthesia of skin: Secondary | ICD-10-CM

## 2021-06-21 DIAGNOSIS — R5382 Chronic fatigue, unspecified: Secondary | ICD-10-CM

## 2021-06-21 DIAGNOSIS — M797 Fibromyalgia: Secondary | ICD-10-CM | POA: Diagnosis not present

## 2021-06-21 DIAGNOSIS — M2550 Pain in unspecified joint: Secondary | ICD-10-CM | POA: Insufficient documentation

## 2021-06-21 DIAGNOSIS — R200 Anesthesia of skin: Secondary | ICD-10-CM | POA: Diagnosis not present

## 2021-06-21 NOTE — Patient Instructions (Signed)
You will get a call to schedule an appointment with Neurosurgery

## 2021-06-21 NOTE — Assessment & Plan Note (Addendum)
Stable, recent ANA, ESR, CRP, Vitamin D all normal, no rheumatoid factor blood results found, will check today and refer to [facility] for further evaluation

## 2021-06-21 NOTE — Assessment & Plan Note (Signed)
recent thyroid and CBC lab work

## 2021-06-21 NOTE — Assessment & Plan Note (Signed)
Stable, recent vitamin b 12 labwork normal

## 2021-06-21 NOTE — Progress Notes (Signed)
New Patient Office Visit  Subjective:  Patient ID: Melissa RobRuby Borromeo, female    DOB: 1980/06/10  Age: 41 y.o. MRN: [id number]  CC:  Chief Complaint  Patient presents with   Acute Visit    New pt get est. She has fibromyalgia and previous Dr has not been treating it properly. Wants to see if can be seen by a rheumatologist.     HPI Melissa [name] presents to establish care; reports that she has previously been diagnosed with fibromyalgia and chronic fatigue; reports that she has pain in joints and muscles all over her body, with some numbness and tingling since 2017; denies injury or recent falls to explain it; in 2018 had a fall when left knee gave out, did physical therapy, steroid injections, and gel injections for left knee pain and has follow up with Ortho; states she was told that she will need a knee replacement at some point; hx/o maternal grandmother and 2 maternal aunts with rheumatoid arthritis; states [facility] that didn't help her pain; reports that she has pain all of the time but doesn't take OTC pain; states she doesn't tolerate opioids; states she was previously referred to another Rheumatologist and a Pain Management specialist but states no appointment for evaluation was ever made; states she would like to see a Specialist who can help her with her fibromyalgia.   Outpatient Encounter Medications as of 06/21/2021  Medication Sig   AJOVY 225 MG/1.5ML SOAJ INJECT 225 MG(1.5 ML) INTO THE SKIN EVERY 30 DAYS.   albuterol (PROVENTIL) (2.5 MG/3ML) 0.083% nebulizer solution Take 3 mLs (2.5 mg total) by nebulization every 6 (six) hours as needed for wheezing or shortness of breath.   albuterol (VENTOLIN HFA) 108 (90 Base) MCG/ACT inhaler TAKE 2 PUFFS BY MOUTH EVERY 6 HOURS AS NEEDED FOR WHEEZE OR SHORTNESS OF BREATH   Ascorbic Acid (VITAMIN C) 1000 MG tablet Take 1,000 mg by mouth daily.   budesonide-formoterol (SYMBICORT) 80-4.5 MCG/ACT inhaler Inhale 2  puffs into the lungs 2 (two) times daily.   CHLOROPHYLL PO Take by mouth.   cholecalciferol (VITAMIN D) 1000 units tablet Take 1,000 Units by mouth daily.   COD LIVER OIL PO Take by mouth.   cyclobenzaprine (FLEXERIL) 10 MG tablet TAKE 1 TABLET BY MOUTH THREE TIMES A DAY AS NEEDED FOR MUSCLE SPASMS   dicyclomine (BENTYL) 20 MG tablet Take 1 tablet (20 mg total) by mouth 4 (four) times daily -  before meals and at bedtime.   escitalopram (LEXAPRO) 20 MG tablet TAKE 1 TABLET BY MOUTH EVERY DAY   meclizine (ANTIVERT) 25 MG tablet TAKE 1 TABLET (25 MG TOTAL) BY MOUTH 2 (TWO) TIMES DAILY AS NEEDED FOR DIZZINESS.   Multiple Vitamin (MULTIVITAMIN) tablet Take 1 tablet by mouth daily.   Omega-3 Fatty Acids (FISH OIL) 1000 MG CAPS Take by mouth.   pantoprazole (PROTONIX) 40 MG tablet TAKE 1 TABLET BY MOUTH EVERY DAY   pregabalin (LYRICA) 75 MG capsule Take 1 capsule (75 mg total) by mouth 2 (two) times daily.   promethazine (PHENERGAN) 12.5 MG tablet Take 1 tablet (12.5 mg total) by mouth every 8 (eight) hours as needed for nausea or vomiting.   Riboflavin (B-2-400 PO) Take by mouth.   zolpidem (AMBIEN) 10 MG tablet TAKE 1 TABLET (10 MG TOTAL) BY MOUTH AT BEDTIME AS NEEDED. FOR SLEEP   [DISCONTINUED] amitriptyline (ELAVIL) 10 MG tablet TAKE 2 TABLETS BY MOUTH AT BEDTIME (Patient not taking: Reported on  06/21/2021)   [DISCONTINUED] Cyanocobalamin (VITAMIN B-12 PO) Take 1,000 mcg by mouth daily. (Patient not taking: Reported on 06/21/2021)   [DISCONTINUED] OVER THE COUNTER MEDICATION Red beet water mix-in (Patient not taking: Reported on 06/21/2021)   [DISCONTINUED] pyridoxine (B-6) 250 MG tablet Take 250 mg by mouth daily. (Patient not taking: Reported on 06/21/2021)   Facility-Administered Encounter Medications as of 06/21/2021  Medication   Fremanezumab-vfrm SOSY 25 mg    Past Medical History:  Diagnosis Date   Asthma    Chest pain of uncertain etiology 06/08/2021   Fibromyalgia    IBS (irritable  bowel syndrome)    Migraine    Pure hypercholesterolemia 06/08/2021    Past Surgical History:  Procedure Laterality Date   [facility]  2008    Family History  Problem Relation Age of Onset   Heart disease Mother    Arthritis Mother    Diabetes Mother    High Cholesterol Mother    Migraines Mother    Cancer Father    High blood pressure Father    High Cholesterol Sister    High blood pressure Sister    Kidney disease Maternal Grandmother    Heart disease Maternal Grandfather    Cancer Maternal Grandfather    High Cholesterol Paternal Grandmother    High blood pressure Paternal Grandmother    High blood pressure Paternal Grandfather     Social History   Socioeconomic History   Marital status: Single    Spouse name: Not on file   Number of children: 0   Years of education: Not on file   Highest education level: Bachelor's degree (e.g., BA, AB, BS)  Occupational History   Occupation: Marine scientist: [employer]  Tobacco Use   Smoking status: Former    Types: Cigars    Quit date: 01/01/2018    Years since quitting: 3.4   Smokeless tobacco: Never   Tobacco comments:    Black and Milds  Vaping Use   Vaping Use: Never used  Substance and Sexual Activity   Alcohol use: Yes    Alcohol/week: 2.0 standard drinks    Types: 2 Cans of beer per week   Drug use: No    Comment: uses CBD   Sexual activity: Not Currently  Other Topics Concern   Not on file  Social History Narrative   Patient is right-handed. She lives in a single level home. Her mother lives with her. She does not exercise.   Caffeine: Kombucha   Social Determinants of Corporate investment banker Strain: Low Risk    Difficulty of Paying Living Expenses: Not hard at all  Food Insecurity: No Food Insecurity   Worried About Programme researcher, broadcasting/film/video in the Last Year: Never true   Barista in the Last Year: Never true  Transportation Needs: No Transportation Needs   Lack of Transportation (Medical): No    Lack of Transportation (Non-Medical): No  Physical Activity: Inactive   Days of Exercise per Week: 0 days   Minutes of Exercise per Session: 0 min  Stress: Not on file  Social Connections: Not on file  Intimate Partner Violence: Not on file    ROS Review of Systems  Constitutional:  Positive for fatigue. Negative for activity change and chills.  HENT:  Negative for congestion and voice change.   Eyes:  Negative for pain and redness.  Respiratory:  Negative for cough and wheezing.   Cardiovascular:  Negative for chest pain.  Gastrointestinal:  Negative for constipation, diarrhea, nausea and vomiting.  Endocrine: Negative for polyuria.  Genitourinary:  Negative for frequency.  Musculoskeletal:  Positive for arthralgias and myalgias.  Skin:  Negative for color change and rash.  Allergic/Immunologic: Negative for immunocompromised state.  Neurological:  Negative for dizziness.  Psychiatric/Behavioral:  Negative for agitation.    Objective:   Today's Vitals: BP 120/80   Pulse 72   Ht 5' (1.524 m)   Wt 181 lb 9.6 oz (82.4 kg)   LMP 06/18/2021   SpO2 99%   BMI 35.47 kg/m   Physical Exam Vitals and nursing note reviewed.  Constitutional:      General: She is not in acute distress.    Appearance: Normal appearance. She is not ill-appearing.  HENT:     Head: Normocephalic and atraumatic.     Right Ear: External ear normal.     Left Ear: External ear normal.     Nose: No congestion.  Eyes:     Extraocular Movements: Extraocular movements intact.     Conjunctiva/sclera: Conjunctivae normal.     Pupils: Pupils are equal, round, and reactive to light.  Cardiovascular:     Rate and Rhythm: Normal rate and regular rhythm.     Pulses: Normal pulses.     Heart sounds: Normal heart sounds.  Pulmonary:     Effort: Pulmonary effort is normal.     Breath sounds: Normal breath sounds. No wheezing.  Abdominal:     General: Bowel sounds are normal.     Palpations: Abdomen is soft.   Musculoskeletal:        General: Normal range of motion.     Cervical back: Normal range of motion and neck supple.     Right lower leg: No edema.     Left lower leg: No edema.  Skin:    General: Skin is warm and dry.     Findings: No bruising.  Neurological:     General: No focal deficit present.     Mental Status: She is alert and oriented to person, place, and time.  Psychiatric:        Mood and Affect: Mood normal.        Behavior: Behavior normal.        Thought Content: Thought content normal.      Assessment & Plan:   Problem List Items Addressed This Visit       Other   Fibromyalgia - Primary   Relevant Orders   Rheumatoid factor   Ambulatory referral to Neurosurgery   Chronic fatigue   Relevant Orders   Rheumatoid factor   Ambulatory referral to Neurosurgery   Multiple joint pain   Relevant Orders   Rheumatoid factor   Ambulatory referral to Neurosurgery   Numbness and tingling   Relevant Orders   Rheumatoid factor   Ambulatory referral to Neurosurgery    Follow-up: Return in about 1 month (around 07/22/2021) for Return for Annual Exam with PCP Mayford Knife.   Jake Shark, PA-C

## 2021-06-22 LAB — RHEUMATOID FACTOR: Rheumatoid fact SerPl-aCnc: <10 IU/mL (ref <14.0)

## 2021-06-23 ENCOUNTER — Other Ambulatory Visit: Payer: Self-pay

## 2021-06-23 ENCOUNTER — Emergency Department (HOSPITAL_BASED_OUTPATIENT_CLINIC_OR_DEPARTMENT_OTHER)
Admission: EM | Admit: 2021-06-23 | Discharge: 2021-06-23 | Disposition: A | Discharge status: Home / Self Care | Payer: Commercial Managed Care - HMO | Attending: Emergency Medicine | Admitting: Emergency Medicine

## 2021-06-23 DIAGNOSIS — R112 Nausea with vomiting, unspecified: Secondary | ICD-10-CM | POA: Diagnosis not present

## 2021-06-23 DIAGNOSIS — M797 Fibromyalgia: Secondary | ICD-10-CM | POA: Insufficient documentation

## 2021-06-23 DIAGNOSIS — Z91040 Latex allergy status: Secondary | ICD-10-CM | POA: Insufficient documentation

## 2021-06-23 DIAGNOSIS — R52 Pain, unspecified: Secondary | ICD-10-CM | POA: Diagnosis present

## 2021-06-23 DIAGNOSIS — J45909 Unspecified asthma, uncomplicated: Secondary | ICD-10-CM | POA: Diagnosis not present

## 2021-06-23 MED ORDER — PROMETHAZINE HCL 25 MG PO TABS
25.0000 mg | ORAL_TABLET | Freq: Once | ORAL | Status: AC
  Administered 2021-06-23: 25 mg via ORAL
  Filled 2021-06-23: qty 1

## 2021-06-23 MED ORDER — HYDROCODONE-ACETAMINOPHEN 5-325 MG PO TABS: 1.0000 | ORAL_TABLET | ORAL | 0 refills | Status: DC | PRN

## 2021-06-23 MED ORDER — HYDROCODONE-ACETAMINOPHEN 5-325 MG PO TABS
1.0000 | ORAL_TABLET | Freq: Once | ORAL | Status: DC
  Filled 2021-06-23: qty 1

## 2021-06-23 NOTE — ED Notes (Signed)
Pain throughout, chronic, Fibromyalgia. Neither med seems to be working today. Took both earlier. No new injury, states it feels the same as when she has a fibromyalgia flare up.

## 2021-06-23 NOTE — ED Notes (Signed)
Discharge paperwork given and understood. 

## 2021-06-23 NOTE — Discharge Instructions (Signed)
Your history and exam today are consistent with likely recurrent diffuse pain related to your chronic medical problems.  We had a long shared decision-making conversation and given the lack of new preceding symptoms agree to hold on new labs or imaging.  As you are in the process of getting set up with your new pain team this week, we gave you a short prescription for pain medicine.  Please follow-up with them to get a ongoing pain management plan.  If any symptoms change or worsen acutely, please return to the nearest emergency department.  Please rest and stay hydrated.

## 2021-06-23 NOTE — ED Provider Notes (Signed)
[facility] EMERGENCY DEPT Provider Note   CSN: [id number] Arrival date & time: 06/23/21  1501     History  Chief Complaint  Patient presents with   Pain    Generalized    Melissa [name] is a 41 y.o. female.  The history is provided by the patient and medical records. No language interpreter was used.  Illness Location:  Diffuse pain Quality:  All over Severity:  Severe Onset quality:  Gradual Duration:  3 days Timing:  Constant Progression:  Worsening Chronicity:  Recurrent Associated symptoms: nausea and vomiting   Associated symptoms: no abdominal pain, no chest pain, no congestion, no cough, no diarrhea, no fatigue, no fever, no headaches, no rash, no rhinorrhea and no shortness of breath       Home Medications Prior to Admission medications   Medication Sig Start Date End Date Taking? Authorizing Provider  AJOVY 225 MG/1.5ML SOAJ INJECT 225 MG(1.5 ML) INTO THE SKIN EVERY 30 DAYS. 04/13/21   Anson Fret, MD  albuterol (PROVENTIL) (2.5 MG/3ML) 0.083% nebulizer solution Take 3 mLs (2.5 mg total) by nebulization every 6 (six) hours as needed for wheezing or shortness of breath. 01/03/19   Ardith Dark, MD  albuterol (VENTOLIN HFA) 108 (90 Base) MCG/ACT inhaler TAKE 2 PUFFS BY MOUTH EVERY 6 HOURS AS NEEDED FOR WHEEZE OR SHORTNESS OF BREATH 05/04/21   Ardith Dark, MD  Ascorbic Acid (VITAMIN C) 1000 MG tablet Take 1,000 mg by mouth daily.    [provider]  budesonide-formoterol (SYMBICORT) 80-4.5 MCG/ACT inhaler Inhale 2 puffs into the lungs 2 (two) times daily. 01/03/19   Ardith Dark, MD  CHLOROPHYLL PO Take by mouth.    [provider]  cholecalciferol (VITAMIN D) 1000 units tablet Take 1,000 Units by mouth daily.    [provider]  COD LIVER OIL PO Take by mouth.    [provider]  cyclobenzaprine (FLEXERIL) 10 MG tablet TAKE 1 TABLET BY MOUTH THREE TIMES A DAY AS NEEDED FOR MUSCLE SPASMS 06/04/21   Ardith Dark, MD  dicyclomine (BENTYL) 20 MG tablet Take 1 tablet (20 mg total) by mouth 4 (four) times daily -  before meals and at bedtime. 05/04/21   Ardith Dark, MD  escitalopram (LEXAPRO) 20 MG tablet TAKE 1 TABLET BY MOUTH EVERY DAY 06/04/21   Ardith Dark, MD  meclizine (ANTIVERT) 25 MG tablet TAKE 1 TABLET (25 MG TOTAL) BY MOUTH 2 (TWO) TIMES DAILY AS NEEDED FOR DIZZINESS. 06/04/21   Ardith Dark, MD  Multiple Vitamin (MULTIVITAMIN) tablet Take 1 tablet by mouth daily.    [provider]  Omega-3 Fatty Acids (FISH OIL) 1000 MG CAPS Take by mouth.    [provider]  pantoprazole (PROTONIX) 40 MG tablet TAKE 1 TABLET BY MOUTH EVERY DAY 08/04/20   Ardith Dark, MD  pregabalin (LYRICA) 75 MG capsule Take 1 capsule (75 mg total) by mouth 2 (two) times daily. 05/04/21   Ardith Dark, MD  promethazine (PHENERGAN) 12.5 MG tablet Take 1 tablet (12.5 mg total) by mouth every 8 (eight) hours as needed for nausea or vomiting. 05/04/21   Ardith Dark, MD  Riboflavin (B-2-400 PO) Take by mouth.    [provider]  zolpidem (AMBIEN) 10 MG tablet TAKE 1 TABLET (10 MG TOTAL) BY MOUTH AT BEDTIME AS NEEDED. FOR SLEEP 06/04/21   Ardith Dark, MD      Allergies    Tomato, Asa [aspirin],  Iodinated contrast media, Latex, Penicillins, and Vitamin e    Review of Systems   Review of Systems  Constitutional:  Negative for chills, diaphoresis, fatigue and fever.  HENT:  Negative for congestion and rhinorrhea.   Eyes:  Negative for visual disturbance.  Respiratory:  Negative for cough, chest tightness and shortness of breath.   Cardiovascular:  Negative for chest pain and palpitations.  Gastrointestinal:  Positive for nausea and vomiting. Negative for abdominal pain, constipation and diarrhea.  Genitourinary:  Negative for dysuria.  Musculoskeletal:  Positive for back pain. Negative for neck pain.  Skin:  Negative for rash and wound.  Neurological:  Negative for weakness,  numbness and headaches.  Psychiatric/Behavioral:  Negative for agitation.    Physical Exam Updated Vital Signs BP (!) 145/95 (BP Location: Right Arm)   Pulse 78   Temp 98.3 F (36.8 C)   Resp 19   Ht 5\' 7"  (1.702 m)   Wt 82.1 kg   LMP 06/18/2021   SpO2 100%   BMI 28.35 kg/m  Physical Exam Vitals and nursing note reviewed.  Constitutional:      General: She is not in acute distress.    Appearance: She is well-developed. She is not ill-appearing, toxic-appearing or diaphoretic.  HENT:     Head: Normocephalic and atraumatic.     Nose: No congestion or rhinorrhea.     Mouth/Throat:     Mouth: Mucous membranes are moist.     Pharynx: No oropharyngeal exudate or posterior oropharyngeal erythema.  Eyes:     Extraocular Movements: Extraocular movements intact.     Conjunctiva/sclera: Conjunctivae normal.     Pupils: Pupils are equal, round, and reactive to light.  Cardiovascular:     Rate and Rhythm: Normal rate and regular rhythm.     Heart sounds: No murmur heard. Pulmonary:     Effort: Pulmonary effort is normal. No respiratory distress.     Breath sounds: Normal breath sounds. No wheezing, rhonchi or rales.  Chest:     Chest wall: No tenderness.  Abdominal:     General: Abdomen is flat.     Palpations: Abdomen is soft.     Tenderness: There is no abdominal tenderness. There is no right CVA tenderness, left CVA tenderness, guarding or rebound.  Musculoskeletal:        General: Tenderness present. No swelling.     Cervical back: Neck supple.  Skin:    General: Skin is warm and dry.     Capillary Refill: Capillary refill takes less than 2 seconds.     Findings: No rash.  Neurological:     General: No focal deficit present.     Mental Status: She is alert.  Psychiatric:        Mood and Affect: Mood normal.    ED Results / Procedures / Treatments   Labs (all labs ordered are listed, but only abnormal results are displayed) Labs Reviewed - No data to  display  EKG None  Radiology No results found.  Procedures Procedures    Medications Ordered in ED Medications  promethazine (PHENERGAN) tablet 25 mg (25 mg Oral Given 06/23/21 2040)  HYDROcodone-acetaminophen (NORCO/VICODIN) 5-325 MG per tablet 1 tablet (1 tablet Oral Given 06/23/21 2040)    ED Course/ Medical Decision Making/ A&P                           Medical Decision Making   Melissa [name] is a 41 y.o.  female with a past medical history significant for IBS, for myalgia, hypercholesterolemia, asthma, migraines, and depression who presents with diffuse pain all over her body.  She reports that she has been suffering with fibromyalgia for several years and is currently between pain specialist.  She says that her PCP spoke to her today and is trying to get her in with a pain specialist this week but has not been able to find one.  She also has not been able to see rheumatology recently.  She says that for the last few days the pain has continued and her home medications have not been working.  She says that the symptoms are consistent with prior and denies any new symptoms.  She specifically denies fevers, chills, congestion, cough, constipation, diarrhea, or urinary symptoms.  Denies pelvic symptoms.  Does report some nausea and vomiting with the pain and she reports having Phenergan at home which she has not taken in the last several hours.  She reports the pain is extremely severe.  She denies any new trauma.  Denies other complaints.  On exam, lungs clear and chest nontender.  Abdomen nontender.  Back is diffusely tender as her extremities.  No focal neurologic deficits.  Patient resting but clearly uncomfortable.  She reports her symptoms are unchanged from baseline we had a shared decision-making conversation and patient agrees to hold on labs or extensive imaging.  She thinks that this is her chronic pain that has worsened but as she has not been able to get in with the pain doctor  until next week she was wanting something to help with the pain now.  We had a long discussion that the emergency department is not the place to get pain medicine for chronic pain however given the amount of discomfort she is in and the plan to see the pain specialist this week, we will give a dose of pain medicine here and a short prescription for pain medicine for the next few days.  Patient knows to follow-up with her PCP and pain specialist for ongoing management and she agrees.  Nursing reports that patient does not have someone to drive her so we will hold on the pain pill now but will get the prescription sent to pharmacy.  Overall I suspect she has no other provoking symptoms with no other infectious symptoms.  We will hold on further work-up and patient will be discharged for outpatient follow-up.  She understands return precautions and was discharged in stable condition.             Final Clinical Impression(s) / ED Diagnoses Final diagnoses:  Pain    Clinical Impression: 1. Pain     Disposition: Discharge  Condition: Good  I have discussed the results, Dx and Tx plan with the pt(& family if present). He/she/they expressed understanding and agree(s) with the plan. Discharge instructions discussed at great length. Strict return precautions discussed and pt &/or family have verbalized understanding of the instructions. No further questions at time of discharge.    New Prescriptions   HYDROCODONE-ACETAMINOPHEN (NORCO/VICODIN) 5-325 MG TABLET    Take 1 tablet by mouth every 4 (four) hours as needed.    Follow Up: Lexine BatonWilliams, Lynne B, PA-C [address] [place] [id number] [phone]     your pain specialist         Sawyer Kahan, Canary Brimhristopher J, MD 06/23/21 2053

## 2021-06-23 NOTE — ED Triage Notes (Signed)
Pt arrived POV, here for generalized chronic pain x 2 days. HO Fibromyalgia on Lyrica and Flexeril. Saw PCP yesterday and was not given any new pain meds, states PCP will look into getting pt a referral to pain mgt. Denies chest pain, no SOB, ambulatory.

## 2021-06-25 NOTE — Telephone Encounter (Signed)
I ordered a new referral to Neurology. Thanks.

## 2021-06-25 NOTE — Telephone Encounter (Signed)
Robby Sermon called in from [facility]. She called in about the referral for Novalie, says she believes she should be referred to Neurology instead. She gave a number for you to call her back at [phone] extension [phone].

## 2021-06-25 NOTE — Addendum Note (Signed)
Addended by: Burnard Hawthorne on: 06/25/2021 01:13 PM   Modules accepted: Orders

## 2021-06-25 NOTE — Telephone Encounter (Signed)
Please advise 

## 2021-07-01 ENCOUNTER — Telehealth (HOSPITAL_COMMUNITY): Payer: Self-pay | Admitting: *Deleted

## 2021-07-01 NOTE — Telephone Encounter (Signed)
Left message on voicemail per DPR in reference to upcoming appointment scheduled on  07/07/21 with detailed instructions given per Myocardial Perfusion Study Information Sheet for the test. LM to arrive 15 minutes early, and that it is imperative to arrive on time for appointment to keep from having the test rescheduled. If you need to cancel or reschedule your appointment, please call the office within 24 hours of your appointment. Failure to do so may result in a cancellation of your appointment, and a $50 no show fee. Phone number given for call back for any questions. Gaylin Bulthuis Jacqueline   

## 2021-07-05 ENCOUNTER — Other Ambulatory Visit: Payer: Self-pay | Admitting: Physician Assistant

## 2021-07-05 ENCOUNTER — Other Ambulatory Visit: Payer: Self-pay | Admitting: Family Medicine

## 2021-07-05 MED ORDER — ZOLPIDEM TARTRATE 10 MG PO TABS: 10.0000 mg | ORAL_TABLET | Freq: Every evening | ORAL | 0 refills | Status: DC | PRN

## 2021-07-05 MED ORDER — CYCLOBENZAPRINE HCL 10 MG PO TABS: 10.0000 mg | ORAL_TABLET | Freq: Three times a day (TID) | ORAL | 0 refills | Status: DC | PRN

## 2021-07-05 NOTE — Progress Notes (Signed)
I have logged into and reviewed this patient's PMPAware data today prior to issuing prescriptions for any controlled substances.  

## 2021-07-06 ENCOUNTER — Other Ambulatory Visit: Payer: Self-pay | Admitting: Physician Assistant

## 2021-07-06 ENCOUNTER — Encounter: Payer: Self-pay | Admitting: Physician Assistant

## 2021-07-06 DIAGNOSIS — R5382 Chronic fatigue, unspecified: Secondary | ICD-10-CM

## 2021-07-06 DIAGNOSIS — M2550 Pain in unspecified joint: Secondary | ICD-10-CM

## 2021-07-06 DIAGNOSIS — M797 Fibromyalgia: Secondary | ICD-10-CM

## 2021-07-07 ENCOUNTER — Ambulatory Visit (HOSPITAL_COMMUNITY): Payer: Commercial Managed Care - HMO | Attending: Cardiovascular Disease

## 2021-07-07 ENCOUNTER — Ambulatory Visit
Admission: RE | Admit: 2021-07-07 | Discharge: 2021-07-07 | Disposition: A | Discharge status: Home / Self Care | Payer: Self-pay | Source: Ambulatory Visit | Attending: Cardiovascular Disease | Admitting: Cardiovascular Disease

## 2021-07-07 DIAGNOSIS — E7800 Pure hypercholesterolemia, unspecified: Secondary | ICD-10-CM | POA: Diagnosis present

## 2021-07-07 DIAGNOSIS — R079 Chest pain, unspecified: Secondary | ICD-10-CM

## 2021-07-07 LAB — MYOCARDIAL PERFUSION IMAGING
Angina Index: 0
Duke Treadmill Score: 6
Estimated workload: 7
Exercise duration (min): 6 min
LV dias vol: 61 mL (ref 46–106)
LV sys vol: 30 mL
MPHR: 178 {beats}/min
Nuc Stress EF: 54 %
Peak HR: 160 {beats}/min
Percent HR: 89 %
Rest HR: 80 {beats}/min
Rest Nuclear Isotope Dose: 10.8 mCi
SDS: 0
SRS: 0
SSS: 0
ST Depression (mm): 0 mm
Stress Nuclear Isotope Dose: 32.5 mCi
TID: 0.99

## 2021-07-07 MED ORDER — TECHNETIUM TC 99M TETROFOSMIN IV KIT
32.5000 | PACK | Freq: Once | INTRAVENOUS | Status: AC | PRN
  Administered 2021-07-07: 32.5 via INTRAVENOUS

## 2021-07-07 MED ORDER — TECHNETIUM TC 99M TETROFOSMIN IV KIT
10.8000 | PACK | Freq: Once | INTRAVENOUS | Status: AC | PRN
  Administered 2021-07-07: 10.8 via INTRAVENOUS

## 2021-07-09 ENCOUNTER — Other Ambulatory Visit: Payer: Self-pay | Admitting: Family Medicine

## 2021-07-12 ENCOUNTER — Telehealth (HOSPITAL_BASED_OUTPATIENT_CLINIC_OR_DEPARTMENT_OTHER): Payer: Self-pay

## 2021-07-12 DIAGNOSIS — E7800 Pure hypercholesterolemia, unspecified: Secondary | ICD-10-CM

## 2021-07-12 NOTE — Telephone Encounter (Addendum)
Patient notes that she has lost 12 pounds, she states she has been changing her diet and exercise and would really like to have her labs rechecked before starting another medication. Patient would like to know what the earliest she could have her labs rechecked would be.   Routing to Dr. Duke Salvia to advise.     ----- Message from Chilton Si, MD sent at 07/12/2021  1:31 PM EDT ----- Mild plaque is seen.  However this is more than we would expect for age.  Although this plaque is not causing her chest pain we need to be aggressive about prevention.  Recommend starting rosuvastatin 20 mg daily.  Check lipids and a CMP in 2 to 3 months.

## 2021-07-12 NOTE — Telephone Encounter (Signed)
Per Dr. Duke Salvia- "That is wonderful!  Great job.  Happy for her to come check fasting lipids/CMP."   Labs ordered, mailed to patient and send patient update via mychart.

## 2021-07-12 NOTE — Addendum Note (Signed)
Addended by: Marlene Lard on: 07/12/2021 04:19 PM   Modules accepted: Orders

## 2021-07-14 ENCOUNTER — Other Ambulatory Visit: Payer: Self-pay | Admitting: Physician Assistant

## 2021-07-14 DIAGNOSIS — Z1231 Encounter for screening mammogram for malignant neoplasm of breast: Secondary | ICD-10-CM

## 2021-07-20 ENCOUNTER — Other Ambulatory Visit: Payer: Self-pay | Admitting: Physician Assistant

## 2021-07-20 DIAGNOSIS — E782 Mixed hyperlipidemia: Secondary | ICD-10-CM

## 2021-07-20 MED ORDER — ROSUVASTATIN CALCIUM 20 MG PO TABS: 20.0000 mg | ORAL_TABLET | Freq: Every day | ORAL | 1 refills | Status: DC

## 2021-07-20 NOTE — Progress Notes (Signed)
Please call patient to tell her that Dr. Duke Salvia, Cardiology, recommends starting rosuvastatin 20 mg daily.  Check lipids and a CMP in 2 to 3 months. I will order the statin and future labs. She can come in fasting for a lab only appointment. Thank you.

## 2021-07-22 ENCOUNTER — Other Ambulatory Visit: Payer: Self-pay | Admitting: Family Medicine

## 2021-07-22 ENCOUNTER — Other Ambulatory Visit: Payer: Self-pay | Admitting: Physician Assistant

## 2021-07-23 ENCOUNTER — Other Ambulatory Visit: Payer: Self-pay | Admitting: Physician Assistant

## 2021-07-23 MED ORDER — CYCLOBENZAPRINE HCL 10 MG PO TABS
10.0000 mg | ORAL_TABLET | Freq: Three times a day (TID) | ORAL | 0 refills | Status: DC | PRN
Start: 2021-07-23 — End: 2021-11-10

## 2021-07-28 LAB — COMPREHENSIVE METABOLIC PANEL
ALT: 21 IU/L (ref 0–32)
AST: 20 IU/L (ref 0–40)
Albumin/Globulin Ratio: 1.6 (ref 1.2–2.2)
Albumin: 4.6 g/dL (ref 3.8–4.8)
Alkaline Phosphatase: 49 IU/L (ref 44–121)
BUN/Creatinine Ratio: 9 (ref 9–23)
BUN: 7 mg/dL (ref 6–24)
Bilirubin Total: 0.3 mg/dL (ref 0.0–1.2)
CO2: 20 mmol/L (ref 20–29)
Calcium: 9.5 mg/dL (ref 8.7–10.2)
Chloride: 102 mmol/L (ref 96–106)
Creatinine, Ser: 0.79 mg/dL (ref 0.57–1.00)
Globulin, Total: 2.9 g/dL (ref 1.5–4.5)
Glucose: 112 mg/dL — ABNORMAL HIGH (ref 70–99)
Potassium: 4.1 mmol/L (ref 3.5–5.2)
Sodium: 139 mmol/L (ref 134–144)
Total Protein: 7.5 g/dL (ref 6.0–8.5)
eGFR: 96 mL/min/{1.73_m2} (ref >59)

## 2021-07-28 LAB — LIPID PANEL
Chol/HDL Ratio: 3 ratio (ref 0.0–4.4)
Cholesterol, Total: 136 mg/dL (ref 100–199)
HDL: 45 mg/dL (ref >39)
LDL Chol Calc (NIH): 72 mg/dL (ref 0–99)
Triglycerides: 102 mg/dL (ref 0–149)
VLDL Cholesterol Cal: 19 mg/dL (ref 5–40)

## 2021-07-29 ENCOUNTER — Other Ambulatory Visit: Payer: Self-pay | Admitting: Family Medicine

## 2021-07-30 ENCOUNTER — Other Ambulatory Visit: Payer: Self-pay | Admitting: Physician Assistant

## 2021-07-30 MED ORDER — PROMETHAZINE HCL 12.5 MG PO TABS: 12.5000 mg | ORAL_TABLET | Freq: Three times a day (TID) | ORAL | 0 refills | Status: AC | PRN

## 2021-08-06 ENCOUNTER — Encounter (HOSPITAL_BASED_OUTPATIENT_CLINIC_OR_DEPARTMENT_OTHER): Payer: Self-pay

## 2021-08-06 ENCOUNTER — Ambulatory Visit (INDEPENDENT_AMBULATORY_CARE_PROVIDER_SITE_OTHER): Payer: Commercial Managed Care - HMO | Admitting: Family

## 2021-08-06 ENCOUNTER — Other Ambulatory Visit: Payer: Self-pay | Admitting: Physician Assistant

## 2021-08-06 ENCOUNTER — Encounter (HOSPITAL_BASED_OUTPATIENT_CLINIC_OR_DEPARTMENT_OTHER): Payer: Self-pay | Admitting: Family

## 2021-08-06 VITALS — BP 124/82 | HR 92 | Ht 69.0 in | Wt 177.0 lb

## 2021-08-06 DIAGNOSIS — E7800 Pure hypercholesterolemia, unspecified: Secondary | ICD-10-CM

## 2021-08-06 MED ORDER — ZOLPIDEM TARTRATE 10 MG PO TABS
10.0000 mg | ORAL_TABLET | Freq: Every evening | ORAL | 0 refills | Status: DC | PRN
Start: 2021-08-06 — End: 2021-09-07

## 2021-08-06 NOTE — Progress Notes (Signed)
I have logged into and reviewed this patient's PMPAware data today prior to issuing prescriptions for any controlled substances.  

## 2021-08-06 NOTE — Telephone Encounter (Signed)
Pt is requesting refill on Ambien sent to [facility] on [address]

## 2021-08-08 ENCOUNTER — Other Ambulatory Visit: Payer: Self-pay | Admitting: Family Medicine

## 2021-08-09 ENCOUNTER — Encounter: Payer: Commercial Managed Care - HMO | Admitting: Physician Assistant

## 2021-08-09 NOTE — Progress Notes (Signed)
Patient canceled appointment.  Has rescheduled.  No charge encounter.  Alver Sorrow, NP

## 2021-08-18 ENCOUNTER — Other Ambulatory Visit: Payer: Self-pay | Admitting: Family Medicine

## 2021-08-23 ENCOUNTER — Other Ambulatory Visit: Payer: Self-pay | Admitting: Physician Assistant

## 2021-08-23 ENCOUNTER — Ambulatory Visit
Admission: RE | Admit: 2021-08-23 | Discharge: 2021-08-23 | Disposition: A | Discharge status: Home / Self Care | Payer: Commercial Managed Care - HMO | Source: Ambulatory Visit | Attending: Physician Assistant | Admitting: Physician Assistant

## 2021-08-23 DIAGNOSIS — Z1231 Encounter for screening mammogram for malignant neoplasm of breast: Secondary | ICD-10-CM

## 2021-09-07 ENCOUNTER — Other Ambulatory Visit: Payer: Self-pay | Admitting: Physician Assistant

## 2021-09-07 MED ORDER — ZOLPIDEM TARTRATE 10 MG PO TABS: 10.0000 mg | ORAL_TABLET | Freq: Every evening | ORAL | 0 refills | Status: AC | PRN

## 2021-09-15 ENCOUNTER — Other Ambulatory Visit: Payer: Self-pay | Admitting: Family Medicine

## 2021-09-18 IMAGING — MG DIGITAL DIAGNOSTIC BILAT W/ TOMO W/ CAD
6 of 10 series · 6 of 30 positions shown · non-contrast
Comparison: Previous exam(s).

CLINICAL DATA: 39-year-old female with focal right breast/axillary
pain for 3-4 months.

EXAM:
DIGITAL DIAGNOSTIC BILATERAL MAMMOGRAM WITH TOMOSYNTHESIS AND CAD;
ULTRASOUND RIGHT BREAST LIMITED
TECHNIQUE: Bilateral digital diagnostic mammography and breast tomosynthesis
was performed. The images were evaluated with computer-aided
detection.; Targeted ultrasound examination of the right breast was
performed

[L CC synth-2D]
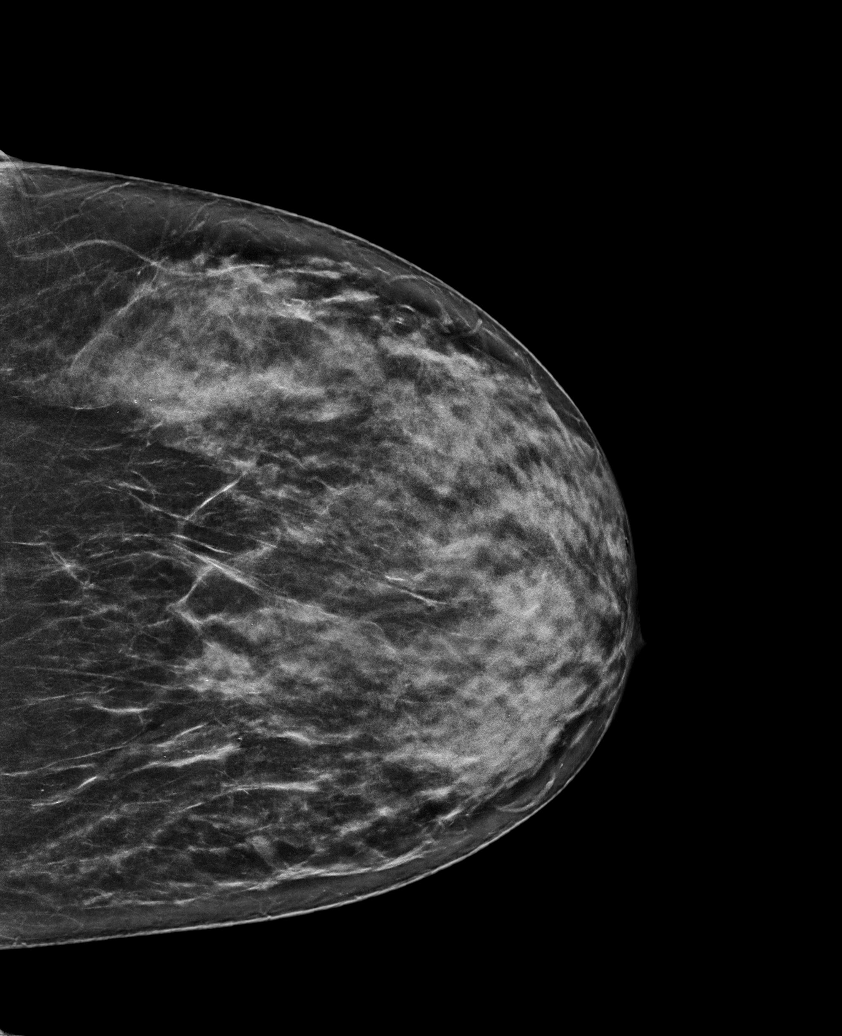

[L MLO synth-2D]
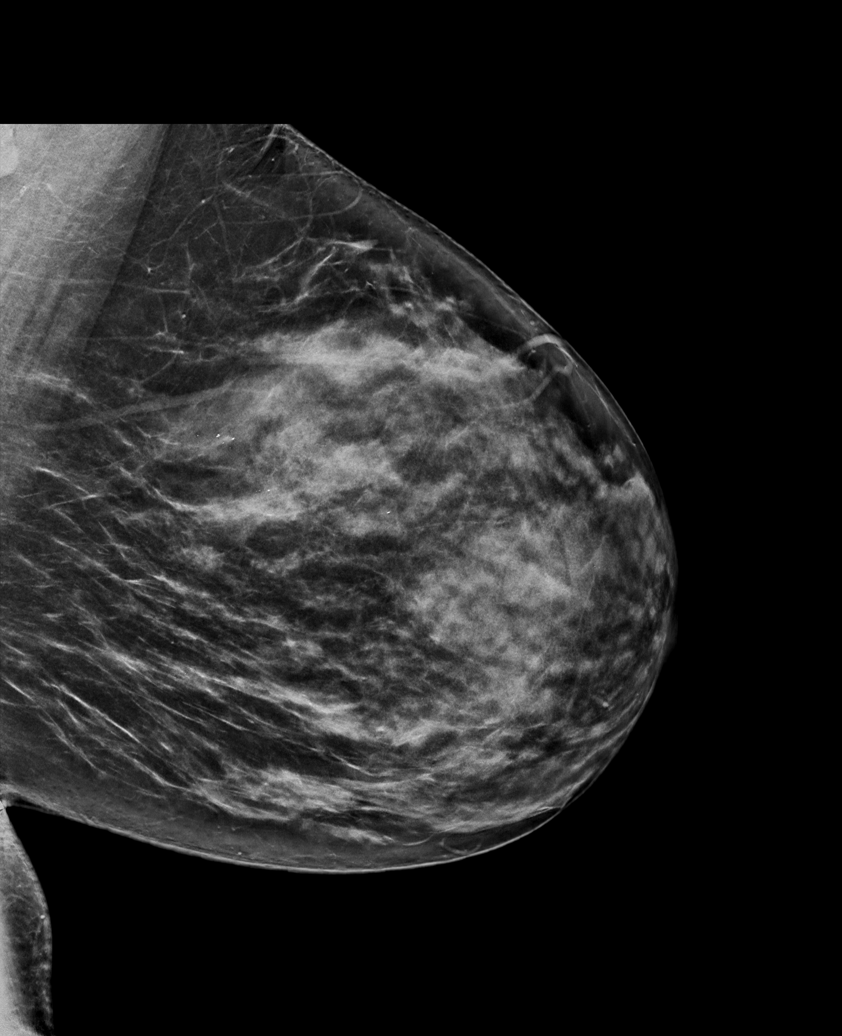

[R MLO synth-2D (1 of 2)]
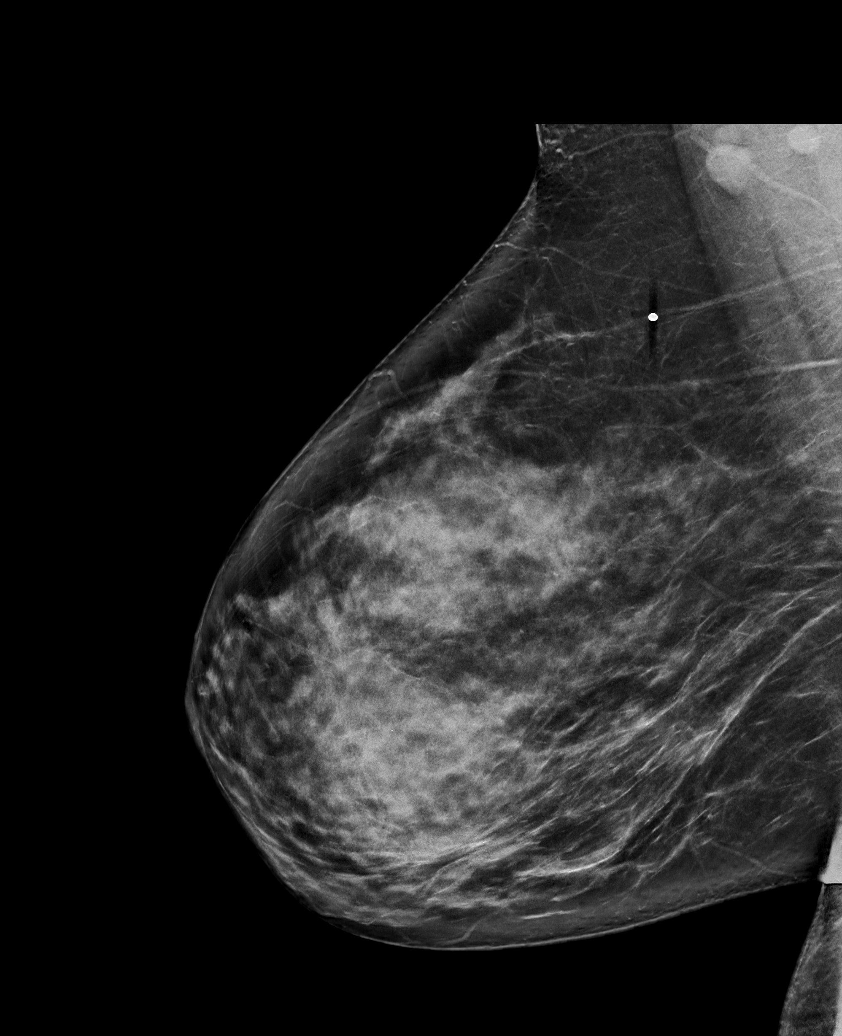

[R CC synth-2D]
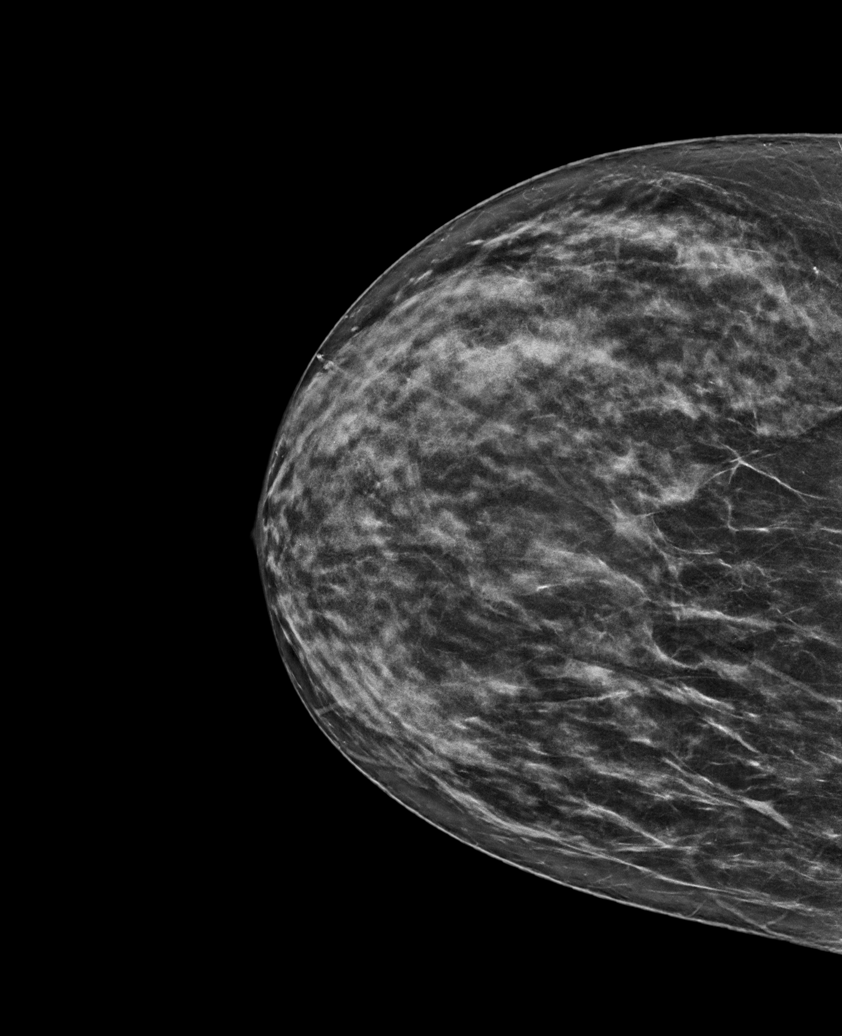

[R MLO synth-2D (2 of 2)]
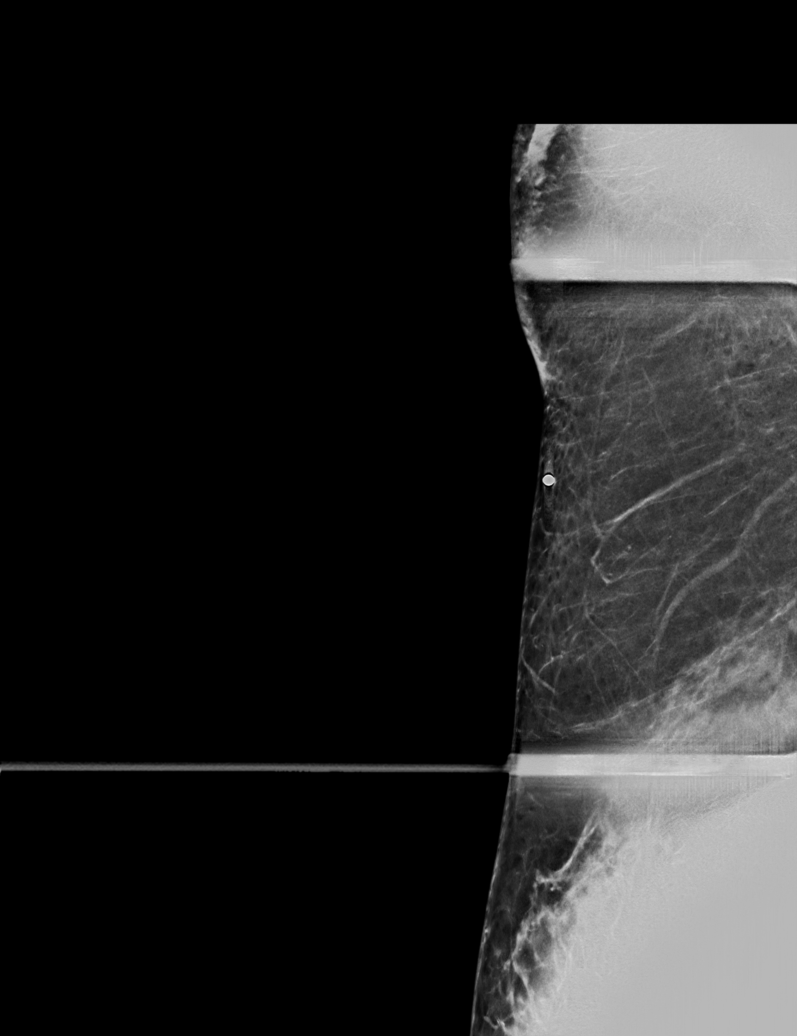

[R MLO tomo · tomo slice 37/72.0]
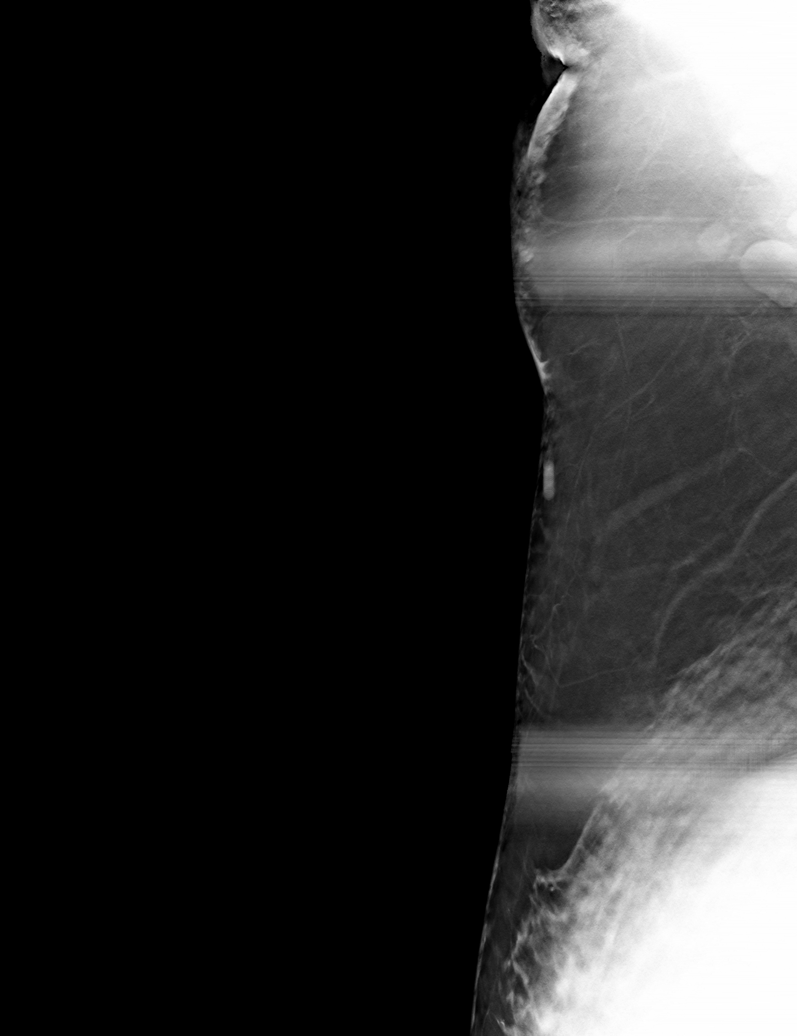

[6 of 30 positions shown; findings below may reference images not displayed]

ACR Breast Density Category c: The breast tissue is heterogeneously
dense, which may obscure small masses.
FINDINGS: A radiopaque BB was placed at the site of the patient's focal
symptoms in the far upper outer right breast. No focal or suspicious
findings are seen at the site of the radiopaque BB. No suspicious
findings are noted in the remainder of either breast. The
parenchymal pattern is stable.

Targeted ultrasound is performed, showing normal fibroglandular
tissue without focal or suspicious sonographic abnormality.
Evaluation of the upper outer right breast and axilla was performed.
IMPRESSION: 1. No mammographic evidence of malignancy in either breast.
2. Unremarkable sonographic evaluation of the site of the patient's
right breast symptoms.

RECOMMENDATION:
1. Clinical follow-up recommended for the painful area of concern in
the right breast. Any further workup should be based on clinical
grounds.
2.  Screening mammogram in one year.(Code:[id number])

I have discussed the findings and recommendations with the patient.
If applicable, a reminder letter will be sent to the patient
regarding the next appointment.

BI-RADS CATEGORY  1: Negative.

## 2021-09-30 ENCOUNTER — Ambulatory Visit (HOSPITAL_BASED_OUTPATIENT_CLINIC_OR_DEPARTMENT_OTHER): Payer: Commercial Managed Care - HMO | Admitting: Cardiovascular Disease

## 2021-10-01 ENCOUNTER — Other Ambulatory Visit: Payer: Self-pay | Admitting: Physician Assistant

## 2021-10-01 ENCOUNTER — Other Ambulatory Visit: Payer: Self-pay | Admitting: Family Medicine

## 2021-10-06 ENCOUNTER — Encounter: Payer: Self-pay | Admitting: Internal Medicine

## 2021-10-07 ENCOUNTER — Encounter: Payer: Self-pay | Admitting: Adult Health

## 2021-10-07 ENCOUNTER — Ambulatory Visit: Payer: Commercial Managed Care - HMO | Admitting: Adult Health

## 2021-10-07 VITALS — BP 137/95 | HR 92 | Ht 67.0 in | Wt 176.2 lb

## 2021-10-07 DIAGNOSIS — G4733 Obstructive sleep apnea (adult) (pediatric): Secondary | ICD-10-CM | POA: Diagnosis not present

## 2021-10-07 DIAGNOSIS — Z9989 Dependence on other enabling machines and devices: Secondary | ICD-10-CM | POA: Diagnosis not present

## 2021-10-07 DIAGNOSIS — G43709 Chronic migraine without aura, not intractable, without status migrainosus: Secondary | ICD-10-CM

## 2021-10-07 MED ORDER — SUMATRIPTAN SUCCINATE 50 MG PO TABS: 50.0000 mg | ORAL_TABLET | Freq: Every day | ORAL | 0 refills | Status: DC | PRN

## 2021-10-07 NOTE — Patient Instructions (Signed)
Your Plan:  Continue CPAP Continue Ajovy Use imitrex for abortive therapy If your symptoms worsen or you develop new symptoms please let us know.    Thank you for coming to see [place] at [facility]. I hope we have been able to provide you high quality care today.  You may receive a patient satisfaction survey over the next few weeks. We would appreciate your feedback and comments so that we may continue to improve ourselves and the health of our patients.

## 2021-10-07 NOTE — Progress Notes (Signed)
PATIENT: Melissa [name] DOB: 11/11/1980  REASON FOR VISIT: follow up HISTORY FROM: patient PRIMARY NEUROLOGIST: Dr. Vickey Huger  Chief Complaint  Patient presents with   Follow-up    Rm 7, alone.  CPAP      HISTORY OF PRESENT ILLNESS:  Today 10/07/21: [name] is a 41 year old female with a history of obstructive sleep apnea on CPAP.  She returns today for follow-up.  Her download indicates that she is she used her machine 28 out of 30 days for compliance of 93%.  She used her machine greater than 4 hours 26 out of 30 days for compliance of 87%.  On average she uses her machine 6.7 hours each night.  Her residual AHI is 2.4 on 5 to 15 cm of water.  She remains on Ajovy for migraines. Reports that it works well. Unable to get it from her pharmacy due to demand. Should get next dose on Friday. Doesn't take anything when she gets a headache. Reports that she tried imitrex in the past.  Reports that the reason why she stopped it was because she was overusing it due to not being on a preventative medication.  At the end of the visit today she mentions that she is having muscle spasms on the right side of the body.  She states that her primary care has done a full work-up and made referrals however she was also told to mention it at our office visit.  10/07/20: [name] is a 41 year old female with a history of obstructive sleep apnea on CPAP.  Her download indicates that she used her machine 28 out of 30 days for compliance of 93%.  She use her machine greater than 4 hours 28 days for compliance of 93%.  On average she uses her machine 9 hours and 9 minutes.  Her residual AHI is 1.4 on 5 to 15 cm of water.  She reports that the CPAP is working well for her.  She reports that she does suffer with fibromyalgia and states that her sleepiness score is elevated due to that.  HISTORY Melissa [name] is a 41 y.o. year old Black or [place] American female patient seen here upon  referral by Dr Lucia Gaskins  on 05/07/2020 Chief concern according to patient :   Pt alone, rm 10. Presents today to assess OSA a concern. She has never had a SS. States that her ex BF and mom have witnessed apnea events. Never been told she snores.  She avg 10/11 hrs of sleep and states that is sounds sleep. When she wakes up she is still tired.     [name]  has a past medical history of Asthma,IBS (irritable bowel syndrome), and Migraine.(migraines began in her thirties, getting more frequent).     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   and lives in a household with BF. 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 Tobacco use*= quit 2.5 years ago   ETOH use - " daily glass of wine ",  Caffeine intake none . Regular exercise - none .     Sleep habits are as follows: The patient's dinner time is between 8 PM. The patient goes to bed at 9 PM and continues to sleep for 10 hours, wakes for 1-2 bathroom breaks.   The preferred sleep position is sideways , with the support of 2 pillows.  Dreams are reportedly  infrequent.  7.15   AM is the usual rise time. The patient wakes up with an alarm.  She reports not feeling refreshed or restored in AM, with symptoms such as some morning headaches.  Naps are taken frequently, lasting from 5 hours  minutes and are no more refreshing than nocturnal sleep.     REVIEW OF SYSTEMS: Out of a complete 14 system review of symptoms, the patient complains only of the following symptoms, and all other reviewed systems are negative.   ESS 13  ALLERGIES: Allergies  Allergen Reactions   Tomato Anaphylaxis   Asa [Aspirin]    Iodinated Contrast Media Hives   Latex Itching   Penicillins    Vitamin E Hives    HOME MEDICATIONS: Outpatient Medications Prior to Visit  Medication Sig Dispense Refill   zolpidem (AMBIEN) 10 MG tablet Take 1 tablet (10 mg total) by mouth at  bedtime as needed for sleep. 30 tablet 0   AJOVY 225 MG/1.5ML SOAJ INJECT 225 MG(1.5 ML) INTO THE SKIN EVERY 30 DAYS. 1.5 mL 5   albuterol (PROVENTIL) (2.5 MG/3ML) 0.083% nebulizer solution Take 3 mLs (2.5 mg total) by nebulization every 6 (six) hours as needed for wheezing or shortness of breath. 150 mL 1   albuterol (VENTOLIN HFA) 108 (90 Base) MCG/ACT inhaler TAKE 2 PUFFS BY MOUTH EVERY 6 HOURS AS NEEDED FOR WHEEZE OR SHORTNESS OF BREATH 8.5 each 3   Ascorbic Acid (VITAMIN C) 1000 MG tablet Take 1,000 mg by mouth daily.     budesonide-formoterol (SYMBICORT) 80-4.5 MCG/ACT inhaler Inhale 2 puffs into the lungs 2 (two) times daily. 1 Inhaler 3   CHLOROPHYLL PO Take by mouth.     cholecalciferol (VITAMIN D) 1000 units tablet Take 1,000 Units by mouth daily.     COD LIVER OIL PO Take by mouth.     cyclobenzaprine (FLEXERIL) 10 MG tablet Take 1 tablet (10 mg total) by mouth 3 (three) times daily as needed for muscle spasms. 90 tablet 0   dicyclomine (BENTYL) 20 MG tablet TAKE 1 TABLET BY MOUTH 4 TIMES DAILY - BEFORE MEALS AND AT BEDTIME. 120 tablet 0   escitalopram (LEXAPRO) 20 MG tablet TAKE 1 TABLET BY MOUTH EVERY DAY 90 tablet 3   HYDROcodone-acetaminophen (NORCO/VICODIN) 5-325 MG tablet Take 1 tablet by mouth every 4 (four) hours as needed. 10 tablet 0   meclizine (ANTIVERT) 25 MG tablet TAKE 1 TABLET (25 MG TOTAL) BY MOUTH 2 (TWO) TIMES DAILY AS NEEDED FOR DIZZINESS. 30 tablet 5   Multiple Vitamin (MULTIVITAMIN) tablet Take 1 tablet by mouth daily.     Omega-3 Fatty Acids (FISH OIL) 1000 MG CAPS Take by mouth.     pantoprazole (PROTONIX) 40 MG tablet TAKE 1 TABLET BY MOUTH EVERY DAY 90 tablet 3   pregabalin (LYRICA) 75 MG capsule Take 1 capsule (75 mg total) by mouth 2 (two) times daily. 180 capsule 3   promethazine (PHENERGAN) 12.5 MG tablet Take 1 tablet (12.5 mg total) by mouth every 8 (eight) hours as needed for nausea or vomiting. 30 tablet 0   Riboflavin (B-2-400 PO) Take by mouth.      rosuvastatin (CRESTOR) 20 MG tablet Take 1 tablet (20 mg total) by mouth daily. 90 tablet 1   Facility-Administered Medications Prior to Visit  Medication Dose Route Frequency Provider Last Rate Last Admin   Fremanezumab-vfrm SOSY 25 mg  25 mg Subcutaneous Once Anson Fret, MD        PAST MEDICAL HISTORY: Past  Medical History:  Diagnosis Date   Asthma    Chest pain of uncertain etiology 06/08/2021   Fibromyalgia    IBS (irritable bowel syndrome)    Migraine    Pure hypercholesterolemia 06/08/2021    PAST SURGICAL HISTORY: Past Surgical History:  Procedure Laterality Date   [facility]  2008    FAMILY HISTORY: Family History  Problem Relation Age of Onset   Heart disease Mother    Arthritis Mother    Diabetes Mother    High Cholesterol Mother    Migraines Mother    Cancer Father    High blood pressure Father    High Cholesterol Sister    High blood pressure Sister    Kidney disease Maternal Grandmother    Heart disease Maternal Grandfather    Cancer Maternal Grandfather    High Cholesterol Paternal Grandmother    High blood pressure Paternal Grandmother    High blood pressure Paternal Grandfather     SOCIAL HISTORY: Social History   Socioeconomic History   Marital status: Single    Spouse name: Not on file   Number of children: 0   Years of education: Not on file   Highest education level: Bachelor's degree (e.g., BA, AB, BS)  Occupational History   Occupation: Marine scientist: [employer]  Tobacco Use   Smoking status: Former    Types: Cigars    Quit date: 01/01/2018    Years since quitting: 3.7   Smokeless tobacco: Never   Tobacco comments:    Black and Milds  Vaping Use   Vaping Use: Never used  Substance and Sexual Activity   Alcohol use: Yes    Alcohol/week: 2.0 standard drinks of alcohol    Types: 2 Cans of beer per week   Drug use: No    Comment: uses CBD   Sexual activity: Not Currently  Other Topics Concern   Not on file  Social History  Narrative   Patient is right-handed. She lives in a single level home. Her mother lives with her. She does not exercise.   Caffeine: Kombucha   Social Determinants of Health   Financial Resource Strain: Low Risk  (06/08/2021)   Overall Financial Resource Strain (CARDIA)    Difficulty of Paying Living Expenses: Not hard at all  Food Insecurity: No Food Insecurity (06/08/2021)   Hunger Vital Sign    Worried About Running Out of Food in the Last Year: Never true    Ran Out of Food in the Last Year: Never true  Transportation Needs: No Transportation Needs (06/08/2021)   PRAPARE - Administrator, Civil Service (Medical): No    Lack of Transportation (Non-Medical): No  Physical Activity: Inactive (06/08/2021)   Exercise Vital Sign    Days of Exercise per Week: 0 days    Minutes of Exercise per Session: 0 min  Stress: Not on file  Social Connections: Not on file  Intimate Partner Violence: Not on file      PHYSICAL EXAM  Vitals:   10/07/21 1010  BP: (!) 137/95  Pulse: 92  Weight: 176 lb 3.2 oz (79.9 kg)  Height: 5\' 7"  (1.702 m)    Body mass index is 27.6 kg/m.  Generalized: Well developed, in no acute distress  Chest: Lungs clear to auscultation bilaterally  Neurological examination  Mentation: Alert oriented to time, place, history taking. Follows all commands speech and language fluent Cranial nerve II-XII: Extraocular movements were full, visual field were full on confrontational  test Head turning and shoulder shrug  were normal and symmetric. Motor: The motor testing reveals 5 over 5 strength of all 4 extremities.  Giveaway weakness noted on the left side good symmetric motor tone is noted throughout.  Sensory: Sensory testing is intact to soft touch on all 4 extremities. No evidence of extinction is noted.  Gait and station: Patient is able to stand without assistance.  Gait is normal.    DIAGNOSTIC DATA (LABS, IMAGING, TESTING) - I reviewed patient records,  labs, notes, testing and imaging myself where available.  Lab Results  Component Value Date   WBC 4.6 05/04/2021   HGB 12.3 05/04/2021   HCT 37.5 05/04/2021   MCV 86.2 05/04/2021   PLT 229.0 05/04/2021      Component Value Date/Time   NA 139 07/28/2021 1039   K 4.1 07/28/2021 1039   CL 102 07/28/2021 1039   CO2 20 07/28/2021 1039   GLUCOSE 112 (H) 07/28/2021 1039   GLUCOSE 108 (H) 05/04/2021 1219   BUN 7 07/28/2021 1039   CREATININE 0.79 07/28/2021 1039   CREATININE 0.69 11/09/2018 1534   CALCIUM 9.5 07/28/2021 1039   PROT 7.5 07/28/2021 1039   ALBUMIN 4.6 07/28/2021 1039   AST 20 07/28/2021 1039   ALT 21 07/28/2021 1039   ALKPHOS 49 07/28/2021 1039   BILITOT 0.3 07/28/2021 1039   GFRNONAA >60 08/15/2016 1618   GFRAA <=90 11/18/2019 0000   Lab Results  Component Value Date   CHOL 136 07/28/2021   HDL 45 07/28/2021   LDLCALC 72 07/28/2021   LDLDIRECT 164.0 07/09/2020   TRIG 102 07/28/2021   CHOLHDL 3.0 07/28/2021   Lab Results  Component Value Date   HGBA1C 6.1 05/04/2021   Lab Results  Component Value Date   VITAMINB12 1,214 (H) 05/04/2021   Lab Results  Component Value Date   TSH 1.44 05/04/2021      ASSESSMENT AND PLAN 41 y.o. year old female  has a past medical history of Asthma, Chest pain of uncertain etiology (06/08/2021), Fibromyalgia, IBS (irritable bowel syndrome), Migraine, and Pure hypercholesterolemia (06/08/2021). here with:  OSA on CPAP  CPAP compliance excellent Good treatment of AHI  Encourage patient to use CPAP nightly and > 4 hours each night  2.  Migraine headaches  Continue Ajovy Start Imitrex for abortive therapy  Patient briefly mentions at the end of the visit about having muscle spasm on the right side of the body.  She reports that her primary care is done a full work-up but advised that she mention it at this appointment.  Advised patient that I would need to read over the notes from her PCP and what work-up has been  completed.  If I feel that we need to do any additional work-up I will let her know.  Follow-up in 1 year or sooner if needed  Butch Penny, MSN, NP-C 10/07/2021, 9:59 AM [facility] [address] [place], [place] [address] [phone]

## 2021-10-07 NOTE — Progress Notes (Signed)
Reviewed her note from her primary care it seems that the patient has a history of fibromyalgia.  She has been referred to rheumatology by her PCP.  I do not feel that any additional work-up through our office is necessary at this time.  PCP can certainly send a new referral if they feel it is needed

## 2021-10-11 ENCOUNTER — Other Ambulatory Visit: Payer: Self-pay | Admitting: Family Medicine

## 2021-10-11 ENCOUNTER — Emergency Department (HOSPITAL_COMMUNITY)
Admission: EM | Admit: 2021-10-11 | Discharge: 2021-10-12 | Disposition: A | Discharge status: Home / Self Care | Payer: Commercial Managed Care - HMO | Attending: Emergency Medicine | Admitting: Emergency Medicine

## 2021-10-11 DIAGNOSIS — Z87891 Personal history of nicotine dependence: Secondary | ICD-10-CM | POA: Diagnosis not present

## 2021-10-11 DIAGNOSIS — Z20822 Contact with and (suspected) exposure to covid-19: Secondary | ICD-10-CM | POA: Diagnosis not present

## 2021-10-11 DIAGNOSIS — J45909 Unspecified asthma, uncomplicated: Secondary | ICD-10-CM | POA: Insufficient documentation

## 2021-10-11 DIAGNOSIS — R1032 Left lower quadrant pain: Secondary | ICD-10-CM | POA: Diagnosis present

## 2021-10-11 LAB — RESP PANEL BY RT-PCR (FLU A&B, COVID) ARPGX2
Influenza A by PCR: NEGATIVE
Influenza B by PCR: NEGATIVE
SARS Coronavirus 2 by RT PCR: NEGATIVE

## 2021-10-11 LAB — URINALYSIS, ROUTINE W REFLEX MICROSCOPIC
Bilirubin Urine: NEGATIVE
Glucose, UA: NEGATIVE mg/dL
Hgb urine dipstick: NEGATIVE
Ketones, ur: NEGATIVE mg/dL
Leukocytes,Ua: NEGATIVE
Nitrite: NEGATIVE
Protein, ur: NEGATIVE mg/dL
Specific Gravity, Urine: 1.002 — ABNORMAL LOW (ref 1.005–1.030)
pH: 6 (ref 5.0–8.0)

## 2021-10-11 LAB — CBC WITH DIFFERENTIAL/PLATELET
Abs Immature Granulocytes: 0.01 10*3/uL (ref 0.00–0.07)
Basophils Absolute: 0.1 10*3/uL (ref 0.0–0.1)
Basophils Relative: 1 %
Eosinophils Absolute: 0.1 10*3/uL (ref 0.0–0.5)
Eosinophils Relative: 1 %
HCT: 39.1 % (ref 36.0–46.0)
Hemoglobin: 13.7 g/dL (ref 12.0–15.0)
Immature Granulocytes: 0 %
Lymphocytes Relative: 54 %
Lymphs Abs: 3.5 10*3/uL (ref 0.7–4.0)
MCH: 29.6 pg (ref 26.0–34.0)
MCHC: 35 g/dL (ref 30.0–36.0)
MCV: 84.4 fL (ref 80.0–100.0)
Monocytes Absolute: 0.4 10*3/uL (ref 0.1–1.0)
Monocytes Relative: 7 %
Neutro Abs: 2.3 10*3/uL (ref 1.7–7.7)
Neutrophils Relative %: 37 %
Platelets: 264 10*3/uL (ref 150–400)
RBC: 4.63 MIL/uL (ref 3.87–5.11)
RDW: 12.5 % (ref 11.5–15.5)
WBC: 6.4 10*3/uL (ref 4.0–10.5)
nRBC: 0 % (ref 0.0–0.2)

## 2021-10-11 LAB — COMPREHENSIVE METABOLIC PANEL
ALT: 27 U/L (ref 0–44)
AST: 23 U/L (ref 15–41)
Albumin: 4.3 g/dL (ref 3.5–5.0)
Alkaline Phosphatase: 46 U/L (ref 38–126)
Anion gap: 10 (ref 5–15)
BUN: 8 mg/dL (ref 6–20)
CO2: 22 mmol/L (ref 22–32)
Calcium: 9.7 mg/dL (ref 8.9–10.3)
Chloride: 105 mmol/L (ref 98–111)
Creatinine, Ser: 0.73 mg/dL (ref 0.44–1.00)
GFR, Estimated: >60 mL/min (ref >60)
Glucose, Bld: 95 mg/dL (ref 70–99)
Potassium: 4.4 mmol/L (ref 3.5–5.1)
Sodium: 137 mmol/L (ref 135–145)
Total Bilirubin: 0.5 mg/dL (ref 0.3–1.2)
Total Protein: 8.2 g/dL — ABNORMAL HIGH (ref 6.5–8.1)

## 2021-10-11 NOTE — ED Triage Notes (Signed)
Patient reports fibromyalgia attack with generalized body aches / muscle pain and low back pain onset last Thursday , no fever or chills , respirations unlabored.

## 2021-10-11 NOTE — ED Provider Triage Note (Signed)
Emergency Medicine Provider Triage Evaluation Note  Melissa [name] , a 41 y.o. female with history of fibromyalgia, asthma was evaluated in triage.  Pt complains of generalized body aches, left-sided abdominal pain, low back pain and some shortness of breath that started on Thursday, 4 days ago.  Patient denies cough, congestion.  She did have 1 fever when symptoms first developed.  She denies chest pain, nausea, vomiting and diarrhea.  Also denies dysuria and hematuria.  Review of Systems  Positive:  Negative:   Physical Exam  BP (!) 151/104 (BP Location: Right Arm)   Pulse (!) 106   Temp 98.9 F (37.2 C) (Oral)   Resp 20   SpO2 100%  Gen:   Awake, no distress   Resp:  Normal effort  MSK:   Moves extremities without difficulty  Other:  Abdomen is soft, nondistended, tender to the left upper quadrant and left lower quadrant.  Medical Decision Making  Medically screening exam initiated at 9:23 PM.  Appropriate orders placed.  Leland L Winstead was informed that the remainder of the evaluation will be completed by another provider, this initial triage assessment does not replace that evaluation, and the importance of remaining in the ED until their evaluation is complete.     Janell Quiet, [place] 10/11/21 2159

## 2021-10-12 ENCOUNTER — Emergency Department (HOSPITAL_COMMUNITY): Payer: Commercial Managed Care - HMO

## 2021-10-12 DIAGNOSIS — R1032 Left lower quadrant pain: Secondary | ICD-10-CM | POA: Diagnosis not present

## 2021-10-12 LAB — PREGNANCY, URINE: Preg Test, Ur: NEGATIVE

## 2021-10-12 LAB — LIPASE, BLOOD: Lipase: 27 U/L (ref 11–51)

## 2021-10-12 MED ORDER — HALOPERIDOL LACTATE 5 MG/ML IJ SOLN
2.0000 mg | Freq: Once | INTRAMUSCULAR | Status: AC
  Administered 2021-10-12: 2 mg via INTRAVENOUS
  Filled 2021-10-12: qty 1

## 2021-10-12 MED ORDER — HYDROMORPHONE HCL 1 MG/ML IJ SOLN
1.0000 mg | Freq: Once | INTRAMUSCULAR | Status: AC
  Administered 2021-10-12: 1 mg via INTRAVENOUS
  Filled 2021-10-12: qty 1

## 2021-10-12 MED ORDER — ONDANSETRON 4 MG PO TBDP: 4.0000 mg | ORAL_TABLET | Freq: Three times a day (TID) | ORAL | 0 refills | Status: DC | PRN

## 2021-10-12 MED ORDER — SODIUM CHLORIDE 0.9 % IV BOLUS
1000.0000 mL | Freq: Once | INTRAVENOUS | Status: AC
  Administered 2021-10-12: 1000 mL via INTRAVENOUS

## 2021-10-12 NOTE — ED Provider Notes (Signed)
[facility]ENC[facility]PT [facility] Emergency Department Provider Note MRN:  [id number]  Arrival date & time: 10/12/21     Chief Complaint   Abdominal pain History of Present Illness   Melissa [name] is a 41 y.o. year-old female with a history of IBS, fibromyalgia presenting to the ED with chief complaint of abdominal pain.  Patient is used to having pain in various parts of the body.  Over the past 5 days she has been having more severe left lower quadrant abdominal pain, does not seem consistent with her normal fibromyalgia pain.  Nausea but no vomiting, no diarrhea, no fever, no other significant changes to her fibromyalgia pain.  Review of Systems  A thorough review of systems was obtained and all systems are negative except as noted in the HPI and PMH.   Patient's Health History    Past Medical History:  Diagnosis Date   Asthma    Chest pain of uncertain etiology 06/08/2021   Fibromyalgia    IBS (irritable bowel syndrome)    Migraine    Pure hypercholesterolemia 06/08/2021    Past Surgical History:  Procedure Laterality Date   [facility]  2008    Family History  Problem Relation Age of Onset   Heart disease Mother    Arthritis Mother    Diabetes Mother    High Cholesterol Mother    Migraines Mother    Cancer Father    High blood pressure Father    High Cholesterol Sister    High blood pressure Sister    Kidney disease Maternal Grandmother    Heart disease Maternal Grandfather    Cancer Maternal Grandfather    High Cholesterol Paternal Grandmother    High blood pressure Paternal Grandmother    High blood pressure Paternal Grandfather     Social History   Socioeconomic History   Marital status: Single    Spouse name: Not on file   Number of children: 0   Years of education: Not on file   Highest education level: Bachelor's degree (e.g., BA, AB, BS)  Occupational History   Occupation: Marine scientist: [employer]  Tobacco Use   Smoking status: Former     Types: Cigars    Quit date: 01/01/2018    Years since quitting: 3.7   Smokeless tobacco: Never   Tobacco comments:    Black and Milds  Vaping Use   Vaping Use: Never used  Substance and Sexual Activity   Alcohol use: Yes    Alcohol/week: 2.0 standard drinks of alcohol    Types: 2 Cans of beer per week   Drug use: No    Comment: uses CBD   Sexual activity: Not Currently  Other Topics Concern   Not on file  Social History Narrative   Patient is right-handed. She lives in a single level home. Her mother lives with her. She does not exercise.   Caffeine: Kombucha   Social Determinants of Health   Financial Resource Strain: Low Risk  (06/08/2021)   Overall Financial Resource Strain (CARDIA)    Difficulty of Paying Living Expenses: Not hard at all  Food Insecurity: No Food Insecurity (06/08/2021)   Hunger Vital Sign    Worried About Running Out of Food in the Last Year: Never true    Ran Out of Food in the Last Year: Never true  Transportation Needs: No Transportation Needs (06/08/2021)   PRAPARE - Transportation    Lack of Transportation (Medical): No    Lack of  Transportation (Non-Medical): No  Physical Activity: Inactive (06/08/2021)   Exercise Vital Sign    Days of Exercise per Week: 0 days    Minutes of Exercise per Session: 0 min  Stress: Not on file  Social Connections: Not on file  Intimate Partner Violence: Not on file     Physical Exam   Vitals:   10/12/21 0400 10/12/21 0415  BP: (!) 127/95 (!) 128/98  Pulse: 75 72  Resp:  14  Temp:    SpO2: 97% 99%    CONSTITUTIONAL: Well-appearing, NAD NEURO/PSYCH:  Alert and oriented x 3, no focal deficits EYES:  eyes equal and reactive ENT/NECK:  no LAD, no JVD CARDIO: Regular rate, well-perfused, normal S1 and S2 PULM:  CTAB no wheezing or rhonchi GI/GU:  non-distended, moderate left lower quadrant tenderness to palpation MSK/SPINE:  No gross deformities, no edema SKIN:  no rash, atraumatic   *Additional and/or  pertinent findings included in MDM below  Diagnostic and Interventional Summary    EKG Interpretation  Date/Time:    Ventricular Rate:    PR Interval:    QRS Duration:   QT Interval:    QTC Calculation:   R Axis:     Text Interpretation:         Labs Reviewed  COMPREHENSIVE METABOLIC PANEL - Abnormal; Notable for the following components:      Result Value   Total Protein 8.2 (*)    All other components within normal limits  URINALYSIS, ROUTINE W REFLEX MICROSCOPIC - Abnormal; Notable for the following components:   Color, Urine STRAW (*)    Specific Gravity, Urine 1.002 (*)    All other components within normal limits  RESP PANEL BY RT-PCR (FLU A&B, COVID) ARPGX2  CBC WITH DIFFERENTIAL/PLATELET  LIPASE, BLOOD  PREGNANCY, URINE    CT ABDOMEN PELVIS WO CONTRAST  Final Result      Medications  sodium chloride 0.9 % bolus 1,000 mL (1,000 mLs Intravenous New Bag/Given 10/12/21 0220)  HYDROmorphone (DILAUDID) injection 1 mg (1 mg Intravenous Given 10/12/21 0222)  haloperidol lactate (HALDOL) injection 2 mg (2 mg Intravenous Given 10/12/21 0344)     Procedures  /  Critical Care Procedures  ED Course and Medical Decision Making  Initial Impression and Ddx Left lower quadrant abdominal pain, differential diagnosis includes diverticulitis, ovarian cyst, kidney stone, UTI.  Doubt torsion.  Awaiting labs, CT.  Past medical/surgical history that increases complexity of ED encounter: Fibromyalgia  Interpretation of Diagnostics I personally reviewed the laboratory assessment and my interpretation is as follows: No significant blood count or electrolyte disturbance  CT imaging is without acute process  Patient Reassessment and Ultimate Disposition/Management     Patient feeling much better after medications listed above, appropriate for discharge.  Patient management required discussion with the following services or consulting groups:  None  Complexity of Problems  Addressed Acute illness or injury that poses threat of life of bodily function  Additional Data Reviewed and Analyzed Further history obtained from: Further history from spouse/family member  Additional Factors Impacting ED Encounter Risk Prescriptions  Elmer Sow. Pilar Plate, MD [facility] [email or web address]  Final Clinical Impressions(s) / ED Diagnoses     ICD-10-CM   1. Left lower quadrant abdominal pain  R10.32       ED Discharge Orders          Ordered    ondansetron (ZOFRAN-ODT) 4 MG disintegrating tablet  Every 8 hours PRN  10/12/21 0443             Discharge Instructions Discussed with and Provided to Patient:     Discharge Instructions      You were evaluated in the Emergency Department and after careful evaluation, we did not find any emergent condition requiring admission or further testing in the hospital.  Your exam/testing today is overall reassuring.  Recommend using Zofran as needed for nausea, recommend using your home Bentyl for abdominal discomfort and discussing your symptoms with your primary care doctor.  Please return to the Emergency Department if you experience any worsening of your condition.   Thank you for allowing [place] to be a part of your care.       Sabas Sous, MD 10/12/21 [id number]

## 2021-10-12 NOTE — Discharge Instructions (Signed)
You were evaluated in the Emergency Department and after careful evaluation, we did not find any emergent condition requiring admission or further testing in the hospital.  Your exam/testing today is overall reassuring.  Recommend using Zofran as needed for nausea, recommend using your home Bentyl for abdominal discomfort and discussing your symptoms with your primary care doctor.  Please return to the Emergency Department if you experience any worsening of your condition.   Thank you for allowing [place] to be a part of your care.

## 2021-11-02 ENCOUNTER — Telehealth: Payer: Self-pay | Admitting: Adult Health

## 2021-11-02 NOTE — Telephone Encounter (Signed)
Pt is calling. Stated she need a prior authorization for medication  AJOVY 225 MG/1.5ML SOAJ.

## 2021-11-03 ENCOUNTER — Other Ambulatory Visit: Payer: Self-pay | Admitting: Adult Health

## 2021-11-03 NOTE — Telephone Encounter (Signed)
Called pt & LVM (ok per DPR) advising Ajovy has been approved through 11/03/2022.

## 2021-11-03 NOTE — Telephone Encounter (Signed)
Completed Ajovy continuation PA on Cover My Meds. Key: [id number]. Awaiting determination from [facility].

## 2021-11-03 NOTE — Telephone Encounter (Signed)
Approved today [id number];Review Type:Prior Auth;Coverage Start Date:11/03/2021;Coverage End Date:11/03/2022;

## 2021-11-09 ENCOUNTER — Other Ambulatory Visit: Payer: Self-pay | Admitting: Family Medicine

## 2021-11-09 ENCOUNTER — Other Ambulatory Visit: Payer: Self-pay | Admitting: Physician Assistant

## 2021-11-09 NOTE — Telephone Encounter (Signed)
[facility] is requesting to fill pt flexeril. Please advise [facility] ?

## 2021-11-26 ENCOUNTER — Other Ambulatory Visit: Payer: Self-pay | Admitting: Physician Assistant

## 2021-11-29 ENCOUNTER — Other Ambulatory Visit: Payer: Self-pay | Admitting: Neurology

## 2021-11-29 DIAGNOSIS — G43711 Chronic migraine without aura, intractable, with status migrainosus: Secondary | ICD-10-CM

## 2021-12-01 ENCOUNTER — Other Ambulatory Visit: Payer: Self-pay | Admitting: Physician Assistant

## 2022-01-06 ENCOUNTER — Other Ambulatory Visit: Payer: Self-pay | Admitting: Physician Assistant

## 2022-01-25 ENCOUNTER — Other Ambulatory Visit: Payer: Self-pay | Admitting: Adult Health

## 2022-01-25 NOTE — Telephone Encounter (Signed)
Rx sent 

## 2022-02-11 ENCOUNTER — Encounter (HOSPITAL_BASED_OUTPATIENT_CLINIC_OR_DEPARTMENT_OTHER): Payer: Self-pay | Admitting: Cardiovascular Disease

## 2022-02-11 ENCOUNTER — Ambulatory Visit (HOSPITAL_BASED_OUTPATIENT_CLINIC_OR_DEPARTMENT_OTHER): Payer: Commercial Managed Care - HMO | Admitting: Cardiovascular Disease

## 2022-02-11 VITALS — BP 132/88 | HR 88 | Ht 67.0 in | Wt 174.2 lb

## 2022-02-11 DIAGNOSIS — E7800 Pure hypercholesterolemia, unspecified: Secondary | ICD-10-CM | POA: Diagnosis not present

## 2022-02-11 DIAGNOSIS — R079 Chest pain, unspecified: Secondary | ICD-10-CM

## 2022-02-11 DIAGNOSIS — R030 Elevated blood-pressure reading, without diagnosis of hypertension: Secondary | ICD-10-CM

## 2022-02-11 HISTORY — DX: Elevated blood-pressure reading, without diagnosis of hypertension: R03.0

## 2022-02-11 NOTE — Assessment & Plan Note (Signed)
No significant CAD on her coronary calcium score and her stress test was negative.  Given that she did have a very small amount of plaque recommend continuing her statin.  Encourage exercise as able as above.  Her chest pain is noncardiac and likely related to her fibromyalgia.

## 2022-02-11 NOTE — Assessment & Plan Note (Signed)
Continue CPAP.

## 2022-02-11 NOTE — Assessment & Plan Note (Signed)
Lipids are well-controlled.  Continue rosuvastatin.  Recommended trying to get exercise with water therapy as she is unable to otherwise tolerate exercise.  Continue working on diet.

## 2022-02-11 NOTE — Assessment & Plan Note (Signed)
Patient's blood pressure is elevated today and has been elevated in the past.  She struggles with fibromyalgia and is in constant pain.  We discussed the fact that we need to work on her pain control and also check her blood pressures at home to make sure that they are not elevated.  Her blood pressure goal is less than 130/80.  She has follow-up with her PCP next month.  Recommended that she bring the log of her blood pressure to her appointment.

## 2022-02-11 NOTE — Patient Instructions (Signed)
Medication Instructions:  Continue current medications  *If you need a refill on your cardiac medications before your next appointment, please call your pharmacy*   Lab Work: None Ordered    Testing/Procedures: None Ordered   Follow-Up: At [facility], you and your health needs are our priority.  As part of our continuing mission to provide you with exceptional heart care, we have created designated Provider Care Teams.  These Care Teams include your primary Cardiologist (physician) and Advanced Practice Providers (APPs -  Physician Assistants and Nurse Practitioners) who all work together to provide you with the care you need, when you need it.  We recommend signing up for the patient portal called "MyChart".  Sign up information is provided on this After Visit Summary.  MyChart is used to connect with patients for Virtual Visits (Telemedicine).  Patients are able to view lab/test results, encounter notes, upcoming appointments, etc.  Non-urgent messages can be sent to your provider as well.   To learn more about what you can do with MyChart, go to [email or web address].    Your next appointment:   As Needed       

## 2022-02-11 NOTE — Progress Notes (Signed)
Cardiology Office Note:    Date:  02/11/2022   ID:  Melissa [name], DOB 1980-11-20, MRN [id number]  PCP:  Sandi Mariscal, MD   [facility] Providers Cardiologist:  None     Referring MD: No ref. provider found   No chief complaint on file.   History of Present Illness:    Melissa [name] is a 42 y.o. female with a hx of hyperlipidemia, prediabetes, asthma, fibromyalgia, and IBS, here for follow up. We initially saw her 05/2021 for the evaluation of chest pain. She saw Dr. Jerline Pain 05/04/2021 and reported sharp chest pain that occurred frequently. Her chest pain was thought to be atypical for cardiac etiology, possibly related to fibromyalgia. However, due to family history and risk factors including prediabetes and dyslipidemia she was referred to cardiology for further evaluation. She also noted bilateral leg pain thought to be more consistent with neuropathic pain. ABIs were ordered and were negative. Her chest pain was atypical and seemed non-ischemic. Due to her contrast allergy we got a calcium score which was 7. Nuclear stress test was low risk.   Today, she is accompanied by a family member. She states she is feeling fine with no recurring chest discomfort. In clinic today her blood pressure is 124/92 (132/88 on recheck). Usually she doesn't monitor her BP at home due to her chronic pain. At this time she complains of bilateral hand pain. Lately she has not been very active. Most of her day is spent in bed. When she tries to exercise she develops flares of fibromyalgia. She confirms her regimen includes Flexeril, doxepin, and Lyrica. Additionally she complains of LE edema for about 3 days out of a typical week. Her swelling usually occurs by the afternoons. Currently she is wearing knee high compression socks which helps a little bit. She denies any palpitations, chest pain, shortness of breath, lightheadedness, headaches, syncope, orthopnea, or PND.   Past Medical History:  Diagnosis Date    Asthma    Chest pain of uncertain etiology 63/78/5885   Elevated blood pressure reading 02/11/2022   Fibromyalgia    IBS (irritable bowel syndrome)    Migraine    Pure hypercholesterolemia 06/08/2021    Past Surgical History:  Procedure Laterality Date   [facility]  2008    Current Medications: Current Meds  Medication Sig   AJOVY 225 MG/1.5ML SOAJ INJECT 225 MG(1.5 ML) INTO THE SKIN EVERY 30 DAYS.   albuterol (PROVENTIL) (2.5 MG/3ML) 0.083% nebulizer solution Take 3 mLs (2.5 mg total) by nebulization every 6 (six) hours as needed for wheezing or shortness of breath.   albuterol (VENTOLIN HFA) 108 (90 Base) MCG/ACT inhaler TAKE 2 PUFFS BY MOUTH EVERY 6 HOURS AS NEEDED FOR WHEEZE OR SHORTNESS OF BREATH   Ascorbic Acid (VITAMIN C) 1000 MG tablet Take 1,000 mg by mouth daily.   budesonide-formoterol (SYMBICORT) 80-4.5 MCG/ACT inhaler Inhale 2 puffs into the lungs 2 (two) times daily.   CHLOROPHYLL PO Take by mouth.   cholecalciferol (VITAMIN D) 1000 units tablet Take 1,000 Units by mouth daily.   COD LIVER OIL PO Take by mouth.   cyclobenzaprine (FLEXERIL) 10 MG tablet TAKE 1 TABLET BY MOUTH THREE TIMES A DAY AS NEEDED FOR MUSCLE SPASMS   dicyclomine (BENTYL) 20 MG tablet TAKE 1 TABLET BY MOUTH 4 TIMES DAILY - BEFORE MEALS AND AT BEDTIME.   doxepin (SINEQUAN) 10 MG capsule Take 10 mg by mouth at bedtime.   escitalopram (LEXAPRO) 20 MG tablet TAKE 1 TABLET BY MOUTH  EVERY DAY   HYDROcodone-acetaminophen (NORCO/VICODIN) 5-325 MG tablet Take 1 tablet by mouth every 4 (four) hours as needed.   meclizine (ANTIVERT) 25 MG tablet TAKE 1 TABLET (25 MG TOTAL) BY MOUTH 2 (TWO) TIMES DAILY AS NEEDED FOR DIZZINESS.   Multiple Vitamin (MULTIVITAMIN) tablet Take 1 tablet by mouth daily.   Omega-3 Fatty Acids (FISH OIL) 1000 MG CAPS Take by mouth.   ondansetron (ZOFRAN-ODT) 4 MG disintegrating tablet Take 1 tablet (4 mg total) by mouth every 8 (eight) hours as needed for nausea or vomiting.   pantoprazole  (PROTONIX) 40 MG tablet TAKE 1 TABLET BY MOUTH EVERY DAY   pregabalin (LYRICA) 75 MG capsule Take 1 capsule (75 mg total) by mouth 2 (two) times daily.   promethazine (PHENERGAN) 12.5 MG tablet Take 1 tablet (12.5 mg total) by mouth every 8 (eight) hours as needed for nausea or vomiting.   Riboflavin (B-2-400 PO) Take by mouth.   rosuvastatin (CRESTOR) 20 MG tablet TAKE 1 TABLET BY MOUTH EVERY DAY   SUMAtriptan (IMITREX) 50 MG tablet TAKE 1 TABLET DAILY AS NEEDED FOR MIGRAINE. MAY REPEAT IN 2 HOURS IF HEADACHE PERSISTS OR RECURS.   zolpidem (AMBIEN) 10 MG tablet Take 1 tablet (10 mg total) by mouth at bedtime as needed for sleep.   Current Facility-Administered Medications for the 02/11/22 encounter (Office Visit) with Chilton Si, MD  Medication   Fremanezumab-vfrm SOSY 25 mg     Allergies:   Tomato, Asa [aspirin], Iodinated contrast media, Latex, Penicillins, and Vitamin e   Social History   Socioeconomic History   Marital status: Single    Spouse name: Not on file   Number of children: 0   Years of education: Not on file   Highest education level: Bachelor's degree (e.g., BA, AB, BS)  Occupational History   Occupation: Marine scientist: [employer]  Tobacco Use   Smoking status: Former    Types: Cigars    Quit date: 01/01/2018    Years since quitting: 4.1   Smokeless tobacco: Never   Tobacco comments:    Black and Milds  Vaping Use   Vaping Use: Never used  Substance and Sexual Activity   Alcohol use: Yes    Alcohol/week: 2.0 standard drinks of alcohol    Types: 2 Cans of beer per week   Drug use: No    Comment: uses CBD   Sexual activity: Not Currently  Other Topics Concern   Not on file  Social History Narrative   Patient is right-handed. She lives in a single level home. Her mother lives with her. She does not exercise.   Caffeine: Kombucha   Social Determinants of Health   Financial Resource Strain: Low Risk  (06/08/2021)   Overall Financial Resource  Strain (CARDIA)    Difficulty of Paying Living Expenses: Not hard at all  Food Insecurity: No Food Insecurity (06/08/2021)   Hunger Vital Sign    Worried About Running Out of Food in the Last Year: Never true    Ran Out of Food in the Last Year: Never true  Transportation Needs: No Transportation Needs (06/08/2021)   PRAPARE - Administrator, Civil Service (Medical): No    Lack of Transportation (Non-Medical): No  Physical Activity: Inactive (06/08/2021)   Exercise Vital Sign    Days of Exercise per Week: 0 days    Minutes of Exercise per Session: 0 min  Stress: Not on file  Social Connections: Not on file  Family History: The patient's family history includes Arthritis in her mother; Cancer in her father and maternal grandfather; Diabetes in her mother; Heart disease in her maternal grandfather and mother; High Cholesterol in her mother, paternal grandmother, and sister; High blood pressure in her father, paternal grandfather, paternal grandmother, and sister; Kidney disease in her maternal grandmother; Migraines in her mother.  ROS:   Please see the history of present illness.    (+) Myalgias (+) LE edema (+) Bilateral hand pain All other systems reviewed and are negative.  EKGs/Labs/Other Studies Reviewed:    The following studies were reviewed today:  Exercise Stress Myoview  07/07/2021:   The study is normal. The study is low risk.   No ST deviation was noted.   LV perfusion is normal.   Left ventricular function is abnormal. Global function is mildly reduced. Nuclear stress EF: 54 %. The left ventricular ejection fraction is mildly decreased (45-54%). End diastolic cavity size is normal.   Prior study not available for comparison.   Low risk stress nuclear study with normal perfusion and borderline low left ventricular global systolic function.  CT Calcium Scoring  07/07/2021: FINDINGS: Coronary arteries: Normal origins.   Coronary Calcium Score:   Left main:  0   Left anterior descending artery: 0   Left circumflex artery: 0   Right coronary artery: 7   Total: 7   Percentile: NA--outside age window   Pericardium: Normal.   Aorta: Normal caliber of ascending aorta. Aortic arch atherosclerosis noted.   Non-cardiac: See separate report from [facility].   IMPRESSION: Coronary calcium score of 7. This was N/A percentile for age-, race-, and sex-matched controls (age range starts at 79). Mild aortic arch atherosclerosis.  ABI Doppler 05/24/2021: Summary:  Right: Resting right ankle-brachial index is within normal range. No  evidence of significant right lower extremity arterial disease. The right  toe-brachial index is normal.   Left: Resting left ankle-brachial index is within normal range. No  evidence of significant left lower extremity arterial disease. The left  toe-brachial index is normal.   CTA Chest 06/22/2011 [facility]): Findings:   The aorta is seen without any evidence of aneurysm, dissection, or aortic  injury. The thyroid is within normal limits. The tracheobronchial tree is  within normal limits. The mediastinum is visualized without any mass or  collection. The heart is stable  in size.   There is no pulmonary embolus. There is no pneumothorax. There is no  pleural effusion. The lungs are clear bilaterally. Osseous structures  within normal limits. Subcutaneous tissues are within normal limits.   IMPRESSION  Impression:   Unremarkable CT examination of the chest. No evidence of pulmonary  embolism.    EKG:   EKG is personally reviewed. 02/11/2022:  Sinus rhythm. Rate 88 bpm. 06/08/2021: Sinus rhythm. Rate 83 bpm. Nonspecific ST changes.  Recent Labs: 05/04/2021: TSH 1.44 10/11/2021: ALT 27; BUN 8; Creatinine, Ser 0.73; Hemoglobin 13.7; Platelets 264; Potassium 4.4; Sodium 137   Recent Lipid Panel    Component Value Date/Time   CHOL 136 07/28/2021 1039   TRIG 102 07/28/2021 1039   HDL 45  07/28/2021 1039   CHOLHDL 3.0 07/28/2021 1039   CHOLHDL 5 07/09/2020 1201   VLDL 42.2 (H) 07/09/2020 1201   LDLCALC 72 07/28/2021 1039   LDLCALC 170 (H) 11/09/2018 1534   LDLDIRECT 164.0 07/09/2020 1201        Physical Exam:    Wt Readings from Last 3 Encounters:  02/11/22 174  lb 3.2 oz (79 kg)  10/07/21 176 lb 3.2 oz (79.9 kg)  08/06/21 177 lb (80.3 kg)     VS:  BP 132/88 (BP Location: Right Arm, Patient Position: Sitting, Cuff Size: Normal)   Pulse 88   Ht 5\' 7"  (1.702 m)   Wt 174 lb 3.2 oz (79 kg)   BMI 27.28 kg/m  , BMI Body mass index is 27.28 kg/m. GENERAL:  Well appearing HEENT: Pupils equal round and reactive, fundi not visualized, oral mucosa unremarkable NECK:  No jugular venous distention, waveform within normal limits, carotid upstroke brisk and symmetric, no bruits, no thyromegaly LUNGS:  Clear to auscultation bilaterally HEART:  RRR.  PMI not displaced or sustained,S1 and S2 within normal limits, no S3, no S4, no clicks, no rubs, no murmurs ABD:  Flat, positive bowel sounds normal in frequency in pitch, no bruits, no rebound, no guarding, no midline pulsatile mass, no hepatomegaly, no splenomegaly EXT:  2 plus pulses throughout, no edema, no cyanosis no clubbing SKIN:  No rashes no nodules NEURO:  Cranial nerves II through XII grossly intact, motor grossly intact throughout PSYCH:  Cognitively intact, oriented to person place and time   ASSESSMENT:    1. Pure hypercholesterolemia   2. Elevated blood pressure reading   3. Chest pain of uncertain etiology     PLAN:    Pure hypercholesterolemia Lipids are well-controlled.  Continue rosuvastatin.  Recommended trying to get exercise with water therapy as she is unable to otherwise tolerate exercise.  Continue working on diet.  OSA on CPAP Continue CPAP.  Elevated blood pressure reading Patient's blood pressure is elevated today and has been elevated in the past.  She struggles with fibromyalgia and is in  constant pain.  We discussed the fact that we need to work on her pain control and also check her blood pressures at home to make sure that they are not elevated.  Her blood pressure goal is less than 130/80.  She has follow-up with her PCP next month.  Recommended that she bring the log of her blood pressure to her appointment.  Chest pain of uncertain etiology No significant CAD on her coronary calcium score and her stress test was negative.  Given that she did have a very small amount of plaque recommend continuing her statin.  Encourage exercise as able as above.  Her chest pain is noncardiac and likely related to her fibromyalgia.   Disposition: FU with Ilona Colley C. Oval Linsey, MD, [facility] as needed.  Medication Adjustments/Labs and Tests Ordered: Current medicines are reviewed at length with the patient today.  Concerns regarding medicines are outlined above.   Orders Placed This Encounter  Procedures   EKG 12-Lead   No orders of the defined types were placed in this encounter.  Patient Instructions  Medication Instructions:  Continue current medications  *If you need a refill on your cardiac medications before your next appointment, please call your pharmacy*   Lab Work: None Ordered   Testing/Procedures: None Ordered   Follow-Up: At [facility], you and your health needs are our priority.  As part of our continuing mission to provide you with exceptional heart care, we have created designated Provider Care Teams.  These Care Teams include your primary Cardiologist (physician) and Advanced Practice Providers (APPs -  Physician Assistants and Nurse Practitioners) who all work together to provide you with the care you need, when you need it.  We recommend signing up for the patient portal called "MyChart".  Sign up information is provided on this After Visit Summary.  MyChart is used to connect with patients for Virtual Visits (Telemedicine).  Patients are able to view  lab/test results, encounter notes, upcoming appointments, etc.  Non-urgent messages can be sent to your provider as well.   To learn more about what you can do with MyChart, go to [email or web address].    Your next appointment:   As Needed     I,Mathew Stumpf,acting as a scribe for Chilton Si, MD.,have documented all relevant documentation on the behalf of Chilton Si, MD,as directed by  Chilton Si, MD while in the presence of Chilton Si, MD.  I, Laneya Gasaway C. Duke Salvia, MD have reviewed all documentation for this visit.  The documentation of the exam, diagnosis, procedures, and orders on 02/11/2022 are all accurate and complete.  Signed, Chilton Si, MD  02/11/2022 10:07 AM    [facility]

## 2022-03-29 ENCOUNTER — Other Ambulatory Visit: Payer: Self-pay | Admitting: Family Medicine

## 2022-03-29 ENCOUNTER — Other Ambulatory Visit: Payer: Self-pay | Admitting: Physician Assistant

## 2022-03-30 ENCOUNTER — Telehealth (HOSPITAL_BASED_OUTPATIENT_CLINIC_OR_DEPARTMENT_OTHER): Payer: Self-pay | Admitting: Cardiovascular Disease

## 2022-03-30 MED ORDER — ROSUVASTATIN CALCIUM 20 MG PO TABS: 20.0000 mg | ORAL_TABLET | Freq: Every day | ORAL | 0 refills | Status: DC

## 2022-03-30 NOTE — Telephone Encounter (Signed)
*  STAT* If patient is at the pharmacy, call can be transferred to refill team.   1. Which medications need to be refilled? (please list name of each medication and dose if known) rosuvastatin (CRESTOR) 20 MG tablet   2. Which pharmacy/location (including street and city if local pharmacy) is medication to be sent to? [id number] [place],  [address]   3. Do they need a 30 day or 90 day supply? [address]

## 2022-04-04 ENCOUNTER — Ambulatory Visit: Payer: Commercial Managed Care - HMO | Admitting: Neurology

## 2022-04-21 ENCOUNTER — Other Ambulatory Visit: Payer: Self-pay | Admitting: Internal Medicine

## 2022-04-21 DIAGNOSIS — Z1231 Encounter for screening mammogram for malignant neoplasm of breast: Secondary | ICD-10-CM

## 2022-04-25 ENCOUNTER — Ambulatory Visit: Payer: Commercial Managed Care - HMO | Admitting: Neurology

## 2022-04-25 ENCOUNTER — Telehealth: Payer: Self-pay | Admitting: Neurology

## 2022-04-25 NOTE — Telephone Encounter (Signed)
Unable to LVM due to invalid phone number, sent mychart msg informing pt of needing to reschedule 04/25/22 appointment - MD out

## 2022-05-13 ENCOUNTER — Encounter: Payer: Self-pay | Admitting: Neurology

## 2022-05-13 ENCOUNTER — Ambulatory Visit: Payer: Commercial Managed Care - HMO | Admitting: Neurology

## 2022-05-13 VITALS — BP 123/91 | HR 89 | Ht 67.0 in | Wt 165.8 lb

## 2022-05-13 DIAGNOSIS — R29898 Other symptoms and signs involving the musculoskeletal system: Secondary | ICD-10-CM

## 2022-05-13 DIAGNOSIS — R200 Anesthesia of skin: Secondary | ICD-10-CM

## 2022-05-13 DIAGNOSIS — R258 Other abnormal involuntary movements: Secondary | ICD-10-CM

## 2022-05-13 DIAGNOSIS — R292 Abnormal reflex: Secondary | ICD-10-CM

## 2022-05-13 DIAGNOSIS — G8191 Hemiplegia, unspecified affecting right dominant side: Secondary | ICD-10-CM | POA: Diagnosis not present

## 2022-05-13 DIAGNOSIS — R569 Unspecified convulsions: Secondary | ICD-10-CM

## 2022-05-13 DIAGNOSIS — Z0189 Encounter for other specified special examinations: Secondary | ICD-10-CM

## 2022-05-13 DIAGNOSIS — R270 Ataxia, unspecified: Secondary | ICD-10-CM

## 2022-05-13 DIAGNOSIS — R413 Other amnesia: Secondary | ICD-10-CM | POA: Diagnosis not present

## 2022-05-13 DIAGNOSIS — R251 Tremor, unspecified: Secondary | ICD-10-CM | POA: Diagnosis not present

## 2022-05-13 DIAGNOSIS — G1229 Other motor neuron disease: Secondary | ICD-10-CM
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
## 2022-05-13 NOTE — Patient Instructions (Signed)
MRI of the brain and cervical spine EEG

## 2022-05-16 ENCOUNTER — Telehealth: Payer: Self-pay | Admitting: Neurology

## 2022-05-16 NOTE — Telephone Encounter (Signed)
Can you call and schedule her for eeg please

## 2022-05-17 ENCOUNTER — Telehealth: Payer: Self-pay | Admitting: Neurology

## 2022-05-17 NOTE — Telephone Encounter (Signed)
[employer] sent to [facility] they obtain auth [id number] 

## 2022-05-17 NOTE — Telephone Encounter (Signed)
LVM asking pt to call back to schedule EEG 

## 2022-05-26 ENCOUNTER — Encounter: Payer: Self-pay | Admitting: Neurology

## 2022-06-02 NOTE — Telephone Encounter (Signed)
Pt called and stated that her insurance does not cover the EEG and that is why she has not schedule it.

## 2022-06-07 ENCOUNTER — Other Ambulatory Visit: Payer: Commercial Managed Care - HMO | Admitting: *Deleted

## 2022-07-01 ENCOUNTER — Other Ambulatory Visit: Payer: Self-pay | Admitting: Adult Health

## 2022-07-04 ENCOUNTER — Ambulatory Visit
Admission: RE | Admit: 2022-07-04 | Discharge: 2022-07-04 | Disposition: A | Discharge status: Home / Self Care | Payer: Commercial Managed Care - HMO | Source: Ambulatory Visit | Attending: Neurology | Admitting: Neurology

## 2022-07-04 DIAGNOSIS — R258 Other abnormal involuntary movements: Secondary | ICD-10-CM

## 2022-07-04 DIAGNOSIS — R270 Ataxia, unspecified: Secondary | ICD-10-CM

## 2022-07-04 DIAGNOSIS — R200 Anesthesia of skin: Secondary | ICD-10-CM

## 2022-07-04 DIAGNOSIS — G1229 Other motor neuron disease: Secondary | ICD-10-CM

## 2022-07-04 DIAGNOSIS — R413 Other amnesia: Secondary | ICD-10-CM

## 2022-07-04 DIAGNOSIS — R292 Abnormal reflex: Secondary | ICD-10-CM

## 2022-07-04 DIAGNOSIS — Z0189 Encounter for other specified special examinations: Secondary | ICD-10-CM

## 2022-07-04 DIAGNOSIS — R29898 Other symptoms and signs involving the musculoskeletal system: Secondary | ICD-10-CM

## 2022-07-04 DIAGNOSIS — G8191 Hemiplegia, unspecified affecting right dominant side: Secondary | ICD-10-CM

## 2022-07-04 DIAGNOSIS — R569 Unspecified convulsions: Secondary | ICD-10-CM

## 2022-07-04 DIAGNOSIS — R251 Tremor, unspecified: Secondary | ICD-10-CM

## 2022-07-04 MED ORDER — GADOPICLENOL 0.5 MMOL/ML IV SOLN
8.0000 mL | Freq: Once | INTRAVENOUS | Status: AC | PRN
  Administered 2022-07-04: 8 mL via INTRAVENOUS

## 2022-07-06 NOTE — Telephone Encounter (Signed)
Pt last seen on 05/13/22  Per note on 10/07/21 "Start Imitrex for abortive therapy "  Last filled on 06/01/22 #9 tablets (30 day supply)

## 2022-07-14 ENCOUNTER — Other Ambulatory Visit: Payer: Self-pay | Admitting: Family Medicine

## 2022-07-18 ENCOUNTER — Encounter: Payer: Self-pay | Admitting: Neurology

## 2022-07-28 ENCOUNTER — Other Ambulatory Visit (HOSPITAL_BASED_OUTPATIENT_CLINIC_OR_DEPARTMENT_OTHER): Payer: Self-pay | Admitting: Cardiovascular Disease

## 2022-07-28 NOTE — Telephone Encounter (Signed)
Rx(s) sent to pharmacy electronically.  

## 2022-08-05 ENCOUNTER — Other Ambulatory Visit: Payer: Self-pay | Admitting: Medical

## 2022-08-05 NOTE — Telephone Encounter (Signed)
Left message for pt to call back to schedule a visit before we could refill her medication

## 2022-08-26 ENCOUNTER — Ambulatory Visit: Admission: RE | Admit: 2022-08-26 | Payer: Commercial Managed Care - HMO | Source: Ambulatory Visit

## 2022-08-26 DIAGNOSIS — Z1231 Encounter for screening mammogram for malignant neoplasm of breast: Secondary | ICD-10-CM

## 2022-08-31 ENCOUNTER — Other Ambulatory Visit: Payer: Self-pay | Admitting: Family Medicine

## 2022-09-19 ENCOUNTER — Ambulatory Visit (INDEPENDENT_AMBULATORY_CARE_PROVIDER_SITE_OTHER): Payer: Commercial Managed Care - HMO | Admitting: Neurology

## 2022-09-19 DIAGNOSIS — G8191 Hemiplegia, unspecified affecting right dominant side: Secondary | ICD-10-CM

## 2022-09-19 DIAGNOSIS — R569 Unspecified convulsions: Secondary | ICD-10-CM

## 2022-09-19 DIAGNOSIS — R4182 Altered mental status, unspecified: Secondary | ICD-10-CM | POA: Diagnosis not present

## 2022-09-19 DIAGNOSIS — G1229 Other motor neuron disease: Secondary | ICD-10-CM

## 2022-09-19 DIAGNOSIS — R413 Other amnesia: Secondary | ICD-10-CM

## 2022-09-23 NOTE — Procedures (Signed)
    History:  42 year old woman with seizure like activity   EEG classification: Awake and drowsy  Duration: 25 minutes  Technical aspects: This EEG study was done with scalp electrodes positioned according to the 10-20 International system of electrode placement. Electrical activity was reviewed with band pass filter of 1-70Hz , sensitivity of 7 uV/mm, display speed of 24mm/sec with a 60Hz  notched filter applied as appropriate. EEG data were recorded continuously and digitally stored.   Description of the recording: The background rhythms of this recording consists of a fairly well modulated medium amplitude alpha rhythm of 9 Hz that is reactive to eye opening and closure. Present in the anterior head region is a 15-20 Hz beta activity. Photic stimulation was performed, did not show any abnormalities. Hyperventilation was also performed, did not show any abnormalities. Drowsiness was manifested by background fragmentation. No abnormal epileptiform discharges seen during this recording. There was no focal slowing. There were no electrographic seizure identified.   Abnormality: None   Impression: This is a normal EEG recorded while drowsy and awake. No evidence of interictal epileptiform discharges. Normal EEGs, however, do not rule out epilepsy.    Windell Norfolk, MD [facility]

## 2022-10-06 ENCOUNTER — Ambulatory Visit
Admission: EM | Admit: 2022-10-06 | Discharge: 2022-10-06 | Disposition: A | Discharge status: Home / Self Care | Payer: Commercial Managed Care - HMO

## 2022-10-06 ENCOUNTER — Telehealth (HOSPITAL_BASED_OUTPATIENT_CLINIC_OR_DEPARTMENT_OTHER): Payer: Self-pay

## 2022-10-06 ENCOUNTER — Encounter: Payer: Self-pay | Admitting: Physician Assistant

## 2022-10-06 DIAGNOSIS — L299 Pruritus, unspecified: Secondary | ICD-10-CM

## 2022-10-06 MED ORDER — ROSUVASTATIN CALCIUM 20 MG PO TABS: 20.0000 mg | ORAL_TABLET | Freq: Every day | ORAL | 3 refills | Status: DC

## 2022-10-06 MED ORDER — DEXAMETHASONE SODIUM PHOSPHATE 10 MG/ML IJ SOLN
10.0000 mg | Freq: Once | INTRAMUSCULAR | Status: AC
  Administered 2022-10-06: 10 mg via INTRAMUSCULAR

## 2022-10-06 NOTE — ED Triage Notes (Addendum)
"  It started about 2 wks ago (after vaccine on the 18th of July) , I got my COVID19 Vaccine and had an adverse reaction (swelling/redness), which went down after 2 days. Following this had a Flu vaccine on the 26th (in opposite arm (right) with swelling and bumps/rash". "These bumps/rash continue in various spots".

## 2022-10-06 NOTE — ED Provider Notes (Signed)
[facility]    CSN: [id number] Arrival date & time: 10/06/22  1038      History   Chief Complaint Chief Complaint  Patient presents with   Urticaria    HPI Melissa [name] is a 42 y.o. female.   Here today for evaluation of possible rash and itching that started after she had received 2 vaccines.  She thinks she got her COVID and flu vaccines and this caused reaction.  She states she did have her COVID-vaccine first on July 18 and then had flu vaccine on August 26.  She states after that she started to have itching and bumps develop to her back and arms.  She has not any fever.  She denies any shortness of breath or trouble swallowing.  The history is provided by the patient.  Urticaria Pertinent negatives include no abdominal pain and no shortness of breath.    Past Medical History:  Diagnosis Date   Asthma    Chest pain of uncertain etiology 06/08/2021   Elevated blood pressure reading 02/11/2022   Fibromyalgia    IBS (irritable bowel syndrome)    Migraine    Pure hypercholesterolemia 06/08/2021    Patient Active Problem List   Diagnosis Date Noted   Elevated blood pressure reading 02/11/2022   Multiple joint pain 06/21/2021   Numbness and tingling 06/21/2021   Chest pain of uncertain etiology 06/08/2021   Pure hypercholesterolemia 06/08/2021   Insomnia 09/21/2020   OSA on CPAP 08/10/2020   Chronic fatigue 07/07/2020   Intractable migraine with status migrainosus 04/07/2020   Hyperglycemia 11/12/2018   Dyslipidemia 11/12/2018   Depression, major, single episode, moderate (HCC) 09/22/2017   Fibromyalgia 09/22/2017   RHINITIS, ALLERGIC 03/30/2006   Asthma 03/30/2006   IRRITABLE BOWEL SYNDROME 03/30/2006    Past Surgical History:  Procedure Laterality Date   [facility]  2008    OB History     Gravida      Para      Term      Preterm      AB      Living  0      SAB      IAB      Ectopic      Multiple      Live Births                Home Medications    Prior to Admission medications   Medication Sig Start Date End Date Taking? Authorizing Provider  AJOVY 225 MG/1.5ML SOAJ INJECT 225 MG(1.5 ML) INTO THE SKIN EVERY 30 DAYS. 11/30/21  Yes Anson Fret, MD  albuterol (PROVENTIL) (2.5 MG/3ML) 0.083% nebulizer solution Take 3 mLs (2.5 mg total) by nebulization every 6 (six) hours as needed for wheezing or shortness of breath. 01/03/19  Yes Ardith Dark, MD  Ascorbic Acid (VITAMIN C) 1000 MG tablet Take 1,000 mg by mouth daily.   Yes [provider]  cholecalciferol (VITAMIN D) 1000 units tablet Take 1,000 Units by mouth daily.   Yes [provider]  COD LIVER OIL PO Take by mouth.   Yes [provider]  cyclobenzaprine (FLEXERIL) 10 MG tablet TAKE 1 TABLET BY MOUTH THREE TIMES A DAY AS NEEDED FOR MUSCLE SPASMS 11/10/21  Yes Tysinger, Kermit Balo, PA-C  dicyclomine (BENTYL) 20 MG tablet TAKE 1 TABLET BY MOUTH 4 TIMES DAILY - BEFORE MEALS AND AT BEDTIME. 08/18/21  Yes Ardith Dark, MD  doxepin (SINEQUAN) 10 MG capsule Take 10  mg by mouth at bedtime. 01/24/22  Yes [provider]  escitalopram (LEXAPRO) 20 MG tablet TAKE 1 TABLET BY MOUTH EVERY DAY 06/04/21  Yes Ardith Dark, MD  meclizine (ANTIVERT) 25 MG tablet TAKE 1 TABLET (25 MG TOTAL) BY MOUTH 2 (TWO) TIMES DAILY AS NEEDED FOR DIZZINESS. 11/09/21  Yes Ardith Dark, MD  Multiple Vitamin (MULTIVITAMIN) tablet Take 1 tablet by mouth daily.   Yes [provider]  Omega-3 Fatty Acids (FISH OIL) 1000 MG CAPS Take by mouth.   Yes [provider]  pantoprazole (PROTONIX) 40 MG tablet TAKE 1 TABLET BY MOUTH EVERY DAY 08/09/21  Yes Ardith Dark, MD  pregabalin (LYRICA) 100 MG capsule Take 100 mg by mouth 2 (two) times daily. 07/02/22  Yes [provider]  promethazine (PHENERGAN) 12.5 MG tablet Take 1 tablet (12.5 mg total) by mouth every 8 (eight) hours as needed for nausea or vomiting. 07/30/21  Yes  Burnard Hawthorne B, PA-C  zolpidem (AMBIEN) 10 MG tablet Take 1 tablet (10 mg total) by mouth at bedtime as needed for sleep. 09/07/21  Yes Ronnald Nian, MD  albuterol (VENTOLIN HFA) 108 (90 Base) MCG/ACT inhaler TAKE 2 PUFFS BY MOUTH EVERY 6 HOURS AS NEEDED FOR WHEEZE OR SHORTNESS OF BREATH 05/04/21   Ardith Dark, MD  budesonide-formoterol [facility]) 80-4.5 MCG/ACT inhaler Inhale 2 puffs into the lungs 2 (two) times daily. 01/03/19   Ardith Dark, MD  CHLOROPHYLL PO Take by mouth.    [provider]  Cyanocobalamin (VITAMIN B-12 PO)     [provider]  ondansetron (ZOFRAN-ODT) 4 MG disintegrating tablet Take 1 tablet (4 mg total) by mouth every 8 (eight) hours as needed for nausea or vomiting. 10/12/21   Sabas Sous, MD  Riboflavin (B-2-400 PO) Take by mouth.    [provider]  rosuvastatin (CRESTOR) 20 MG tablet Take 1 tablet (20 mg total) by mouth daily. 10/06/22   Chilton Si, MD  SUMAtriptan (IMITREX) 50 MG tablet TAKE 1 TABLET BY MOUTH AS NEEDED FOR MIGRAINE. MAY REPEAT IN 2 HRS IF HEADACHE PERSISTS 07/06/22   Butch Penny, NP    Family History Family History  Problem Relation Age of Onset   Heart disease Mother    Arthritis Mother    Diabetes Mother    High Cholesterol Mother    Migraines Mother    Cancer Father    High blood pressure Father    High Cholesterol Sister    High blood pressure Sister    Kidney disease Maternal Grandmother    Heart disease Maternal Grandfather    Cancer Maternal Grandfather    High Cholesterol Paternal Grandmother    High blood pressure Paternal Grandmother    High blood pressure Paternal Grandfather     Social History Social History   Tobacco Use   Smoking status: Former    Types: Cigars    Quit date: 01/01/2018    Years since quitting: 4.7   Smokeless tobacco: Never   Tobacco comments:    Black and Milds  Vaping Use   Vaping status: Never Used  Substance Use Topics   Alcohol use: Yes     Alcohol/week: 2.0 standard drinks of alcohol    Types: 2 Cans of beer per week   Drug use: No    Comment: uses CBD     Allergies   Tomato, Aspirin, Iodinated contrast media, Latex, Other, Penicillins, and Vitamin e   Review of Systems Review of  Systems  Constitutional:  Negative for chills and fever.  HENT:  Negative for trouble swallowing.   Eyes:  Negative for discharge and redness.  Respiratory:  Negative for shortness of breath.   Gastrointestinal:  Negative for abdominal pain, nausea and vomiting.  Skin:  Positive for rash.     Physical Exam Triage Vital Signs ED Triage Vitals  Encounter Vitals Group     BP 10/06/22 1042 (!) 144/94     Systolic BP Percentile --      Diastolic BP Percentile --      Pulse Rate 10/06/22 1042 (!) 108     Resp 10/06/22 1042 18     Temp 10/06/22 1042 98 F (36.7 C)     Temp Source 10/06/22 1042 Oral     SpO2 10/06/22 1042 98 %     Weight 10/06/22 1046 170 lb (77.1 kg)     Height 10/06/22 1046 5\' 7"  (1.702 m)     Head Circumference --      Peak Flow --      Pain Score 10/06/22 1042 0     Pain Loc --      Pain Education --      Exclude from Growth Chart --    No data found.  Updated Vital Signs BP (!) 128/94 (BP Location: Left Arm)   Pulse 96   Temp 98 F (36.7 C) (Oral)   Resp 18   Ht 5\' 7"  (1.702 m)   Wt 170 lb (77.1 kg)   LMP 09/15/2022 (Exact Date)   SpO2 99%   BMI 26.63 kg/m     Physical Exam Vitals and nursing note reviewed.  Constitutional:      General: She is not in acute distress.    Appearance: Normal appearance. She is not ill-appearing.  HENT:     Head: Normocephalic and atraumatic.  Eyes:     Conjunctiva/sclera: Conjunctivae normal.  Cardiovascular:     Rate and Rhythm: Normal rate.  Pulmonary:     Effort: Pulmonary effort is normal. No respiratory distress.  Skin:    Comments: No rash appreciated to areas of itching on back or arms  Neurological:     Mental Status: She is alert.  Psychiatric:         Mood and Affect: Mood normal.        Behavior: Behavior normal.        Thought Content: Thought content normal.      UC Treatments / Results  Labs (all labs ordered are listed, but only abnormal results are displayed) Labs Reviewed - No data to display  EKG   Radiology No results found.  Procedures Procedures (including critical care time)  Medications Ordered in UC Medications  dexamethasone (DECADRON) injection 10 mg (10 mg Intramuscular Given 10/06/22 1115)    Initial Impression / Assessment and Plan / UC Course  I have reviewed the triage vital signs and the nursing notes.  Pertinent labs & imaging results that were available during my care of the patient were reviewed by me and considered in my medical decision making (see chart for details).    Unclear etiology of itching but given suspected reaction will treat with steroid injection in office.  Encouraged follow-up if no gradual improvement with any further concerns.  Final Clinical Impressions(s) / UC Diagnoses   Final diagnoses:  Itching   Discharge Instructions   None    ED Prescriptions   None    PDMP not reviewed this encounter.  Tomi Bamberger, PA-C 10/07/22 1344

## 2022-10-06 NOTE — Telephone Encounter (Signed)
Received fax from [facility] requesting refills for Rosuvastatin. Rx request sent to pharmacy.

## 2022-10-10 ENCOUNTER — Telehealth: Payer: Self-pay | Admitting: Neurology

## 2022-10-10 NOTE — Telephone Encounter (Signed)
Pt called and stated she would like to discuss symptoms she has been having. Pt states that she is also needing to discuss her memory loss and dizziness. Please advise.

## 2022-10-11 NOTE — Telephone Encounter (Signed)
I spoke with the patient.  She last saw Dr. Lucia Gaskins in April and had the same symptoms.  She had MRI brain and cervical spinal cord which did not show any acute abnormality.  She had an EEG which was also normal.  The patient states that she would like to follow-up with Dr. Lucia Gaskins to try to get some answers.  Symptoms are still present (tremors in hands, legs) and she reports her vertigo is worse.  She feels off balance, depth perception off with the room spinning and states its hard to explain. She states she has involuntary movement. She feels she may end up needing to see other specialists.  She states the symptoms worsened 2 weeks ago.  I asked her what happened to them and she said she had an allergic reaction with hives over multiple areas of her body. She saw urgent care and received a steroid shot.  She is unsure if there is any relation to her other symptoms.  The hives have improved.  The patient reports she uses her CPAP regularly.  I asked her if she had a follow-up with her primary care about her vertigo and dizziness and she states she had not seen them yet but she does have an appointment with them in October.  She would like to be scheduled with Dr. Lucia Gaskins for a follow-up.  She is amenable to seeing NP if appropriate.  I scheduled her with the next available appointment on December 19th at 2:00 pm with Dr Lucia Gaskins and pt was ok with that but did want to be placed on the wait list. I did advise the patient that if she has any sudden neurological changes or concerns for stroke-like symptoms (ex. Unilateral weakness, numbness, tingling, facial droop, slurred speech, visual changes, loss of vision, double vision, etc) to call 911 for eval for stroke or other emergency neurological issue. Pt verbalized understanding and appreciation.

## 2022-10-12 NOTE — Telephone Encounter (Signed)
Sounds like she was [facility] with waiting until first available with me. In the meantime your suggestions including to see pcp  and signs to look for were appropriate thank you

## 2022-10-13 NOTE — Telephone Encounter (Signed)
Noted thanks °

## 2022-12-05 ENCOUNTER — Other Ambulatory Visit: Payer: Self-pay | Admitting: Neurology

## 2022-12-05 DIAGNOSIS — G43711 Chronic migraine without aura, intractable, with status migrainosus: Secondary | ICD-10-CM

## 2022-12-08 ENCOUNTER — Telehealth: Payer: Self-pay

## 2022-12-08 ENCOUNTER — Other Ambulatory Visit: Payer: Self-pay | Admitting: *Deleted

## 2022-12-08 MED ORDER — AJOVY 225 MG/1.5ML ~~LOC~~ SOAJ: SUBCUTANEOUS | 1 refills | Status: DC

## 2022-12-08 NOTE — Telephone Encounter (Signed)
Patient called in needing refill of AJOVY prescription as soon as possible due to current migraine. Please call back at [phone].

## 2022-12-08 NOTE — Telephone Encounter (Signed)
Refill has been sent to pharmacy.  

## 2022-12-08 NOTE — Addendum Note (Signed)
Addended by: Guy Begin on: 12/08/2022 04:44 PM   Modules accepted: Orders

## 2023-01-19 ENCOUNTER — Encounter: Payer: Self-pay | Admitting: Neurology

## 2023-01-19 ENCOUNTER — Telehealth: Payer: Self-pay | Admitting: Neurology

## 2023-01-19 ENCOUNTER — Ambulatory Visit (INDEPENDENT_AMBULATORY_CARE_PROVIDER_SITE_OTHER): Payer: Medicare Other | Admitting: Neurology

## 2023-01-19 VITALS — BP 135/95 | HR 98 | Ht 67.0 in | Wt 189.0 lb

## 2023-01-19 DIAGNOSIS — G43711 Chronic migraine without aura, intractable, with status migrainosus: Secondary | ICD-10-CM

## 2023-01-19 MED ORDER — ONDANSETRON 4 MG PO TBDP: 4.0000 mg | ORAL_TABLET | Freq: Three times a day (TID) | ORAL | 0 refills | Status: AC | PRN

## 2023-01-19 MED ORDER — RIZATRIPTAN BENZOATE 10 MG PO TBDP: 10.0000 mg | ORAL_TABLET | ORAL | 11 refills | Status: DC | PRN
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
## 2023-01-19 NOTE — Patient Instructions (Addendum)
Preventative: Start Vyepti. When starting Vyepti can stop Ajovy. Then consider botox for migraines. Hopefully this will help with pain which will help with the twitches during pain. Also managing migraines may help with the vertigo and of course with the head pain. Botox could also be a great addition to vyepti for the neck pain and tightness. See you back in 3 months so that we can increase the vyept to 300 by video  When you have the migraine: Take Rizatriptan with the Ondansetron. Can repeat once in 2 hours. If this does not work we can consider Nurtec or Ubrelvy.   Eptinezumab Injection What is this medication? EPTINEZUMAB (EP ti NEZ ue mab) prevents migraines. It works by blocking a substance in the body that causes migraines. It is a monoclonal antibody. This medicine may be used for other purposes; ask your health care provider or pharmacist if you have questions. COMMON BRAND NAME(S): Vyepti What should I tell my care team before I take this medication? They need to know if you have any of these conditions: An unusual or allergic reaction to eptinezumab, other medications, foods, dyes, or preservatives Pregnant or trying to get pregnant Breast-feeding How should I use this medication? This medication is injected into a vein. It is given by your care team in a hospital or clinic setting. Talk to your care team about the use of this medication in children. Special care may be needed. Overdosage: If you think you have taken too much of this medicine contact a poison control center or emergency room at once. NOTE: This medicine is only for you. Do not share this medicine with others. What if I miss a dose? Keep appointments for follow-up doses. It is important not to miss your dose. Call your care team if you are unable to keep an appointment. What may interact with this medication? Interactions are not expected. This list may not describe all possible interactions. Give your health care  provider a list of all the medicines, herbs, non-prescription drugs, or dietary supplements you use. Also tell them if you smoke, drink alcohol, or use illegal drugs. Some items may interact with your medicine. What should I watch for while using this medication? Your condition will be monitored carefully while you are receiving this medication. What side effects may I notice from receiving this medication? Side effects that you should report to your care team as soon as possible: Allergic reactions or angioedema--skin rash, itching or hives, swelling of the face, eyes, lips, tongue, arms, or legs, trouble swallowing or breathing Side effects that usually do not require medical attention (report to your care team if they continue or are bothersome): Runny or stuffy nose This list may not describe all possible side effects. Call your doctor for medical advice about side effects. You may report side effects to FDA at 1-800-FDA-1088. Where should I keep my medication? This medication is given in a hospital or clinic. It will not be stored at home. NOTE: This sheet is a summary. It may not cover all possible information. If you have questions about this medicine, talk to your doctor, pharmacist, or health care provider.  2024 Elsevier/Gold Standard (2021-03-15 00:00:00) Ondansetron Dissolving Tablets What is this medication? ONDANSETRON (on DAN se tron) prevents nausea and vomiting from chemotherapy, radiation, or surgery. It works by blocking substances in the body that may cause nausea or vomiting. It belongs to a group of medications called antiemetics. This medicine may be used for other purposes; ask your health  care provider or pharmacist if you have questions. COMMON BRAND NAME(S): Zofran ODT What should I tell my care team before I take this medication? They need to know if you have any of these conditions: Heart disease Irregular heartbeat or rhythm Liver disease Low levels of magnesium  or potassium in the blood An unusual or allergic reaction to ondansetron, other medications, foods, dyes, or preservatives Pregnant or trying to get pregnant Breastfeeding How should I use this medication? Take this medication by mouth. Take it as directed on the prescription label at the same time every day. You do not need water to take this medication. Leave the tablet in the sealed pack until you are ready to take it. With dry hands, open the pack and gently remove the tablet. Place the tablet in the mouth and allow it to dissolve. Then, swallow it. Talk to your care team about the use of this medication in children. Special care may be needed. Overdosage: If you think you have taken too much of this medicine contact a poison control center or emergency room at once. NOTE: This medicine is only for you. Do not share this medicine with others. What if I miss a dose? If you miss a dose, take it as soon as you can. If it is almost time for your next dose, take only that dose. Do not take double or extra doses. What may interact with this medication? Do not take this medication with any of the following: Apomorphine Certain medications for fungal infections, such as fluconazole, ketoconazole, posaconazole Cisapride Dronedarone Levoketoconazole Pimozide Quinidine Thioridazine This medication may also interact with the following: Certain medications for depression, anxiety, or other mental health conditions Certain medications for migraines, such as sumatriptan Linezolid Methylene blue Opioids Other medications that cause heart rhythm changes, such as dofetilide or ziprasidone St. John's wort Stimulant medications for ADHD, weight loss, or staying awake Tryptophan This list may not describe all possible interactions. Give your health care provider a list of all the medicines, herbs, non-prescription drugs, or dietary supplements you use. Also tell them if you smoke, drink alcohol, or use  illegal drugs. Some items may interact with your medicine. What should I watch for while using this medication? Check with your care team as soon as you can if you have any sign of an allergic reaction. What side effects may I notice from receiving this medication? Side effects that you should report to your care team as soon as possible: Allergic reactions--skin rash, itching, hives, swelling of the face, lips, tongue, or throat Bowel blockage--stomach cramping, unable to have a bowel movement or pass gas, loss of appetite, vomiting Chest pain (angina)--pain, pressure, or tightness in the chest, neck, back, or arms Heart rhythm changes--fast or irregular heartbeat, dizziness, feeling faint or lightheaded, chest pain, trouble breathing Irritability, confusion, fast or irregular heartbeat, muscle stiffness, twitching muscles, sweating, high fever, seizure, chills, vomiting, diarrhea, which may be signs of serotonin syndrome Side effects that usually do not require medical attention (report to your care team if they continue or are bothersome): Constipation Diarrhea General discomfort and fatigue Headache This list may not describe all possible side effects. Call your doctor for medical advice about side effects. You may report side effects to FDA at 1-800-FDA-1088. Where should I keep my medication? Keep out of the reach of children and pets. Store between 2 and 30 degrees C (36 and 86 degrees F). Throw away any unused medication after the expiration date. NOTE: This sheet is  a summary. It may not cover all possible information. If you have questions about this medicine, talk to your doctor, pharmacist, or health care provider.  2024 Elsevier/Gold Standard (2022-03-23 00:00:00) Rizatriptan Tablets What is this medication? RIZATRIPTAN (rye za TRIP tan) treats migraines. It works by blocking pain signals and narrowing blood vessels in the brain. It belongs to a group of medications called  triptans. It is not used to prevent migraines. This medicine may be used for other purposes; ask your health care provider or pharmacist if you have questions. COMMON BRAND NAME(S): Maxalt What should I tell my care team before I take this medication? They need to know if you have any of these conditions: Circulation problems in fingers and toes Diabetes Heart disease High blood pressure High cholesterol History of irregular heartbeat History of stroke Stomach or intestine problems Tobacco use An unusual or allergic reaction to rizatriptan, other medications, foods, dyes, or preservatives Pregnant or trying to get pregnant Breast-feeding How should I use this medication? Take this medication by mouth with water. Take it as directed on the prescription label. Do not use it more often than directed. Talk to your care team about the use of this medication in children. While it may be prescribed for children as young as 6 years for selected conditions, precautions do apply. Overdosage: If you think you have taken too much of this medicine contact a poison control center or emergency room at once. NOTE: This medicine is only for you. Do not share this medicine with others. What if I miss a dose? This does not apply. This medication is not for regular use. What may interact with this medication? Do not take this medication with any of the following: Ergot alkaloids, such as dihydroergotamine, ergotamine MAOIs, such as Marplan, Nardil, Parnate Other medications for migraine headache, such as almotriptan, eletriptan, frovatriptan, naratriptan, sumatriptan, zolmitriptan This medication may also interact with the following: Certain medications for depression, anxiety, or other mental health conditions Propranolol This list may not describe all possible interactions. Give your health care provider a list of all the medicines, herbs, non-prescription drugs, or dietary supplements you use. Also  tell them if you smoke, drink alcohol, or use illegal drugs. Some items may interact with your medicine. What should I watch for while using this medication? Visit your care team for regular checks on your progress. Tell your care team if your symptoms do not start to get better or if they get worse. This medication may affect your coordination, reaction time, or judgment. Do not drive or operate machinery until you know how this medication affects you. Sit up or stand slowly to reduce the risk of dizzy or fainting spells. If you take migraine medications for 10 or more days a month, your migraines may get worse. Keep a diary of headache days and medication use. Contact your care team if your migraine attacks occur more frequently. What side effects may I notice from receiving this medication? Side effects that you should report to your care team as soon as possible: Allergic reactions--skin rash, itching, hives, swelling of the face, lips, tongue, or throat Burning, pain, tingling, or color changes in the hands, arms, legs, or feet Heart attack--pain or tightness in the chest, shoulders, arms, or jaw, nausea, shortness of breath, cold or clammy skin, feeling faint or lightheaded Heart rhythm changes--fast or irregular heartbeat, dizziness, feeling faint or lightheaded, chest pain, trouble breathing Increase in blood pressure Irritability, confusion, fast or irregular heartbeat, muscle stiffness, twitching  muscles, sweating, high fever, seizure, chills, vomiting, diarrhea, which may be signs of serotonin syndrome Raynaud syndrome--cool, numb, or painful fingers or toes that may change color from pale, to blue, to red Seizures Stroke--sudden numbness or weakness of the face, arm, or leg, trouble speaking, confusion, trouble walking, loss of balance or coordination, dizziness, severe headache, change in vision Sudden or severe stomach pain, bloody diarrhea, fever, nausea, vomiting Vision loss Side  effects that usually do not require medical attention (report to your care team if they continue or are bothersome): Dizziness Unusual weakness or fatigue This list may not describe all possible side effects. Call your doctor for medical advice about side effects. You may report side effects to FDA at 1-800-FDA-1088. Where should I keep my medication? Keep out of the reach of children and pets. Store at room temperature between 15 and 30 degrees C (59 and 86 degrees F). Get rid of any unused medication after the expiration date. To get rid of medications that are no longer needed or have expired: Take the medication to a medication take-back program. Check with your pharmacy or law enforcement to find a location. If you cannot return the medication, check the label or package insert to see if the medication should be thrown out in the garbage or flushed down the toilet. If you are not sure, ask your care team. If it is safe to put it in the trash, empty the medication out of the container. Mix the medication with cat litter, dirt, coffee grounds, or other unwanted substance. Seal the mixture in a bag or container. Put it in the trash. NOTE: This sheet is a summary. It may not cover all possible information. If you have questions about this medicine, talk to your doctor, pharmacist, or health care provider.  2024 Elsevier/Gold Standard (2021-05-20 00:00:00)

## 2023-01-19 NOTE — Telephone Encounter (Signed)
G43.711 please start Vyepti protocol. Can we start at 300 for medicare/medicaid?

## 2023-01-19 NOTE — Telephone Encounter (Signed)
Vyepti 100 mg order form completed and is pending Dr Trevor Mace signature. Per [place], [place] will require start at 100 mg.

## 2023-01-23 NOTE — Telephone Encounter (Signed)
Vyepti order signed by Dr Lucia Gaskins. Order given to [facility].

## 2023-03-02 ENCOUNTER — Other Ambulatory Visit: Payer: Self-pay | Admitting: Neurology

## 2023-03-02 ENCOUNTER — Encounter: Payer: Self-pay | Admitting: *Deleted

## 2023-03-02 DIAGNOSIS — G43711 Chronic migraine without aura, intractable, with status migrainosus: Secondary | ICD-10-CM

## 2023-03-02 NOTE — Telephone Encounter (Signed)
error

## 2023-03-02 NOTE — Telephone Encounter (Addendum)
We are trying to get Vyept for patient instead of Ajovy. Berkley Harvey is pending.

## 2023-03-02 NOTE — Telephone Encounter (Signed)
Spoke with Melissa [name] in [place]. Pt's medicaid plan doesn't cover Vyepti so the order is under specialty pharmacy review at this time.

## 2023-03-20 ENCOUNTER — Telehealth: Payer: Self-pay | Admitting: Neurology

## 2023-03-20 NOTE — Telephone Encounter (Signed)
Pt has called stating that DME is telling her they will not allow her to order her CPAP supplies until they have spoken with Dr Lucia Gaskins, please call DME

## 2023-03-20 NOTE — Telephone Encounter (Signed)
[facility]) Requesting neurologist evaluation from 12/02/2022. Would like a call back. Fax:  [phone]

## 2023-03-20 NOTE — Telephone Encounter (Signed)
[facility] has access to these notes. I sent a message  to their team.

## 2023-03-20 NOTE — Telephone Encounter (Signed)
I called and spoke to [facility], they are needing pts last note for her to be able to get supplies.  I relayed that the last note speaks to more of pt migraines with Dr. Lucia Gaskins.  (Who is in our practice as well).  Fax # [phone].  I spoke to pt and for her to get supplies they need her most recent note, but I relayed that it may be more with her migraines and this may not work for her getting supplies but we can try.  She may need appt with Dr. Vickey Huger or Aundra Millet NP for cpap.  (She was last seen 10-07-2021 about cpap).  Pt verbalized understanding.  Will need appt, next available with MM/NP is 06-14-2023 at 1130. Placed on waitlist as well.  Faxed last office note to [phone].

## 2023-03-29 ENCOUNTER — Other Ambulatory Visit: Payer: Self-pay | Admitting: Neurology

## 2023-03-29 ENCOUNTER — Telehealth: Payer: Self-pay | Admitting: Neurology

## 2023-03-29 ENCOUNTER — Ambulatory Visit
Admission: RE | Admit: 2023-03-29 | Discharge: 2023-03-29 | Disposition: A | Discharge status: Home / Self Care | Payer: Medicare Other | Source: Ambulatory Visit | Attending: Neurology

## 2023-03-29 ENCOUNTER — Encounter: Payer: Self-pay | Admitting: Neurology

## 2023-03-29 DIAGNOSIS — R4020 Unspecified coma: Secondary | ICD-10-CM

## 2023-03-29 DIAGNOSIS — S0990XA Unspecified injury of head, initial encounter: Secondary | ICD-10-CM

## 2023-03-29 DIAGNOSIS — R404 Transient alteration of awareness: Secondary | ICD-10-CM

## 2023-03-29 NOTE — Progress Notes (Incomplete)
Pt fell on 03/18/2023 Dizzy spells s/p fall

## 2023-03-29 NOTE — Telephone Encounter (Signed)
 no auth required patient will walk into [facility] today. [phone]

## 2023-04-11 ENCOUNTER — Other Ambulatory Visit: Payer: Self-pay | Admitting: Neurology

## 2023-04-11 ENCOUNTER — Telehealth: Payer: Self-pay | Admitting: Neurology

## 2023-04-11 DIAGNOSIS — R4020 Unspecified coma: Secondary | ICD-10-CM

## 2023-04-11 DIAGNOSIS — R569 Unspecified convulsions: Secondary | ICD-10-CM

## 2023-04-11 DIAGNOSIS — S0990XA Unspecified injury of head, initial encounter: Secondary | ICD-10-CM

## 2023-04-11 NOTE — Telephone Encounter (Signed)
 no auth required sent to [facility] [phone]

## 2023-04-13 ENCOUNTER — Encounter: Payer: Self-pay | Admitting: Neurology

## 2023-04-13 ENCOUNTER — Ambulatory Visit: Admitting: Neurology

## 2023-04-13 DIAGNOSIS — R4182 Altered mental status, unspecified: Secondary | ICD-10-CM | POA: Diagnosis not present

## 2023-04-13 DIAGNOSIS — R4020 Unspecified coma: Secondary | ICD-10-CM

## 2023-04-13 DIAGNOSIS — S0990XA Unspecified injury of head, initial encounter: Secondary | ICD-10-CM

## 2023-04-13 DIAGNOSIS — R404 Transient alteration of awareness: Secondary | ICD-10-CM

## 2023-04-13 NOTE — Procedures (Signed)
    History:  43 year old woman with transient alteration of awareness.   EEG classification: Awake and drowsy  Duration: 25 minutes   Technical aspects: This EEG study was done with scalp electrodes positioned according to the 10-20 International system of electrode placement. Electrical activity was reviewed with band pass filter of 1-70Hz , sensitivity of 7 uV/mm, display speed of 64mm/sec with a 60Hz  notched filter applied as appropriate. EEG data were recorded continuously and digitally stored.   Description of the recording: The background rhythms of this recording consists of a fairly well modulated medium amplitude alpha rhythm of 9 Hz that is reactive to eye opening and closure. Present in the anterior head region is a 15-20 Hz beta activity. Photic stimulation was performed, did not show any abnormalities. Hyperventilation was also performed, did not show any abnormalities. Drowsiness was manifested by background fragmentation. No abnormal epileptiform discharges seen during this recording. There was no focal slowing. There were no electrographic seizure identified.   Abnormality: None   Impression: This is a normal awake and drowsy EEG. No evidence of interictal epileptiform discharges. Normal EEGs, however, do not rule out epilepsy.    Windell Norfolk, MD [facility]

## 2023-04-24 ENCOUNTER — Telehealth: Payer: Self-pay | Admitting: Neurology

## 2023-04-24 ENCOUNTER — Telehealth (INDEPENDENT_AMBULATORY_CARE_PROVIDER_SITE_OTHER): Payer: Medicare Other | Admitting: Neurology

## 2023-04-24 DIAGNOSIS — R413 Other amnesia: Secondary | ICD-10-CM | POA: Diagnosis not present

## 2023-04-24 DIAGNOSIS — G4730 Sleep apnea, unspecified: Secondary | ICD-10-CM | POA: Diagnosis not present

## 2023-04-24 DIAGNOSIS — R404 Transient alteration of awareness: Secondary | ICD-10-CM | POA: Diagnosis not present

## 2023-04-24 DIAGNOSIS — R4020 Unspecified coma: Secondary | ICD-10-CM

## 2023-04-24 DIAGNOSIS — G43711 Chronic migraine without aura, intractable, with status migrainosus: Secondary | ICD-10-CM | POA: Diagnosis not present

## 2023-04-24 NOTE — Progress Notes (Signed)
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     language fluent;     normal attention, concentration, fund of knowledge Cranial Nerves:    The pupils are equal, round, and reactive to light. Visual fields are full Extraocular movements are intact.  The face is symmetric with normal sensation. The palate elevates in the midline. Hearing intact. Voice is normal. Shoulder shrug is normal. The tongue has normal motion without fasciculations.   Coordination: normal  Gait:    No abnormalities noted or reported  Motor Observation:   no involuntary movements noted. Tone:    Appears normal  Posture:    Posture is normal. normal erect    Strength:    Strength is anti-gravity and symmetric in the upper and lower limbs.      Sensation: intact to LT, no reports of numbness or tingling or paresthesias        Assessment/Plan:  43 year old with a new constellation of symptoms including right-sided freezing up to the point where she could not move her right side at all, happen multiple times, new tremor which is more postural but can be distracted, no loss of consciousness but mother recounts episodes that patient does not so we  probably need an EEG to look for seizures, she does not drive, now walking with a cane for 3 to 6 months, both legs are unstable getting worse, states she shaved her hair because her hair was falling out, weakness in the legs, blurred vision, pain in the back of the head, shooting pain down the arms and numbness in the hands and feet, difficulty with gait.  There were some functional aspects to her exam but she did show brisk uppers and slightly brisk patellas with a few beats clonus that could be normal but needs evaluation, weakness throughout unclear, recommend MRI of the brain and cervical spine with and without contrast to evaluate for demyelinating disease, strokes, seizure focus, other lesions and EEG for seizures  Current Plan 04/24/2023:  - have to evaluate her sleep report, says not sleeping and having apneic events on the cpap, hasn;t followed up since 2023, will bring in her chip to download and office to call for follow up with sleep tem - EEGs have been normal. F/u with primary care for syncopal events on standing, may need orthostatics? Just started on amlodipine. Doe snot happen when sitting only apon standing. Says she is goin gto GI, is following up with pcp soon and has been to cardiology.  - continue vyepti and see her back in June and try to get 300mg  for chronic migraines - cognitive complaints: CT of the head 03/29/2023: IMPRESSION:1. No CT evidence of intracranial injury.2. 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 She sates she has foggy brain I don;t think she has a neurodegenerative but likely chronic migraines, medications(ambien, lyrica, flexeril, tramadol daily), possibly mood.  She also may have untreated sleep apnea and that can also cause headaches and sleep apnea. Has not seen Megan since 2023 for sleep apnea and says cpap not working still havig apneic events.  She has to  bring in the chip, cannot get online, to see what is going on with cpap  PRIOR A/Ps:   She prefers to try Vyepti in place of Ajovy. We discussed botox and that can be next or layered on top of vyepti if needed. Still having 2-3 migraine days a week  even on the ajovy. Ajovy has helped >50% but still has 8-12 moderate to severe migraine days a month, 15 headache days a month, no medication overuse, no aura, pulsating/pounding/throbbing. Moderate to severe, left sided, photo/phonophobia, nausea, lasting 8-24 hours. She is doing leg lifts and PT.   Performed MRi brain and cervical spine and EEG. All unremarkable.   Preventative: Start Vyepti. When starting Vyepti can stop Ajovy. Then consider botox for migraines. Hopefully this will help with pain which will help with the twitches during pain. Also managing migraines may help with the vertigo and of course with the head pain. Botox could also be a great addition to vyepti for the neck pain and tightness. See you back in 3 months so that we can increase the vyept to 300 by video  When you have the migraine: Take Rizatriptan with the Ondansetron. Can repeat once in 2 hours. If this does not work we can consider Nurtec or Ubrelvy.   No orders of the defined types were placed in this encounter.    Cc: Salli Real, MD,  Salli Real, MD  Naomie Dean, MD  [facility] [address] [place], [place] [address]  Phone [phone] Fax [phone]

## 2023-04-24 NOTE — Telephone Encounter (Signed)
 Appt with Aundra Millet is on the wait list, LVM asking pt to cb and schedule June VV with Dr. Lucia Gaskins.

## 2023-04-24 NOTE — Telephone Encounter (Signed)
 Jillian/Angel Can she get a follow up with megan for sleep apnea please has to come in thanks not sure if Aundra Millet can see her sooner she states she is "still not breathing on the cpap"  and needed supplies bit hadn't followed up since 2023. Also follow up with me video beginning of June fo rmigraine follow up and see if we can increase vyepti to 300mg  if needed  Megan:  Megan I know you are bookig out pretty far,  if Fatiha can just bring in the machine can we get her download and see what is going on in the meantime? Thanks

## 2023-04-26 ENCOUNTER — Encounter: Payer: Self-pay | Admitting: Gastroenterology

## 2023-05-13 ENCOUNTER — Ambulatory Visit
Admission: RE | Admit: 2023-05-13 | Discharge: 2023-05-13 | Disposition: A | Discharge status: Home / Self Care | Source: Ambulatory Visit | Attending: Neurology | Admitting: Neurology

## 2023-05-13 DIAGNOSIS — R569 Unspecified convulsions: Secondary | ICD-10-CM | POA: Diagnosis not present

## 2023-05-13 DIAGNOSIS — R4020 Unspecified coma: Secondary | ICD-10-CM

## 2023-05-13 DIAGNOSIS — S0990XA Unspecified injury of head, initial encounter: Secondary | ICD-10-CM

## 2023-05-13 MED ORDER — GADOPICLENOL 0.5 MMOL/ML IV SOLN
7.5000 mL | Freq: Once | INTRAVENOUS | Status: AC | PRN
  Administered 2023-05-13: 7.5 mL via INTRAVENOUS

## 2023-05-15 ENCOUNTER — Encounter: Payer: Self-pay | Admitting: Neurology

## 2023-05-21 ENCOUNTER — Other Ambulatory Visit: Payer: Self-pay | Admitting: Physician Assistant

## 2023-06-13 NOTE — Progress Notes (Unsigned)
 Melissa [name]

## 2023-06-14 ENCOUNTER — Ambulatory Visit (INDEPENDENT_AMBULATORY_CARE_PROVIDER_SITE_OTHER): Payer: Medicare Other | Admitting: Adult Health

## 2023-06-14 ENCOUNTER — Encounter: Payer: Self-pay | Admitting: Adult Health

## 2023-06-14 VITALS — BP 123/86 | HR 98 | Ht 67.0 in | Wt 185.8 lb

## 2023-06-14 DIAGNOSIS — G4733 Obstructive sleep apnea (adult) (pediatric): Secondary | ICD-10-CM | POA: Diagnosis not present

## 2023-06-14 NOTE — Progress Notes (Deleted)
 PATIENT: Melissa [name] DOB: 1980/09/01  REASON FOR VISIT: follow up HISTORY FROM: patient PRIMARY NEUROLOGIST:   Chief Complaint  Patient presents with   Room 19    Pt is here Alone. Pt states that things have been fine with her CPAP. Pt states that she isn't getting much rest on her CPAP Machine. Pt states that she suffers from Insomnia.       HISTORY OF PRESENT ILLNESS: Today 06/14/23  HISTORY   REVIEW OF SYSTEMS: Out of a complete 14 system review of symptoms, the patient complains only of the following symptoms, and all other reviewed systems are negative.  FSS ESS  ALLERGIES: Allergies  Allergen Reactions   Tomato Anaphylaxis    Other Reaction(s): Not available   Aspirin     Other Reaction(s): Not available   Iodinated Contrast Media Hives   Latex Itching    Other Reaction(s): Not available   Other Itching    Hives Vitamin E Analogues   Penicillins    Vitamin E Hives    Other Reaction(s): Not available    HOME MEDICATIONS: Outpatient Medications Prior to Visit  Medication Sig Dispense Refill   albuterol  (PROVENTIL ) (2.5 MG/3ML) 0.083% nebulizer solution Take 3 mLs (2.5 mg total) by nebulization every 6 (six) hours as needed for wheezing or shortness of breath. 150 mL 1   albuterol  (VENTOLIN  HFA) 108 (90 Base) MCG/ACT inhaler TAKE 2 PUFFS BY MOUTH EVERY 6 HOURS AS NEEDED FOR WHEEZE OR SHORTNESS OF BREATH 8.5 each 3   Ascorbic Acid (VITAMIN C) 1000 MG tablet Take 1,000 mg by mouth daily.     CHLOROPHYLL PO Take by mouth.     cholecalciferol (VITAMIN D ) 1000 units tablet Take 1,000 Units by mouth daily.     COD LIVER OIL PO Take by mouth.     Cyanocobalamin (VITAMIN B-12 PO)      cyclobenzaprine  (FLEXERIL ) 10 MG tablet TAKE 1 TABLET BY MOUTH THREE TIMES A DAY AS NEEDED FOR MUSCLE SPASMS 90 tablet 0   dicyclomine  (BENTYL ) 20 MG tablet TAKE 1 TABLET BY MOUTH 4 TIMES DAILY - BEFORE MEALS AND AT BEDTIME. 120 tablet 0   escitalopram  (LEXAPRO ) 20 MG tablet  TAKE 1 TABLET BY MOUTH EVERY DAY 90 tablet 3   meclizine  (ANTIVERT ) 25 MG tablet TAKE 1 TABLET (25 MG TOTAL) BY MOUTH 2 (TWO) TIMES DAILY AS NEEDED FOR DIZZINESS. 30 tablet 5   Multiple Vitamin (MULTIVITAMIN) tablet Take 1 tablet by mouth daily.     Omega-3 Fatty Acids (FISH OIL) 1000 MG CAPS Take by mouth.     ondansetron  (ZOFRAN -ODT) 4 MG disintegrating tablet Take 1 tablet (4 mg total) by mouth every 8 (eight) hours as needed for nausea or vomiting. 20 tablet 0   pantoprazole  (PROTONIX ) 40 MG tablet TAKE 1 TABLET BY MOUTH EVERY DAY 90 tablet 3   pregabalin  (LYRICA ) 100 MG capsule Take 100 mg by mouth 3 (three) times daily.     promethazine  (PHENERGAN ) 12.5 MG tablet Take 1 tablet (12.5 mg total) by mouth every 8 (eight) hours as needed for nausea or vomiting. 30 tablet 0   Riboflavin (B-2-400 PO) Take by mouth.     rizatriptan  (MAXALT -MLT) 10 MG disintegrating tablet Take 1 tablet (10 mg total) by mouth as needed for migraine. May repeat in 2 hours if needed 12 tablet 11   rosuvastatin  (CRESTOR ) 20 MG tablet Take 1 tablet (20 mg total) by mouth daily. 90 tablet 3   traMADol (ULTRAM) 50  MG tablet Take by mouth every 6 (six) hours as needed.     zolpidem  (AMBIEN ) 10 MG tablet Take 1 tablet (10 mg total) by mouth at bedtime as needed for sleep. 30 tablet 0   No facility-administered medications prior to visit.    PAST MEDICAL HISTORY: Past Medical History:  Diagnosis Date   Asthma    Chest pain of uncertain etiology 06/08/2021   Elevated blood pressure reading 02/11/2022   Fibromyalgia    IBS (irritable bowel syndrome)    Migraine    Pure hypercholesterolemia 06/08/2021    PAST SURGICAL HISTORY: Past Surgical History:  Procedure Laterality Date   [facility]  2008    FAMILY HISTORY: Family History  Problem Relation Age of Onset   Heart disease Mother    Arthritis Mother    Diabetes Mother    High Cholesterol Mother    Migraines Mother    Cancer Father    High blood pressure Father     High Cholesterol Sister    High blood pressure Sister    Kidney disease Maternal Grandmother    Heart disease Maternal Grandfather    Cancer Maternal Grandfather    High Cholesterol Paternal Grandmother    High blood pressure Paternal Grandmother    High blood pressure Paternal Grandfather     SOCIAL HISTORY: Social History   Socioeconomic History   Marital status: Single    Spouse name: Not on file   Number of children: 0   Years of education: Not on file   Highest education level: Bachelor's degree (e.g., BA, AB, BS)  Occupational History   Occupation: Marine scientist: [employer]  Tobacco Use   Smoking status: Former    Types: Cigars    Quit date: 01/01/2018    Years since quitting: 5.4   Smokeless tobacco: Never   Tobacco comments:    Black and Milds  Vaping Use   Vaping status: Never Used  Substance and Sexual Activity   Alcohol use: Yes    Alcohol/week: 2.0 standard drinks of alcohol    Types: 2 Cans of beer per week   Drug use: No    Comment: uses CBD   Sexual activity: Not Currently  Other Topics Concern   Not on file  Social History Narrative   Patient is right-handed. She lives in a single level home. Her mother lives with her. She does not exercise.   Caffeine: Kombucha   Social Drivers of Corporate investment banker Strain: Low Risk  (06/08/2021)   Overall Financial Resource Strain (CARDIA)    Difficulty of Paying Living Expenses: Not hard at all  Food Insecurity: No Food Insecurity (06/08/2021)   Hunger Vital Sign    Worried About Running Out of Food in the Last Year: Never true    Ran Out of Food in the Last Year: Never true  Transportation Needs: No Transportation Needs (06/08/2021)   PRAPARE - Administrator, Civil Service (Medical): No    Lack of Transportation (Non-Medical): No  Physical Activity: Inactive (06/08/2021)   Exercise Vital Sign    Days of Exercise per Week: 0 days    Minutes of Exercise per Session: 0 min  Stress:  Not on file  Social Connections: Not on file  Intimate Partner Violence: Not on file      PHYSICAL EXAM  Vitals:   06/14/23 1128  BP: 123/86  Pulse: 98  Weight: 185 lb 12.8 oz (84.3 kg)  Height: 5'  7" (1.702 m)   Body mass index is 29.1 kg/m.  Generalized: Well developed, in no acute distress  Chest: Lungs clear to auscultation bilaterally  Neurological examination  Mentation: Alert oriented to time, place, history taking. Follows all commands speech and language fluent Cranial nerve II-XII: Extraocular movements were full, visual field were full on confrontational test Head turning and shoulder shrug  were normal and symmetric. Motor: The motor testing reveals 5 over 5 strength of all 4 extremities. Good symmetric motor tone is noted throughout.  Sensory: Sensory testing is intact to soft touch on all 4 extremities. No evidence of extinction is noted.  Gait and station: Gait is normal.    DIAGNOSTIC DATA (LABS, IMAGING, TESTING) - I reviewed patient records, labs, notes, testing and imaging myself where available.  Lab Results  Component Value Date   WBC 6.4 10/11/2021   HGB 13.7 10/11/2021   HCT 39.1 10/11/2021   MCV 84.4 10/11/2021   PLT 264 10/11/2021      Component Value Date/Time   NA 137 10/11/2021 2130   NA 139 07/28/2021 1039   K 4.4 10/11/2021 2130   CL 105 10/11/2021 2130   CO2 22 10/11/2021 2130   GLUCOSE 95 10/11/2021 2130   BUN 8 10/11/2021 2130   BUN 7 07/28/2021 1039   CREATININE 0.73 10/11/2021 2130   CREATININE 0.69 11/09/2018 1534   CALCIUM  9.7 10/11/2021 2130   PROT 8.2 (H) 10/11/2021 2130   PROT 7.5 07/28/2021 1039   ALBUMIN 4.3 10/11/2021 2130   ALBUMIN 4.6 07/28/2021 1039   AST 23 10/11/2021 2130   ALT 27 10/11/2021 2130   ALKPHOS 46 10/11/2021 2130   BILITOT 0.5 10/11/2021 2130   BILITOT 0.3 07/28/2021 1039   GFRNONAA >60 10/11/2021 2130   GFRAA <=90 11/18/2019 0000   Lab Results  Component Value Date   CHOL 136 07/28/2021    HDL 45 07/28/2021   LDLCALC 72 07/28/2021   LDLDIRECT 164.0 07/09/2020   TRIG 102 07/28/2021   CHOLHDL 3.0 07/28/2021   Lab Results  Component Value Date   HGBA1C 6.1 05/04/2021   Lab Results  Component Value Date   VITAMINB12 1,214 (H) 05/04/2021   Lab Results  Component Value Date   TSH 1.44 05/04/2021      ASSESSMENT AND PLAN 42 y.o. year old female  has a past medical history of Asthma, Chest pain of uncertain etiology (06/08/2021), Elevated blood pressure reading (02/11/2022), Fibromyalgia, IBS (irritable bowel syndrome), Migraine, and Pure hypercholesterolemia (06/08/2021). here with:  OSA on CPAP  - CPAP compliance excellent - Good treatment of AHI  - Encourage patient to use CPAP nightly and > 4 hours each night - F/U in 1 year or sooner if needed   I spent *** minutes of face-to-face and non-face-to-face time with patient.  This included previsit chart review, lab review, study review, order entry, electronic health record documentation, patient education.  Clem Currier, MSN, NP-C 06/14/2023, 11:31 AM [facility] [address] [place], [place] [address] [phone]

## 2023-06-14 NOTE — Patient Instructions (Signed)
 Continue using CPAP nightly and greater than 4 hours each night Discussed ongoing insomnia with your PCP If your symptoms worsen or you develop new symptoms please let us  know.

## 2023-06-20 ENCOUNTER — Encounter: Payer: Self-pay | Admitting: Gastroenterology

## 2023-06-20 ENCOUNTER — Ambulatory Visit (INDEPENDENT_AMBULATORY_CARE_PROVIDER_SITE_OTHER): Admitting: Gastroenterology

## 2023-06-20 VITALS — BP 118/70 | HR 96 | Ht 67.0 in | Wt 185.4 lb

## 2023-06-20 DIAGNOSIS — R140 Abdominal distension (gaseous): Secondary | ICD-10-CM

## 2023-06-20 DIAGNOSIS — R143 Flatulence: Secondary | ICD-10-CM | POA: Diagnosis not present

## 2023-06-20 DIAGNOSIS — K581 Irritable bowel syndrome with constipation: Secondary | ICD-10-CM | POA: Diagnosis not present

## 2023-06-20 NOTE — Progress Notes (Signed)
 Chief Complaint:IBS-C Primary GI Doctor: Dr. Venice Gillis  HPI:  Patient is a  43  year old A.A. female patient with past medical history of FB, asthma, IBS, migraine, who was referred to me by Narda Bacon, MD on 02/14/23 for a complaint of IBS-C. .   Interval History   Patient presents for evaluation for IBS constipation.  She is currently only taking dicyclomine  which she states doesn't help with the abdominal pain as of recent. She has 1-2 small bowel movements daily, but does not feel she empties out. No blood in stool. She experiences a lot of gas and bloat. She is following high fiber diet. She has tried Linzess which she states did not help relieve her stool burden. Unsure of the dose.  Nonsmoker. Socially drinks beer.    Patient taking Pantoprazole  40 mg po daily for GERD which she states controls her pyrosis, but she has a lot of belching.   Colonoscopy at [facility] in 2003 neg, 2007 neg, 2017 - 1 benign polyp. EGD in past- does not recall results  Patient's family history includes father with gastrointestinal stromal tumor at age 2.   Wt Readings from Last 3 Encounters:  06/20/23 185 lb 6.4 oz (84.1 kg)  06/14/23 185 lb 12.8 oz (84.3 kg)  01/19/23 189 lb (85.7 kg)    Past Medical History:  Diagnosis Date   Asthma    Chest pain of uncertain etiology 06/08/2021   Elevated blood pressure reading 02/11/2022   Fibromyalgia    IBS (irritable bowel syndrome)    Migraine    Pure hypercholesterolemia 06/08/2021   Past Surgical History:  Procedure Laterality Date   [facility]  2008   fluid removal     left lung   WISDOM TOOTH EXTRACTION     Current Outpatient Medications  Medication Sig Dispense Refill   albuterol  (PROVENTIL ) (2.5 MG/3ML) 0.083% nebulizer solution Take 3 mLs (2.5 mg total) by nebulization every 6 (six) hours as needed for wheezing or shortness of breath. 150 mL 1   albuterol  (VENTOLIN  HFA) 108 (90 Base) MCG/ACT inhaler TAKE 2 PUFFS BY MOUTH EVERY 6 HOURS  AS NEEDED FOR WHEEZE OR SHORTNESS OF BREATH 8.5 each 3   Ascorbic Acid (VITAMIN C) 1000 MG tablet Take 1,000 mg by mouth daily.     CHLOROPHYLL PO Take by mouth.     cholecalciferol (VITAMIN D ) 1000 units tablet Take 1,000 Units by mouth daily.     COD LIVER OIL PO Take by mouth.     Cyanocobalamin (VITAMIN B-12 PO)      cyclobenzaprine  (FLEXERIL ) 10 MG tablet TAKE 1 TABLET BY MOUTH THREE TIMES A DAY AS NEEDED FOR MUSCLE SPASMS 90 tablet 0   dicyclomine  (BENTYL ) 20 MG tablet TAKE 1 TABLET BY MOUTH 4 TIMES DAILY - BEFORE MEALS AND AT BEDTIME. 120 tablet 0   escitalopram  (LEXAPRO ) 20 MG tablet TAKE 1 TABLET BY MOUTH EVERY DAY 90 tablet 3   meclizine  (ANTIVERT ) 25 MG tablet TAKE 1 TABLET (25 MG TOTAL) BY MOUTH 2 (TWO) TIMES DAILY AS NEEDED FOR DIZZINESS. 30 tablet 5   Multiple Vitamin (MULTIVITAMIN) tablet Take 1 tablet by mouth daily.     Omega-3 Fatty Acids (FISH OIL) 1000 MG CAPS Take by mouth.     ondansetron  (ZOFRAN -ODT) 4 MG disintegrating tablet Take 1 tablet (4 mg total) by mouth every 8 (eight) hours as needed for nausea or vomiting. 20 tablet 0   pantoprazole  (PROTONIX ) 40 MG tablet TAKE 1 TABLET BY MOUTH  EVERY DAY 90 tablet 3   pregabalin  (LYRICA ) 100 MG capsule Take 100 mg by mouth 3 (three) times daily.     promethazine  (PHENERGAN ) 12.5 MG tablet Take 1 tablet (12.5 mg total) by mouth every 8 (eight) hours as needed for nausea or vomiting. 30 tablet 0   Riboflavin (B-2-400 PO) Take by mouth.     rizatriptan  (MAXALT -MLT) 10 MG disintegrating tablet Take 1 tablet (10 mg total) by mouth as needed for migraine. Sharna Gabrys repeat in 2 hours if needed 12 tablet 11   rosuvastatin  (CRESTOR ) 20 MG tablet Take 1 tablet (20 mg total) by mouth daily. 90 tablet 3   traMADol (ULTRAM) 50 MG tablet Take by mouth every 6 (six) hours as needed.     VYEPTI 100 MG/ML injection INFUSE VYEPTI 100MG  IN 100 ML NORMAL SALINE INTRAVENOUSLY OVER 30 MINUTES EVERY 3 MONTHS **STORE IN FRIDGE PRIOR TO ADMINISTRATION**      zolpidem  (AMBIEN ) 10 MG tablet Take 1 tablet (10 mg total) by mouth at bedtime as needed for sleep. 30 tablet 0   No current facility-administered medications for this visit.    Allergies as of 06/20/2023 - Review Complete 06/20/2023  Allergen Reaction Noted   Aspirin Shortness Of Breath November 21, 1980   Tomato Anaphylaxis 11/18/2019   Iodinated contrast media Hives 06/08/2021   Latex Itching 06/30/2014   Other Itching 11/18/2019   Penicillins  11/24/2017   Vitamin e Hives 04/14/2009    Family History  Problem Relation Age of Onset   Heart disease Mother    Arthritis Mother    Diabetes Mother    High Cholesterol Mother    Migraines Mother    Colon polyps Mother    Cancer Father        GIST tumor   High blood pressure Father    High Cholesterol Sister    High blood pressure Sister    Kidney disease Maternal Grandmother    Heart disease Maternal Grandfather    Cancer Maternal Grandfather    High Cholesterol Paternal Grandmother    High blood pressure Paternal Grandmother    High blood pressure Paternal Grandfather     Review of Systems:    Constitutional: No weight loss, fever, chills, weakness or fatigue HEENT: Eyes: No change in vision               Ears, Nose, Throat:  No change in hearing or congestion Skin: No rash or itching Cardiovascular: No chest pain, chest pressure or palpitations   Respiratory: No SOB or cough Gastrointestinal: See HPI and otherwise negative Genitourinary: No dysuria or change in urinary frequency Neurological: No headache, dizziness or syncope Musculoskeletal: No new muscle or joint pain Hematologic: No bleeding or bruising Psychiatric: No history of depression or anxiety    Physical Exam:  Vital signs: BP 118/70   Pulse 96   Ht 5\' 7"  (1.702 m)   Wt 185 lb 6.4 oz (84.1 kg)   LMP 05/21/2023 (Approximate)   BMI 29.04 kg/m   Constitutional:   Pleasant  female appears to be in NAD, Well developed, Well nourished, alert and  cooperative Throat: Oral cavity and pharynx without inflammation, swelling or lesion.  Respiratory: Respirations even and unlabored. Lungs clear to auscultation bilaterally.   No wheezes, crackles, or rhonchi.  Cardiovascular: Normal S1, S2. Regular rate and rhythm. No peripheral edema, cyanosis or pallor.  Gastrointestinal:  Soft, nondistended, generalized abd tenderness with palpation. No rebound or guarding. Hypoactive bowel sounds. No appreciable masses or hepatomegaly. Rectal:  Not performed.  Msk:  Symmetrical without gross deformities. Without edema, no deformity or joint abnormality.  Neurologic:  Alert and  oriented x4;  grossly normal neurologically.  Skin:   Dry and intact without significant lesions or rashes. Psychiatric: Oriented to person, place and time. Demonstrates good judgement and reason without abnormal affect or behaviors.  RELEVANT LABS AND IMAGING: CBC    Latest Ref Rng & Units 10/11/2021    9:30 PM 05/04/2021   12:19 PM 07/09/2020   12:01 PM  CBC  WBC 4.0 - 10.5 K/uL 6.4  4.6  5.8   Hemoglobin 12.0 - 15.0 g/dL 16.1  09.6  04.5   Hematocrit 36.0 - 46.0 % 39.1  37.5  40.3   Platelets 150 - 400 K/uL 264  229.0  173.0      CMP     Latest Ref Rng & Units 10/11/2021    9:30 PM 07/28/2021   10:39 AM 06/08/2021   10:27 AM  CMP  Glucose 70 - 99 mg/dL 95  409  811   BUN 6 - 20 mg/dL 8  7  5    Creatinine 0.44 - 1.00 mg/dL 9.14  7.82  9.56   Sodium 135 - 145 mmol/L 137  139  136   Potassium 3.5 - 5.1 mmol/L 4.4  4.1  4.6   Chloride 98 - 111 mmol/L 105  102  102   CO2 22 - 32 mmol/L 22  20  19    Calcium  8.9 - 10.3 mg/dL 9.7  9.5  9.6   Total Protein 6.5 - 8.1 g/dL 8.2  7.5  7.9   Total Bilirubin 0.3 - 1.2 mg/dL 0.5  0.3  0.3   Alkaline Phos 38 - 126 U/L 46  49  51   AST 15 - 41 U/L 23  20  18    ALT 0 - 44 U/L 27  21  18       Lab Results  Component Value Date   TSH 1.44 05/04/2021   10/12/21 CT ABD/pelvis IMPRESSION: No acute findings in the abdomen or  pelvis.  Assessment: Encounter Diagnoses  Name Primary?   Irritable bowel syndrome with constipation Yes   Flatulence    Bloating      43 year old A.A. female patient with main complaint of IBS-C. She is currently only taking dicyclomine  for abdominal pain. She states she has trialed Linzess and ineffective, she is not interested in increasing the dose she would like to try something different. Will give samples of Ibsrela 50 mg po twice daily. Recommended Low fodmap diet for gas and bloat. If no improvement will consider pro motility agent Motegrity. UTD on colon screening colonoscopy, 1 benign polyp in 2017.   Plan: -Samples of Ibsrela 50 mg twice daily provided  -low fodmap diet  Thank you for the courtesy of this consult. Please call me with any questions or concerns.   Riyah Bardon, FNP-C [facility] 06/20/2023, 11:30 AM  Cc: Narda Bacon, MD

## 2023-06-20 NOTE — Patient Instructions (Addendum)
 Samples of Ibsrela 50mg  1 tablet immediately before meals twice daily, if it works well let us  know and we will send prescription Recommend Low fodmap diet  _______________________________________________________  If your blood pressure at your visit was 140/90 or greater, please contact your primary care physician to follow up on this.  _______________________________________________________  If you are age 45 or older, your body mass index should be between 23-30. Your Body mass index is 29.04 kg/m. If this is out of the aforementioned range listed, please consider follow up with your Primary Care Provider.  If you are age 26 or younger, your body mass index should be between 19-25. Your Body mass index is 29.04 kg/m. If this is out of the aformentioned range listed, please consider follow up with your Primary Care Provider.   ________________________________________________________  The [facility] providers would like to encourage you to use MYCHART to communicate with providers for non-urgent requests or questions.  Due to long hold times on the telephone, sending your provider a message by [facility] may be a faster and more efficient way to get a response.  Please allow 48 business hours for a response.  Please remember that this is for non-urgent requests.  _______________________________________________________ Thank you for trusting me with your gastrointestinal care. Deanna May, RNP

## 2023-06-24 NOTE — Progress Notes (Signed)
 If polyp- TA, then due for colon. If hyperplastic, then 2027 Can we pl get path  Agree with assessment/plan.  Magnus Schuller, MD [facility] [phone]

## 2023-06-27 ENCOUNTER — Telehealth: Payer: Self-pay | Admitting: Gastroenterology

## 2023-06-27 NOTE — Telephone Encounter (Signed)
 PT is calling to have a refill for Melissa [name] sent to [facility] on [address]. Please advise.

## 2023-06-28 ENCOUNTER — Other Ambulatory Visit: Payer: Self-pay

## 2023-06-28 MED ORDER — IBSRELA 50 MG PO TABS: 1.0000 | ORAL_TABLET | Freq: Two times a day (BID) | ORAL | 1 refills | Status: DC

## 2023-06-28 NOTE — Progress Notes (Signed)
 Medication refill

## 2023-07-03 ENCOUNTER — Telehealth: Payer: Self-pay

## 2023-07-03 NOTE — Telephone Encounter (Signed)
 Medication refill

## 2023-07-03 NOTE — Telephone Encounter (Signed)
 Inbound call from patient stating IBSRELA  medication has a co pay of $911.00. Requesting a alternative medication to be sent in. Requesting a call back. Please advise, thank you.

## 2023-07-04 ENCOUNTER — Telehealth (INDEPENDENT_AMBULATORY_CARE_PROVIDER_SITE_OTHER): Admitting: Neurology

## 2023-07-04 DIAGNOSIS — G43711 Chronic migraine without aura, intractable, with status migrainosus: Secondary | ICD-10-CM

## 2023-07-04 DIAGNOSIS — G4734 Idiopathic sleep related nonobstructive alveolar hypoventilation: Secondary | ICD-10-CM

## 2023-07-04 NOTE — Progress Notes (Unsigned)
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 tablet TAKE 1 TABLET (25 MG TOTAL) BY MOUTH 2 (TWO) TIMES DAILY AS NEEDED FOR DIZZINESS. 30 tablet 5   Multiple Vitamin (MULTIVITAMIN) tablet Take 1 tablet by mouth daily.  Omega-3 Fatty Acids (FISH OIL) 1000 MG CAPS Take by mouth.     ondansetron  (ZOFRAN -ODT) 4 MG disintegrating tablet Take 1 tablet (4 mg total) by mouth every 8 (eight) hours as needed for nausea or vomiting. 20 tablet 0   pantoprazole  (PROTONIX ) 40 MG tablet TAKE 1 TABLET BY MOUTH EVERY DAY 90 tablet 3   pregabalin  (LYRICA ) 100 MG capsule Take 100 mg by mouth 3 (three) times daily.     promethazine  (PHENERGAN ) 12.5 MG tablet Take 1 tablet (12.5 mg total) by mouth every 8 (eight) hours as needed for nausea or vomiting. 30 tablet 0   Riboflavin (B-2-400 PO) Take by mouth.     rizatriptan  (MAXALT -MLT) 10 MG disintegrating tablet Take 1 tablet (10 mg total) by mouth as needed for migraine. May repeat in 2 hours if needed 12 tablet 11   rosuvastatin  (CRESTOR ) 20 MG tablet Take 1 tablet (20 mg total) by mouth daily. 90 tablet 3   Tenapanor HCl (IBSRELA ) 50 MG TABS Take 1 tablet by mouth in the morning and at bedtime. 30 tablet 1   traMADol (ULTRAM) 50 MG tablet Take by  mouth every 6 (six) hours as needed.     zolpidem  (AMBIEN ) 10 MG tablet Take 1 tablet (10 mg total) by mouth at bedtime as needed for sleep. 30 tablet 0   No current facility-administered medications for this visit.    Allergies as of 07/04/2023 - Review Complete 06/20/2023  Allergen Reaction Noted   Aspirin Shortness Of Breath 05/02/1978   Tomato Anaphylaxis 11/18/2019   Iodinated contrast media Hives 06/08/2021   Latex Itching 06/30/2014   Other Itching 11/18/2019   Penicillins  11/24/2017   Vitamin e Hives 04/14/2009    Vitals: LMP 05/21/2023 (Approximate)  Last Weight:  Wt Readings from Last 1 Encounters:  06/20/23 185 lb 6.4 oz (84.1 kg)   Last Height:   Ht Readings from Last 1 Encounters:  06/20/23 5\' 7"  (1.702 m)     Physical exam: Exam: Gen: NAD, conversant      CV: No palpitations or chest pain or SOB. VS: Breathing at a normal rate. Weight appears overweight. Not febrile. Eyes: Conjunctivae clear without exudates or hemorrhage  Neuro: Detailed Neurologic Exam  Speech:    Speech is normal; fluent and spontaneous with normal comprehension.  Cognition:    The patient is oriented to person, place, and time;     recent and remote memory intact;     language fluent;     normal attention, concentration, fund of knowledge Cranial Nerves:    The pupils are equal, round, and reactive to light. Visual fields are full Extraocular movements are intact.  The face is symmetric with normal sensation. The palate elevates in the midline. Hearing intact. Voice is normal. Shoulder shrug is normal. The tongue has normal motion without fasciculations.   Coordination: normal  Gait:    No abnormalities noted or reported  Motor Observation:   no involuntary movements noted. Tone:    Appears normal  Posture:    Posture is normal. normal erect    Strength:    Strength is anti-gravity and symmetric in the upper and lower limbs.      Sensation: intact to LT, no reports  of numbness or tingling or paresthesias             Assessment/Plan:  43 year old with a new constellation of symptoms including right-sided freezing up to the point where she could not move her right side  at all, happen multiple times, new tremor which is more postural but can be distracted, no loss of consciousness but mother recounts episodes that patient does not so we probably need an EEG to look for seizures, she does not drive, now walking with a cane for 3 to 6 months, both legs are unstable getting worse, states she shaved her hair because her hair was falling out, weakness in the legs, blurred vision, pain in the back of the head, shooting pain down the arms and numbness in the hands and feet, difficulty with gait.  There were some functional aspects to her exam but she did show brisk uppers and slightly brisk patellas with a few beats clonus that could be normal but needs evaluation, weakness throughout unclear, recommend MRI of the brain and cervical spine with and without contrast to evaluate for demyelinating disease, strokes, seizure focus, other lesions and EEG for seizures  Current Plan :  - says not sleeping and having apneic events on the cpap, hasn;t followed up since 2023, will bring in her chip to download and office to call for follow up with sleep team, has not done that - EEGs have been normal. F/u with primary care for syncopal events on standing, may need orthostatics? Just started on amlodipine. Doe snot happen when sitting only apon standing. Says she is goin gto GI, is following up with pcp soon and has been to cardiology.  - increase vyepti 300mg  - cognitive complaints: CT of the head 03/29/2023: IMPRESSION:1. No CT evidence of intracranial injury.2. 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 She sates she has foggy brain I don;t think she has a neurodegenerative but  likely chronic migraines, medications(ambien , lyrica , flexeril , tramadol daily), possibly mood.  She also may have untreated sleep apnea and that can also cause headaches and sleep apnea. Has not seen Megan since 2023 for sleep apnea and says cpap not working still havig apneic events.  She has to bring in the chip, cannot get online, to see what is going on with cpap - Tried multiple medications for her migraines. Now on Vyepti and not improving. May not be able to help if she does not f/u for sleep apnea, die reach out to Linde Reveal at [facility] to see if we could find CBT or a therapist  Meds ordered this encounter  Medications   sodium chloride  0.9 % SOLN 100 mL with Eptinezumab-jjmr 100 MG/ML SOLN 100 mg    Sig: Inject 100 mg into the vein every 3 (three) months.    Dispense:  300 mg    Refill:  4      Cc: Narda Bacon, MD,  Narda Bacon, MD  Aldona Amel, MD  [facility] [address] [place], [place] [address]  Phone [phone] Fax [phone]

## 2023-07-04 NOTE — Patient Instructions (Signed)
 Increase vyepti to 300 Continue to use cpap Talk to megan about belsomra Talk to lisa poulos about therapist for sleep

## 2023-07-04 NOTE — Telephone Encounter (Signed)
 Inbound call from patient requesting a call to discuss a generic version of IBS medication she was advised of by pharmacist. Please advise, thank you

## 2023-07-06 ENCOUNTER — Other Ambulatory Visit: Payer: Self-pay | Admitting: Gastroenterology

## 2023-07-06 ENCOUNTER — Encounter: Payer: Self-pay | Admitting: Neurology

## 2023-07-06 DIAGNOSIS — K581 Irritable bowel syndrome with constipation: Secondary | ICD-10-CM

## 2023-07-06 MED ORDER — LUBIPROSTONE 8 MCG PO CAPS
8.0000 ug | ORAL_CAPSULE | Freq: Two times a day (BID) | ORAL | 2 refills | Status: DC
Start: 2023-07-06 — End: 2023-07-07

## 2023-07-06 MED ORDER — SODIUM CHLORIDE 0.9 % IV SOLN: 100.0000 mg | INTRAVENOUS | 4 refills | Status: AC

## 2023-07-06 NOTE — Progress Notes (Signed)
 Patient requesting amitiza sent for IBS-C. Ibsrela  not covered

## 2023-07-10 ENCOUNTER — Telehealth: Payer: Self-pay | Admitting: *Deleted

## 2023-07-10 NOTE — Telephone Encounter (Signed)
 Per Dr. Tresia Fruit request increased vyepti dosage to 300mg  . Dr Tresia Fruit signed order and given to [facility] in infusion

## 2023-07-10 NOTE — Telephone Encounter (Signed)
-----   Message from Glory Larsen sent at 07/06/2023  7:08 PM EDT ----- Regarding: vyepti 300 Can we increase to vyepti 300mg  please? See my last note from this week thank you! I also printed out prescription in pod4

## 2023-07-17 NOTE — Telephone Encounter (Signed)
Inbound call from patient in regards to previous note. Please advise.  Thank you

## 2023-07-28 NOTE — Telephone Encounter (Signed)
 See telephone contact from 07/03/23

## 2023-08-07 ENCOUNTER — Other Ambulatory Visit: Payer: Self-pay | Admitting: Family Medicine

## 2023-08-07 DIAGNOSIS — Z1231 Encounter for screening mammogram for malignant neoplasm of breast: Secondary | ICD-10-CM

## 2023-08-29 ENCOUNTER — Ambulatory Visit
Admission: RE | Admit: 2023-08-29 | Discharge: 2023-08-29 | Disposition: A | Discharge status: Home / Self Care | Source: Ambulatory Visit | Attending: Family Medicine | Admitting: Family Medicine

## 2023-08-29 DIAGNOSIS — Z1231 Encounter for screening mammogram for malignant neoplasm of breast: Secondary | ICD-10-CM

## 2023-10-24 ENCOUNTER — Other Ambulatory Visit (HOSPITAL_BASED_OUTPATIENT_CLINIC_OR_DEPARTMENT_OTHER): Payer: Self-pay | Admitting: Cardiovascular Disease

## 2023-11-13 ENCOUNTER — Telehealth: Payer: Self-pay | Admitting: Neurology

## 2023-11-13 NOTE — Telephone Encounter (Signed)
 LVM and sent mychart msg informing pt of need to schedule an appointment to discuss Vyepti infusions   If patient calls back I have a slot held for her with Dr. Margaret for 11/16/23 at 3pm

## 2023-11-13 NOTE — Telephone Encounter (Signed)
 Patient called to schedule appointment with Dr.Penumalli

## 2023-11-16 ENCOUNTER — Encounter: Payer: Self-pay | Admitting: Diagnostic Neuroimaging

## 2023-11-16 ENCOUNTER — Ambulatory Visit: Admitting: Diagnostic Neuroimaging

## 2023-11-16 VITALS — BP 121/84 | HR 84 | Ht 67.0 in | Wt 177.0 lb

## 2023-11-16 DIAGNOSIS — G43109 Migraine with aura, not intractable, without status migrainosus: Secondary | ICD-10-CM | POA: Diagnosis not present

## 2023-11-16 MED ORDER — TOPIRAMATE 50 MG PO TABS: 50.0000 mg | ORAL_TABLET | Freq: Two times a day (BID) | ORAL | 12 refills | Status: AC

## 2023-11-16 MED ORDER — NURTEC 75 MG PO TBDP: 75.0000 mg | ORAL_TABLET | Freq: Every day | ORAL | 6 refills | Status: AC | PRN

## 2023-11-16 NOTE — Patient Instructions (Signed)
  MIGRAINE PREVENTION  LIFESTYLE CHANGES -Stop or avoid smoking -Decrease or avoid caffeine / alcohol -Eat and sleep on a regular schedule -Exercise several times per week - continue vyepti 300mg  IV every 3 months (since ~April 2025) - start topiramate 50mg  at bedtime; after 1-2 weeks increase to 50mg  twice a day; drink plenty of water   MIGRAINE RESCUE  - ibuprofen , tylenol  as needed - continue rizatriptan  (Maxalt ) 10mg  as needed for breakthrough headache; may repeat x 1 after 2 hours; max 2 tabs per day or 8 per month - start rimegepant (Nurtec) 75mg  as needed for breakthrough headache; max 8 per month

## 2023-11-16 NOTE — Progress Notes (Signed)
 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  History of headaches since 2012 with pain in the occipital and vertex region associate with nausea, sensitive to light and sound.  Sometimes sees black dots and spots.  Was having quite frequent headaches, now averaging 10 to 15 days/month.  She tried a variety of medications, currently on Vyepti infusions and rizatriptan .  Family history of migraine in her mother.    REVIEW OF SYSTEMS: Full 14 system review of systems performed and negative with exception of: As per HPI.  ALLERGIES: Allergies  Allergen Reactions   Aspirin Shortness Of Breath    Other Reaction(s): Not available   Tomato Anaphylaxis    Other Reaction(s): Not available   Iodinated Contrast Media Hives   Latex Itching    Other Reaction(s): Not available   Other Itching    Hives Vitamin E Analogues   Penicillins    Vitamin E Hives    Other Reaction(s): Not available    HOME MEDICATIONS: Outpatient Medications Prior to Visit  Medication Sig Dispense Refill   albuterol  (PROVENTIL ) (2.5 MG/3ML) 0.083% nebulizer solution Take 3 mLs (2.5 mg total) by nebulization every 6 (six) hours as needed for wheezing or shortness of breath. 150 mL 1   albuterol  (VENTOLIN  HFA) 108 (90 Base) MCG/ACT inhaler TAKE 2 PUFFS BY MOUTH EVERY 6 HOURS AS NEEDED FOR WHEEZE OR SHORTNESS OF BREATH 8.5 each 3   Ascorbic Acid (VITAMIN C) 1000 MG tablet Take 1,000 mg by mouth daily.     CHLOROPHYLL PO Take by mouth.     cholecalciferol (VITAMIN D ) 1000 units tablet  Take 1,000 Units by mouth daily.     COD LIVER OIL PO Take by mouth.     Cyanocobalamin (VITAMIN B-12 PO)      cyclobenzaprine  (FLEXERIL ) 10 MG tablet TAKE 1 TABLET BY MOUTH THREE TIMES A DAY AS NEEDED FOR MUSCLE SPASMS 90 tablet 0   dicyclomine  (BENTYL ) 20 MG tablet TAKE 1 TABLET BY MOUTH 4 TIMES DAILY - BEFORE MEALS AND AT BEDTIME. 120 tablet 0   Eptinezumab-jjmr (VYEPTI IV) Inject 300 mg into the vein every 3 (three) months.     escitalopram  (LEXAPRO ) 20 MG tablet TAKE 1 TABLET BY MOUTH EVERY DAY 90 tablet 3   lubiprostone  (AMITIZA ) 8 MCG capsule TAKE 1 CAPSULE (8 MCG TOTAL) BY MOUTH 2 (TWO) TIMES DAILY WITH A MEAL. 60 capsule 2   meclizine  (ANTIVERT ) 25 MG tablet TAKE 1 TABLET (25 MG TOTAL) BY MOUTH 2 (TWO) TIMES DAILY AS NEEDED FOR DIZZINESS. 30 tablet 5   Multiple Vitamin (MULTIVITAMIN) tablet Take 1 tablet by mouth daily.     Omega-3 Fatty Acids (FISH OIL) 1000 MG CAPS Take by mouth.     ondansetron  (ZOFRAN -ODT) 4 MG disintegrating tablet Take 1 tablet (4 mg total) by mouth every 8 (eight) hours as needed for nausea or vomiting. 20 tablet 0   pantoprazole  (PROTONIX ) 40 MG tablet TAKE 1 TABLET BY MOUTH EVERY DAY 90 tablet 3   pregabalin  (  LYRICA ) 100 MG capsule Take 100 mg by mouth 3 (three) times daily.     promethazine  (PHENERGAN ) 12.5 MG tablet Take 1 tablet (12.5 mg total) by mouth every 8 (eight) hours as needed for nausea or vomiting. 30 tablet 0   Riboflavin (B-2-400 PO) Take by mouth.     rizatriptan  (MAXALT -MLT) 10 MG disintegrating tablet Take 1 tablet (10 mg total) by mouth as needed for migraine. May repeat in 2 hours if needed 12 tablet 11   rosuvastatin  (CRESTOR ) 20 MG tablet TAKE 1 TABLET BY MOUTH EVERY DAY 90 tablet 0   sodium chloride  0.9 % SOLN 100 mL with Eptinezumab-jjmr 100 MG/ML SOLN 100 mg Inject 100 mg into the vein every 3 (three) months. 300 mg 4   traMADol (ULTRAM) 50 MG tablet Take by mouth every 6 (six) hours as needed.     zolpidem  (AMBIEN ) 10 MG tablet Take 1  tablet (10 mg total) by mouth at bedtime as needed for sleep. 30 tablet 0   Tenapanor HCl (IBSRELA ) 50 MG TABS Take 1 tablet by mouth in the morning and at bedtime. 30 tablet 1   No facility-administered medications prior to visit.    PAST MEDICAL HISTORY: Past Medical History:  Diagnosis Date   Asthma    Chest pain of uncertain etiology 06/08/2021   Elevated blood pressure reading 02/11/2022   Fibromyalgia    IBS (irritable bowel syndrome)    Migraine    Pure hypercholesterolemia 06/08/2021    PAST SURGICAL HISTORY: Past Surgical History:  Procedure Laterality Date   [facility]  2008   fluid removal     left lung   WISDOM TOOTH EXTRACTION      FAMILY HISTORY: Family History  Problem Relation Age of Onset   Heart disease Mother    Arthritis Mother    Diabetes Mother    High Cholesterol Mother    Migraines Mother    Colon polyps Mother    Cancer Father        GIST tumor   High blood pressure Father    High Cholesterol Sister    High blood pressure Sister    Breast cancer Maternal Aunt    Kidney disease Maternal Grandmother    Heart disease Maternal Grandfather    Cancer Maternal Grandfather    High Cholesterol Paternal Grandmother    High blood pressure Paternal Grandmother    High blood pressure Paternal Grandfather     SOCIAL HISTORY: Social History   Socioeconomic History   Marital status: Single    Spouse name: Not on file   Number of children: 0   Years of education: Not on file   Highest education level: Bachelor's degree (e.g., BA, AB, BS)  Occupational History   Occupation: Marine scientist: [employer]  Tobacco Use   Smoking status: Former    Types: Cigars    Quit date: 01/01/2018    Years since quitting: 5.8   Smokeless tobacco: Never   Tobacco comments:    Black and Milds  Vaping Use   Vaping status: Never Used  Substance and Sexual Activity   Alcohol use: Not Currently    Comment: 1 beer qod   Drug use: Yes    Types: Marijuana     Comment: uses CBD   Sexual activity: Not Currently  Other Topics Concern   Not on file  Social History Narrative   Patient is right-handed. She lives in a single level home. Her mother lives with her.  She does not exercise.   Caffeine: Kombucha   Social Drivers of Corporate investment banker Strain: Low Risk  (06/08/2021)   Overall Financial Resource Strain (CARDIA)    Difficulty of Paying Living Expenses: Not hard at all  Food Insecurity: No Food Insecurity (06/08/2021)   Hunger Vital Sign    Worried About Running Out of Food in the Last Year: Never true    Ran Out of Food in the Last Year: Never true  Transportation Needs: No Transportation Needs (06/08/2021)   PRAPARE - Administrator, Civil Service (Medical): No    Lack of Transportation (Non-Medical): No  Physical Activity: Inactive (06/08/2021)   Exercise Vital Sign    Days of Exercise per Week: 0 days    Minutes of Exercise per Session: 0 min  Stress: Not on file  Social Connections: Not on file  Intimate Partner Violence: Not on file     PHYSICAL EXAM  GENERAL EXAM/CONSTITUTIONAL: Vitals:  Vitals:   11/16/23 1448  BP: 121/84  Pulse: 84  Weight: 177 lb (80.3 kg)  Height: 5' 7 (1.702 m)   Body mass index is 27.72 kg/m. Wt Readings from Last 3 Encounters:  11/16/23 177 lb (80.3 kg)  06/20/23 185 lb 6.4 oz (84.1 kg)  06/14/23 185 lb 12.8 oz (84.3 kg)   Patient is in no distress; well developed, nourished and groomed; neck is supple  CARDIOVASCULAR: Examination of carotid arteries is normal; no carotid bruits Regular rate and rhythm, no murmurs Examination of peripheral vascular system by observation and palpation is normal  EYES: Ophthalmoscopic exam of optic discs and posterior segments is normal; no papilledema or hemorrhages No results found.  MUSCULOSKELETAL: Gait, strength, tone, movements noted in Neurologic exam below  NEUROLOGIC: MENTAL STATUS:      No data to display          awake, alert, oriented to person, place and time recent and remote memory intact normal attention and concentration language fluent, comprehension intact, naming intact fund of knowledge appropriate  CRANIAL NERVE:  2nd - no papilledema on fundoscopic exam 2nd, 3rd, 4th, 6th - pupils equal and reactive to light, visual fields full to confrontation, extraocular muscles intact, no nystagmus 5th - facial sensation symmetric 7th - facial strength symmetric 8th - hearing intact 9th - palate elevates symmetrically, uvula midline 11th - shoulder shrug symmetric 12th - tongue protrusion midline  MOTOR:  normal bulk and tone, full strength in the BUE, BLE  SENSORY:  normal and symmetric to light touch, temperature, vibration  COORDINATION:  finger-nose-finger, fine finger movements normal  REFLEXES:  deep tendon reflexes present and symmetric  GAIT/STATION:  narrow based gait     DIAGNOSTIC DATA (LABS, IMAGING, TESTING) - I reviewed patient records, labs, notes, testing and imaging myself where available.  Lab Results  Component Value Date   WBC 6.4 10/11/2021   HGB 13.7 10/11/2021   HCT 39.1 10/11/2021   MCV 84.4 10/11/2021   PLT 264 10/11/2021      Component Value Date/Time   NA 137 10/11/2021 2130   NA 139 07/28/2021 1039   K 4.4 10/11/2021 2130   CL 105 10/11/2021 2130   CO2 22 10/11/2021 2130   GLUCOSE 95 10/11/2021 2130   BUN 8 10/11/2021 2130   BUN 7 07/28/2021 1039   CREATININE 0.73 10/11/2021 2130   CREATININE 0.69 11/09/2018 1534   CALCIUM  9.7 10/11/2021 2130   PROT 8.2 (H) 10/11/2021 2130   PROT 7.5 07/28/2021  1039   ALBUMIN 4.3 10/11/2021 2130   ALBUMIN 4.6 07/28/2021 1039   AST 23 10/11/2021 2130   ALT 27 10/11/2021 2130   ALKPHOS 46 10/11/2021 2130   BILITOT 0.5 10/11/2021 2130   BILITOT 0.3 07/28/2021 1039   GFRNONAA >60 10/11/2021 2130   GFRAA <=90 11/18/2019 0000   Lab Results  Component Value Date   CHOL 136 07/28/2021   HDL 45  07/28/2021   LDLCALC 72 07/28/2021   LDLDIRECT 164.0 07/09/2020   TRIG 102 07/28/2021   CHOLHDL 3.0 07/28/2021   Lab Results  Component Value Date   HGBA1C 6.1 05/04/2021   Lab Results  Component Value Date   VITAMINB12 1,214 (H) 05/04/2021   Lab Results  Component Value Date   TSH 1.44 05/04/2021    03/29/23 EEG - This is a normal awake and drowsy EEG. No evidence of interictal epileptiform discharges. Normal EEGs, however, do not rule out epilepsy.   03/29/23 CT head  1. No CT evidence of intracranial injury. 2. 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  05/13/23 MRI of the brain with and without contrast shows the following: 2 punctate T2/FLAIR hyperintense foci in the left frontal lobe.  They do not enhance or appear to be acute.  They were present on the MRI from 07/04/2022 and 05/08/2020 and all consistent with negligible chronic microvascular ischemic change or sequela of migraine headache. No acute findings.  Normal enhancement pattern.   ASSESSMENT AND PLAN  43 y.o. year old female here with:   Meds tried: sumatriptan , rizatriptan , ajovy , emgality, amitriptyline   Dx:  1. Migraine with aura and without status migrainosus, not intractable      PLAN:  MIGRAINE WITH AURA  MIGRAINE TREATMENT PLAN:  MIGRAINE PREVENTION  LIFESTYLE CHANGES -Stop or avoid smoking -Decrease or avoid caffeine / alcohol -Eat and sleep on a regular schedule -Exercise several times per week - continue vyepti 300mg  IV every 3 months (since ~April 2025) - start topiramate 50mg  at bedtime; after 1-2 weeks increase to 50mg  twice a day; drink plenty of water   MIGRAINE RESCUE  - ibuprofen , tylenol  as needed - continue rizatriptan  (Maxalt ) 10mg  as needed for breakthrough headache; may repeat x 1 after 2 hours; max 2 tabs per day or 8 per month - start rimegepant (Nurtec) 75mg  as needed for breakthrough headache; max 8 per month   RECURRENT SYNCOPE (starting in childhood; last  event ~ Feb 2025; happens every ~7-8 years) - EEG, CT, MRI unremarkable - follow up with PCP  Meds ordered this encounter  Medications   topiramate (TOPAMAX) 50 MG tablet    Sig: Take 1 tablet (50 mg total) by mouth 2 (two) times daily.    Dispense:  60 tablet    Refill:  12   Rimegepant Sulfate (NURTEC) 75 MG TBDP    Sig: Take 1 tablet (75 mg total) by mouth daily as needed.    Dispense:  8 tablet    Refill:  6   Return in about 9 months (around 08/15/2024) for with NP Johnnie Russell), MyChart visit (15 min).    EDUARD FABIENE HANLON, MD 11/16/2023, 3:44 PM Certified in Neurology, Neurophysiology and Neuroimaging  [facility] [address] [place], [place] [address] [phone]

## 2023-11-17 ENCOUNTER — Telehealth: Payer: Self-pay | Admitting: Pharmacist

## 2023-11-17 NOTE — Telephone Encounter (Signed)
 Pharmacy Patient Advocate Encounter  Received notification from [facility] that Prior Authorization for Nurtec has been APPROVED from 02/01/2023 to 11/16/2024   PA #/Case ID/Reference #: [id number]

## 2023-11-17 NOTE — Telephone Encounter (Signed)
 Pharmacy Patient Advocate Encounter   Received notification from Patient Pharmacy that prior authorization for Nurtec 75MG  dispersible tablets is required/requested.   Insurance verification completed.   The patient is insured through [facility].   Per test claim: PA required; PA submitted to above mentioned insurance via CoverMyMeds Key/confirmation #/EOC [id number] Status is pending

## 2023-11-20 ENCOUNTER — Other Ambulatory Visit (HOSPITAL_COMMUNITY): Payer: Self-pay

## 2024-01-01 ENCOUNTER — Telehealth: Payer: Self-pay

## 2024-01-01 ENCOUNTER — Telehealth (HOSPITAL_BASED_OUTPATIENT_CLINIC_OR_DEPARTMENT_OTHER): Payer: Self-pay

## 2024-01-01 NOTE — Telephone Encounter (Signed)
   Pre-operative Risk Assessment    Patient Name: Melissa [name]  DOB: 1981-01-14 MRN: [id number]   Date of last office visit: 02/11/22 with Dr. Raford  Date of next office visit: NA  Request for Surgical Clearance    Procedure:  Colonoscopy  Date of Surgery:  Clearance 01/04/24                                 Surgeon:  Dr. Amber Surgeon's Group or Practice Name:  [facility] Phone number:  [phone] Fax number:  [phone]   Type of Clearance Requested:   - Medical    Type of Anesthesia:  Not Indicated   Additional requests/questions:    Melissa [name]   01/01/2024, 11:10 AM

## 2024-01-01 NOTE — Telephone Encounter (Signed)
 Called the patient and left a detailed message and requested for her to call back ASAP, due to her procedure on 01/04/24.

## 2024-01-01 NOTE — Telephone Encounter (Signed)
   Name: Melissa [name]  DOB: 1980-12-08  MRN: [id number]  Primary Cardiologist: Raford  Chart reviewed as part of pre-operative protocol coverage. Because of Melissa [name]'s past medical history and time since last visit, she will require a follow-up in-office visit ASAP in order to better assess preoperative cardiovascular risk. Last seen by Dr. Raford on 01/31/2022.  Pre-op covering staff: - Please schedule appointment and call patient to inform them. If patient already had an upcoming appointment within acceptable timeframe, please add pre-op clearance to the appointment notes so provider is aware. - Please contact requesting surgeon's office via preferred method (i.e, phone, fax) to inform them of need for appointment prior to surgery.  Patient is not on anticoagulation or antiplatelet per review of current medical record in Epic.    Lamarr Satterfield, NP  01/01/2024, 11:30 AM

## 2024-01-01 NOTE — Telephone Encounter (Signed)
 Pt scheduled tomorrow at 9:40 with Emelia, NP

## 2024-01-02 ENCOUNTER — Encounter: Payer: Self-pay | Admitting: General Practice

## 2024-01-02 ENCOUNTER — Ambulatory Visit: Attending: General Practice | Admitting: Emergency Medicine

## 2024-01-02 VITALS — BP 112/84 | HR 87 | Ht 67.0 in | Wt 171.6 lb

## 2024-01-02 DIAGNOSIS — I2510 Atherosclerotic heart disease of native coronary artery without angina pectoris: Secondary | ICD-10-CM | POA: Insufficient documentation

## 2024-01-02 DIAGNOSIS — I10 Essential (primary) hypertension: Secondary | ICD-10-CM | POA: Diagnosis present

## 2024-01-02 DIAGNOSIS — Z01810 Encounter for preprocedural cardiovascular examination: Secondary | ICD-10-CM | POA: Insufficient documentation

## 2024-01-02 DIAGNOSIS — E785 Hyperlipidemia, unspecified: Secondary | ICD-10-CM | POA: Diagnosis present

## 2024-01-02 NOTE — Patient Instructions (Signed)
 Medication Instructions:  Your physician recommends that you continue on your current medications as directed. Please refer to the Current Medication list given to you today. *If you need a refill on your cardiac medications before your next appointment, please call your pharmacy*  Lab Work: None ordered If you have labs (blood work) drawn today and your tests are completely normal, you will receive your results only by: MyChart Message (if you have MyChart) OR A paper copy in the mail If you have any lab test that is abnormal or we need to change your treatment, we will call you to review the results.  Testing/Procedures: None ordered  Follow-Up: At [facility], you and your health needs are our priority.  As part of our continuing mission to provide you with exceptional heart care, our providers are all part of one team.  This team includes your primary Cardiologist (physician) and Advanced Practice Providers or APPs (Physician Assistants and Nurse Practitioners) who all work together to provide you with the care you need, when you need it.  Your next appointment:   FOLLOW UP AS NEEDED   Provider:   ANY APP   We recommend signing up for the patient portal called MyChart.  Sign up information is provided on this After Visit Summary.  MyChart is used to connect with patients for Virtual Visits (Telemedicine).  Patients are able to view lab/test results, encounter notes, upcoming appointments, etc.  Non-urgent messages can be sent to your provider as well.   To learn more about what you can do with MyChart, go to [email or web address].   Other Instructions YOU CAN PROCEED WITH YOUR UPCOMING PROCEDURE

## 2024-01-02 NOTE — Progress Notes (Signed)
 Cardiology Clinic Note   Patient Name: Melissa [name] Date of Encounter: 01/02/2024  Primary Care Provider:  Ilah Crigler, MD Primary Cardiologist:  None  Patient Profile    Melissa [name] 43 year old female presents to the clinic today for follow-up evaluation of her hyperlipidemia, coronary artery disease, and preoperative cardiac evaluation.  Past Medical History    Past Medical History:  Diagnosis Date   Asthma    Chest pain of uncertain etiology 06/08/2021   Elevated blood pressure reading 02/11/2022   Fibromyalgia    IBS (irritable bowel syndrome)    Migraine    Pure hypercholesterolemia 06/08/2021   Past Surgical History:  Procedure Laterality Date   [facility]  2008   fluid removal     left lung   WISDOM TOOTH EXTRACTION      Allergies  Allergies  Allergen Reactions   Aspirin Shortness Of Breath    Other Reaction(s): Not available   Tomato Anaphylaxis    Other Reaction(s): Not available   Iodinated Contrast Media Hives   Latex Itching    Other Reaction(s): Not available   Other Itching    Hives Vitamin E Analogues   Penicillins    Vitamin E Hives    Other Reaction(s): Not available    History of Present Illness    Melissa [name] has a PMH of chronic migraine, allergic rhinitis, asthma, OSA on CPAP, hyperglycemia, hyperlipidemia, fibromyalgia, IBS, and elevated blood pressure readings.  She was initially seen by cardiology 05/2021 for evaluation of chest pain.  She saw Dr. Kennyth 4/23 and reported sharp chest pain that was occurring frequently.  Her chest pain was felt to be atypical and related to her fibromyalgia.  She reported a family history of prediabetes and dyslipidemia.  She was referred to general cardiology for further evaluation.  She also noted bilateral leg pain which was felt to be related to neuropathic pain.  ABIs were ordered and were negative.   Due to her contrast allergy a calcium  scoring was ordered and showed a value was 7.  Her  nuclear stress test showed low risk.  She was seen in follow-up by Dr. Raford on 02/11/2022. She noted no recurrent chest pain.  Her blood pressure was noted to be 124/92.  On recheck it was 132/88.  She reported that she was not monitoring her blood pressure at home due to chronic pain.  At times she noted bilateral hand pain.  She had not been very active.  She was spending most of her time in bed.  She had tried to exercise but developed flares of fibromyalgia.  Her medication regimen was reviewed.  She also reported lower extremity swelling 3 days out of the week.  She noted that the swelling would occur in the afternoons.  She was wearing knee-high compression stockings which were helping with her lower extremity swelling.  She denied palpitations, chest pain, shortness of breath, lightheadedness, syncope, orthopnea and PND.  She presents to the clinic today for follow-up evaluation and preoperative cardiac evaluation for upcoming colonoscopy on 01/04/2024. She denies chest pain, dyspnea, orthopnea, n, v, dark/tarry/bloody stools, hematuria, edema, weight gain.  Reports a history of syncope previously found to be neuromediated and related to her migraines, last episode of syncope 4-6 months ago. Reports occasional vertigo/dizziness when having a migraine, relieved by meclizine . States she had palpitations in the past, but they have mostly resolved. States now she may feel a few extra beats once a month. Patient appears very  frustrated and curt in her responses. She's curious as to why she's having to be seen in our office today after my primary care doctor told me I was cleared for surgery. I don't have any heart problems. She is limited in her physical activity secondary to her debilitating fibromyalgia and has been for years.    Home Medications    Prior to Admission medications   Medication Sig Start Date End Date Taking? Authorizing Provider  albuterol  (PROVENTIL ) (2.5 MG/3ML) 0.083% nebulizer  solution Take 3 mLs (2.5 mg total) by nebulization every 6 (six) hours as needed for wheezing or shortness of breath. 01/03/19   Kennyth Worth HERO, MD  albuterol  (VENTOLIN  HFA) 108 [phone] Base) MCG/ACT inhaler TAKE 2 PUFFS BY MOUTH EVERY 6 HOURS AS NEEDED FOR WHEEZE OR SHORTNESS OF BREATH 05/04/21   Kennyth Worth HERO, MD  Ascorbic Acid (VITAMIN C) 1000 MG tablet Take 1,000 mg by mouth daily.    [provider]  CHLOROPHYLL PO Take by mouth.    [provider]  cholecalciferol (VITAMIN D ) 1000 units tablet Take 1,000 Units by mouth daily.    [provider]  COD LIVER OIL PO Take by mouth.    [provider]  Cyanocobalamin (VITAMIN B-12 PO)     [provider]  cyclobenzaprine  (FLEXERIL ) 10 MG tablet TAKE 1 TABLET BY MOUTH THREE TIMES A DAY AS NEEDED FOR MUSCLE SPASMS 11/10/21   Tysinger, Alm RAMAN, PA-C  dicyclomine  (BENTYL ) 20 MG tablet TAKE 1 TABLET BY MOUTH 4 TIMES DAILY - BEFORE MEALS AND AT BEDTIME. 08/18/21   Kennyth Worth HERO, MD  Eptinezumab-jjmr (VYEPTI IV) Inject 300 mg into the vein every 3 (three) months.    [provider]  escitalopram  (LEXAPRO ) 20 MG tablet TAKE 1 TABLET BY MOUTH EVERY DAY 06/04/21   Kennyth Worth HERO, MD  lubiprostone  (AMITIZA ) 8 MCG capsule TAKE 1 CAPSULE (8 MCG TOTAL) BY MOUTH 2 (TWO) TIMES DAILY WITH A MEAL. 07/07/23   May, Deanna J, NP  meclizine  (ANTIVERT ) 25 MG tablet TAKE 1 TABLET (25 MG TOTAL) BY MOUTH 2 (TWO) TIMES DAILY AS NEEDED FOR DIZZINESS. 11/09/21   Kennyth Worth HERO, MD  Multiple Vitamin (MULTIVITAMIN) tablet Take 1 tablet by mouth daily.    [provider]  Omega-3 Fatty Acids (FISH OIL) 1000 MG CAPS Take by mouth.    [provider]  ondansetron  (ZOFRAN -ODT) 4 MG disintegrating tablet Take 1 tablet (4 mg total) by mouth every 8 (eight) hours as needed for nausea or vomiting. 01/19/23   Ines Onetha NOVAK, MD  pantoprazole  (PROTONIX ) 40 MG tablet TAKE 1 TABLET BY MOUTH EVERY DAY 08/09/21   Kennyth Worth HERO, MD  pregabalin  (LYRICA ) 100 MG capsule Take 100 mg by mouth 3 (three) times daily. 07/02/22   [provider]  promethazine  (PHENERGAN ) 12.5 MG tablet Take 1 tablet (12.5 mg total) by mouth every 8 (eight) hours as needed for nausea or vomiting. 07/30/21   Williams, Lynne B, PA-C  Riboflavin (B-2-400 PO) Take by mouth.    [provider]  Rimegepant Sulfate (NURTEC) 75 MG TBDP Take 1 tablet (75 mg total) by mouth daily as needed. 11/16/23   Penumalli, Eduard SAUNDERS, MD  rizatriptan  (MAXALT -MLT) 10 MG disintegrating tablet Take 1 tablet (10 mg total) by mouth as needed for migraine. May repeat in 2 hours if needed 01/19/23   Ines Onetha NOVAK, MD  rosuvastatin  (CRESTOR ) 20 MG tablet TAKE 1 TABLET BY MOUTH EVERY DAY 10/24/23  Raford Riggs, MD  sodium chloride  0.9 % SOLN 100 mL with Eptinezumab-jjmr 100 MG/ML SOLN 100 mg Inject 100 mg into the vein every 3 (three) months. 07/06/23   Ines Onetha NOVAK, MD  topiramate  (TOPAMAX ) 50 MG tablet Take 1 tablet (50 mg total) by mouth 2 (two) times daily. 11/16/23   Penumalli, Vikram R, MD  traMADol (ULTRAM) 50 MG tablet Take by mouth every 6 (six) hours as needed.    [provider]  zolpidem  (AMBIEN ) 10 MG tablet Take 1 tablet (10 mg total) by mouth at bedtime as needed for sleep. 09/07/21   Joyce Norleen BROCKS, MD    Family History    Family History  Problem Relation Age of Onset   Heart disease Mother    Arthritis Mother    Diabetes Mother    High Cholesterol Mother    Migraines Mother    Colon polyps Mother    Cancer Father        GIST tumor   High blood pressure Father    High Cholesterol Sister    High blood pressure Sister    Breast cancer Maternal Aunt    Kidney disease Maternal Grandmother    Heart disease Maternal Grandfather    Cancer Maternal Grandfather    High Cholesterol Paternal Grandmother    High blood pressure Paternal Grandmother    High blood pressure Paternal Grandfather    She indicated that her mother  is alive. She indicated that her father is deceased. She indicated that the status of her sister is unknown. She indicated that the status of her maternal grandmother is unknown. She indicated that the status of her maternal grandfather is unknown. She indicated that the status of her paternal grandmother is unknown. She indicated that the status of her paternal grandfather is unknown. She indicated that the status of her maternal aunt is unknown.  Social History    Social History   Socioeconomic History   Marital status: Single    Spouse name: Not on file   Number of children: 0   Years of education: Not on file   Highest education level: Bachelor's degree (e.g., BA, AB, BS)  Occupational History   Occupation: Marine Scientist: [employer]  Tobacco Use   Smoking status: Former    Types: Cigars    Quit date: 01/01/2018    Years since quitting: 6.0   Smokeless tobacco: Never   Tobacco comments:    Black and Milds  Vaping Use   Vaping status: Never Used  Substance and Sexual Activity   Alcohol use: Not Currently    Comment: 1 beer qod   Drug use: Yes    Types: Marijuana    Comment: uses CBD   Sexual activity: Not Currently  Other Topics Concern   Not on file  Social History Narrative   Patient is right-handed. She lives in a single level home. Her mother lives with her. She does not exercise.   Caffeine: Kombucha   Social Drivers of Corporate Investment Banker Strain: Low Risk  (06/08/2021)   Overall Financial Resource Strain (CARDIA)    Difficulty of Paying Living Expenses: Not hard at all  Food Insecurity: No Food Insecurity (06/08/2021)   Hunger Vital Sign    Worried About Running Out of Food in the Last Year: Never true    Ran Out of Food in the Last Year: Never true  Transportation Needs: No Transportation Needs (06/08/2021)   PRAPARE - Transportation  Lack of Transportation (Medical): No    Lack of Transportation (Non-Medical): No  Physical Activity: Inactive  (06/08/2021)   Exercise Vital Sign    Days of Exercise per Week: 0 days    Minutes of Exercise per Session: 0 min  Stress: Not on file  Social Connections: Not on file  Intimate Partner Violence: Not on file     Review of Systems    General:  No chills, fever, night sweats or weight changes.  Cardiovascular:  No chest pain, dyspnea on exertion, edema, orthopnea, paroxysmal nocturnal dyspnea. Dermatological: No rash, lesions/masses Respiratory: No cough, dyspnea Urologic: No hematuria, dysuria Abdominal:   No nausea, vomiting, diarrhea, bright red blood per rectum, melena, or hematemesis Neurologic:  No visual changes, wkns, changes in mental status. All other systems reviewed and are otherwise negative except as noted above.  Physical Exam    VS:  BP 112/84   Pulse 87   Ht 5' 7 (1.702 m)   Wt 171 lb 9.6 oz (77.8 kg)   SpO2 99%   BMI 26.88 kg/m  , BMI Body mass index is 26.88 kg/m. GEN: Well nourished, well developed, in no acute distress. HEENT: normal. Neck: Supple, no JVD, carotid bruits, or masses. Cardiac: RRR, no murmurs, rubs, or gallops. No clubbing, cyanosis, edema.  Respiratory:  Respirations regular and unlabored, clear to auscultation bilaterally. Skin: warm and dry, no rash. Psych: Appears frustrated  Accessory Clinical Findings    Recent Labs: No results found for requested labs within last 365 days.   Recent Lipid Panel    Component Value Date/Time   CHOL 136 07/28/2021 1039   TRIG 102 07/28/2021 1039   HDL 45 07/28/2021 1039   CHOLHDL 3.0 07/28/2021 1039   CHOLHDL 5 07/09/2020 1201   VLDL 42.2 (H) 07/09/2020 1201   LDLCALC 72 07/28/2021 1039   LDLCALC 170 (H) 11/09/2018 1534   LDLDIRECT 164.0 07/09/2020 1201         ECG personally reviewed by me today- EKG Interpretation Date/Time:  Tuesday January 02 2024 09:21:55 EST Ventricular Rate:  87 PR Interval:  186 QRS Duration:  82 QT Interval:  372 QTC Calculation: 447 R Axis:   6  Text  Interpretation: Normal sinus rhythm Minimal voltage criteria for LVH, may be normal variant ( R in aVL ) Cannot rule out Anterior infarct , age undetermined Poor R wave progression Inverted T wave lead III Confirmed by Winnie Umali [id number]) on 01/02/2024 9:29:11 AM    Coronary calcium  scoring 07/07/2021   FINDINGS: Coronary arteries: Normal origins.   Coronary Calcium  Score:   Left main: 0   Left anterior descending artery: 0   Left circumflex artery: 0   Right coronary artery: 7   Total: 7   Percentile: NA--outside age window   Pericardium: Normal.   Aorta: Normal caliber of ascending aorta. Aortic arch atherosclerosis noted.   Non-cardiac: See separate report from [facility].   IMPRESSION: Coronary calcium  score of 7. This was N/A percentile for age-, race-, and sex-matched controls (age range starts at 47). Mild aortic arch atherosclerosis.   RECOMMENDATIONS: Coronary artery calcium  (CAC) score is a strong predictor of incident coronary heart disease (CHD) and provides predictive information beyond traditional risk factors. CAC scoring is reasonable to use in the decision to withhold, postpone, or initiate statin therapy in intermediate-risk or selected borderline-risk asymptomatic adults (age 43-75 years and LDL-C >=70 to <190 mg/dL) who do not have diabetes or established atherosclerotic cardiovascular disease (ASCVD).* In intermediate-risk (  10-year ASCVD risk >=7.5% to <20%) adults or selected borderline-risk (10-year ASCVD risk >=5% to <7.5%) adults in whom a CAC score is measured for the purpose of making a treatment decision the following recommendations have been made:   If CAC=0, it is reasonable to withhold statin therapy and reassess in 5 to 10 years, as long as higher risk conditions are absent (diabetes mellitus, family history of premature CHD in first degree relatives (males <55 years; females <65 years), cigarette smoking, or LDL >=190  mg/dL).   If CAC is 1 to 99, it is reasonable to initiate statin therapy for patients >=10 years of age.    Assessment & Plan   -Preoperative cardiovascular evaluation According to the Revised Cardiac Risk Index (RCRI), her Perioperative Risk of Major Cardiac Event is (%): 0.4  Her Functional Capacity in METs is: 4.64 according to the Duke Activity Status Index (DASI). Therefore, based on ACC/AHA guidelines, patient would be at acceptable risk for the planned procedure without further cardiovascular testing. Given her lower normal functional capacity and overall reduced activity intolerance, I do believe she would be of moderate risk going into this low risk procedure.  Patient was advised that if she develops new symptoms prior to surgery to contact our office to arrange a follow-up appointment.    I will route this recommendation to the requesting party via Epic fax function and remove from pre-op pool.  -Coronary artery disease-no chest pain today.  Had coronary calcium  scoring 6/23 which showed a total coronary calcium  score of 7. Continue rosuvastatin , omega-3 fatty acids Recommended increased physical activity as tolerated  -Hyperlipidemia. High-fiber diet Continue rosuvastatin , omega-3 fatty acids  -Elevated blood pressure readings-BP today 112/84. Managed by PCP Continue current medical therapy Continue to monitor  Disposition: Follow-up with MD or APP in 12 months or sooner if needed

## 2024-01-03 ENCOUNTER — Telehealth: Payer: Self-pay | Admitting: Cardiovascular Disease

## 2024-01-03 NOTE — Telephone Encounter (Signed)
   Patient called answering service and stated that her procedure got cancelled due to her not being cleared. She is upset and would like a callback.

## 2024-01-04 NOTE — Telephone Encounter (Signed)
 This is correct. She was cleared on 01/02/2024. I have personally reviewed the note. I will resend the note to GI for documentation purposes.

## 2024-01-04 NOTE — Telephone Encounter (Signed)
 I have reviewed the ov notes from Miriam Shams, NP which reflects that the pt was cleared by cardiology. I will have preop APP review this as well, see notes the pt is upset that her procedure was cancelled  due to she was not cleared. Though I see she was cleared.

## 2024-01-19 ENCOUNTER — Other Ambulatory Visit (HOSPITAL_BASED_OUTPATIENT_CLINIC_OR_DEPARTMENT_OTHER): Payer: Self-pay | Admitting: Cardiovascular Disease

## 2024-02-06 ENCOUNTER — Other Ambulatory Visit: Payer: Self-pay

## 2024-02-06 DIAGNOSIS — G43711 Chronic migraine without aura, intractable, with status migrainosus: Secondary | ICD-10-CM

## 2024-02-06 MED ORDER — RIZATRIPTAN BENZOATE 10 MG PO TBDP: 10.0000 mg | ORAL_TABLET | ORAL | 11 refills | Status: AC | PRN

## 2024-08-21 ENCOUNTER — Ambulatory Visit: Admitting: Adult Health
